# Patient Record
Sex: Female | Born: 1970 | Race: White | Hispanic: No | Marital: Married | Smoking: Current every day smoker
Health system: Southern US, Community
[De-identification: ages and names within clinical notes are randomized; demographics above are authoritative.]

## PROBLEM LIST (undated history)

## (undated) ENCOUNTER — Emergency Department (HOSPITAL_COMMUNITY): Admission: EM | Payer: Managed Care, Other (non HMO)

## (undated) DIAGNOSIS — Z87442 Personal history of urinary calculi: Secondary | ICD-10-CM

## (undated) DIAGNOSIS — F419 Anxiety disorder, unspecified: Secondary | ICD-10-CM

## (undated) DIAGNOSIS — I5022 Chronic systolic (congestive) heart failure: Secondary | ICD-10-CM

## (undated) DIAGNOSIS — Z720 Tobacco use: Secondary | ICD-10-CM

## (undated) DIAGNOSIS — F32A Depression, unspecified: Secondary | ICD-10-CM

## (undated) DIAGNOSIS — I10 Essential (primary) hypertension: Secondary | ICD-10-CM

## (undated) DIAGNOSIS — R011 Cardiac murmur, unspecified: Secondary | ICD-10-CM

## (undated) DIAGNOSIS — I5181 Takotsubo syndrome: Secondary | ICD-10-CM

## (undated) DIAGNOSIS — M069 Rheumatoid arthritis, unspecified: Secondary | ICD-10-CM

## (undated) DIAGNOSIS — M66239 Spontaneous rupture of extensor tendons, unspecified forearm: Secondary | ICD-10-CM

## (undated) DIAGNOSIS — I2510 Atherosclerotic heart disease of native coronary artery without angina pectoris: Secondary | ICD-10-CM

## (undated) DIAGNOSIS — I77810 Thoracic aortic ectasia: Secondary | ICD-10-CM

## (undated) DIAGNOSIS — M66249 Spontaneous rupture of extensor tendons, unspecified hand: Secondary | ICD-10-CM

## (undated) HISTORY — PX: OTHER SURGICAL HISTORY: SHX169

## (undated) HISTORY — PX: CARDIAC VALVE REPLACEMENT: SHX585

## (undated) HISTORY — DX: Thoracic aortic ectasia: I77.810

## (undated) HISTORY — DX: Atherosclerotic heart disease of native coronary artery without angina pectoris: I25.10

## (undated) HISTORY — DX: Takotsubo syndrome: I51.81

## (undated) HISTORY — DX: Anxiety disorder, unspecified: F41.9

## (undated) HISTORY — PX: CARDIAC CATHETERIZATION: SHX172

## (undated) HISTORY — DX: Chronic systolic (congestive) heart failure: I50.22

## (undated) HISTORY — DX: Cardiac murmur, unspecified: R01.1

## (undated) HISTORY — DX: Rheumatoid arthritis, unspecified: M06.9

## (undated) HISTORY — PX: TUBAL LIGATION: SHX77

---

## 2005-10-01 ENCOUNTER — Emergency Department: Payer: Self-pay | Admitting: Emergency Medicine

## 2005-10-01 ENCOUNTER — Other Ambulatory Visit: Payer: Self-pay

## 2009-04-21 HISTORY — PX: CYSTOSCOPY WITH URETEROSCOPY, STONE BASKETRY AND STENT PLACEMENT: SHX6378

## 2009-10-01 ENCOUNTER — Observation Stay: Payer: Self-pay | Admitting: Urology

## 2009-10-08 ENCOUNTER — Ambulatory Visit: Payer: Self-pay | Admitting: Urology

## 2009-10-11 ENCOUNTER — Ambulatory Visit: Payer: Self-pay | Admitting: Urology

## 2009-10-24 ENCOUNTER — Ambulatory Visit: Payer: Self-pay | Admitting: General Practice

## 2009-10-31 ENCOUNTER — Ambulatory Visit: Payer: Self-pay | Admitting: General Practice

## 2009-11-14 ENCOUNTER — Ambulatory Visit: Payer: Self-pay | Admitting: General Practice

## 2011-10-06 IMAGING — CT CT STONE STUDY
1 of 2 series · 15 of 32 positions shown, 19 images · non-contrast
Comparison: none

REASON FOR EXAM: Rt. flank pain severe with difficulty urinating
COMMENTS:

[Series 2: stone · axial · 0.59mm/px · z∈[-467,-107]mm · 15 of 135 slices shown, 19 images]
[im 10/135  soft-tissue]
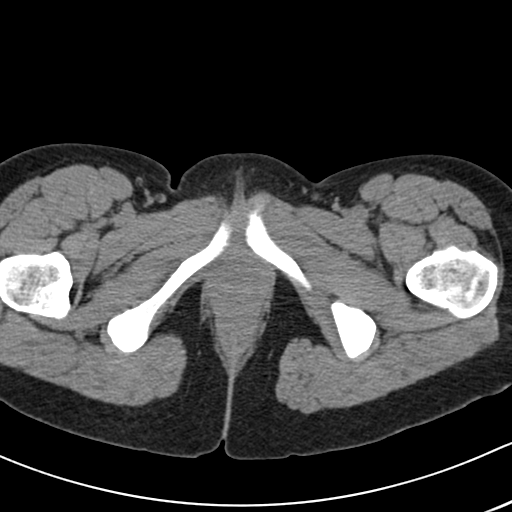
[im 10/135  bone]
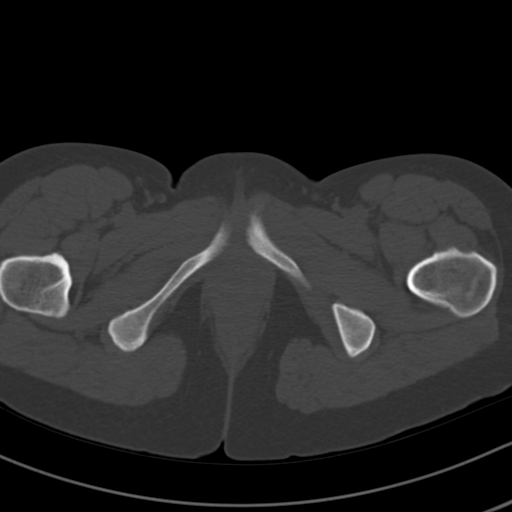
[im 20/135  soft-tissue]
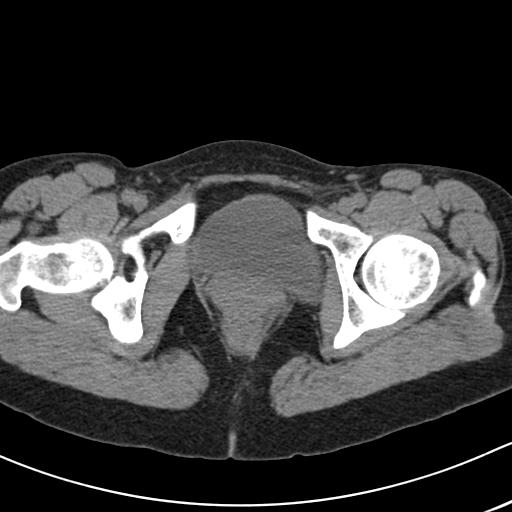
[im 29/135  soft-tissue]
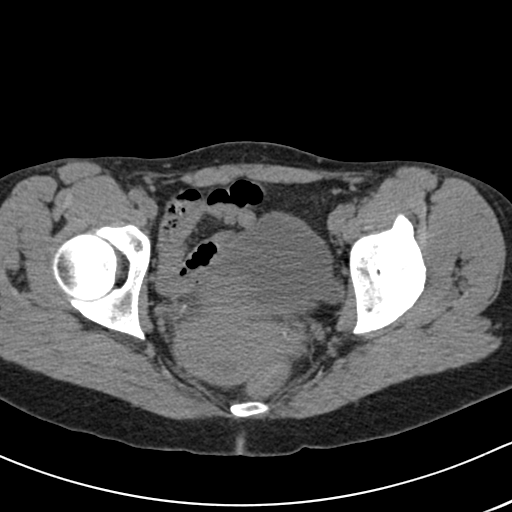
[im 39/135  soft-tissue]
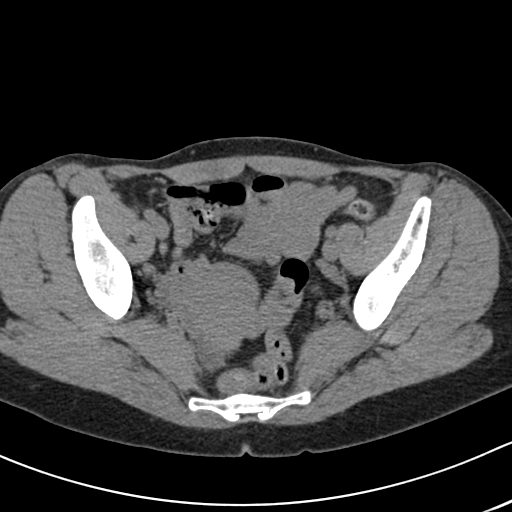
[im 48/135  soft-tissue]
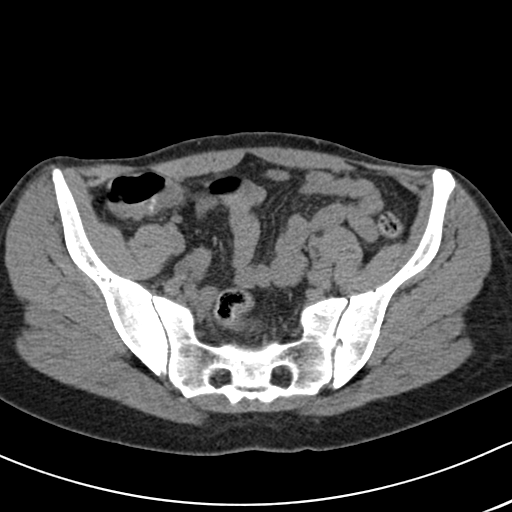
[im 58/135  soft-tissue]
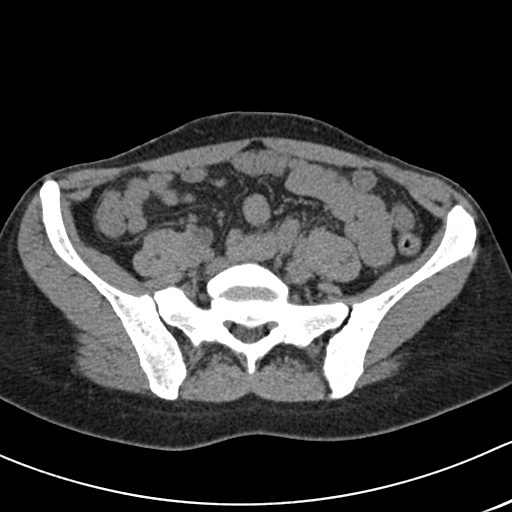
[im 68/135  soft-tissue]
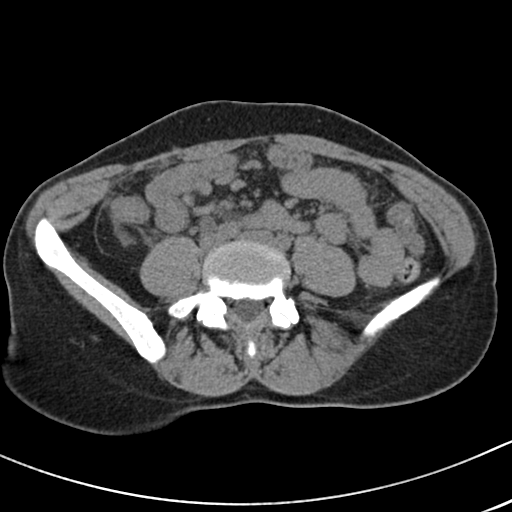
[im 77/135  soft-tissue]
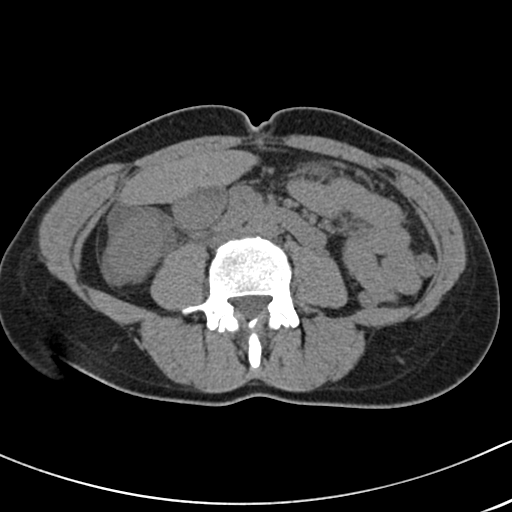
[im 87/135  soft-tissue]
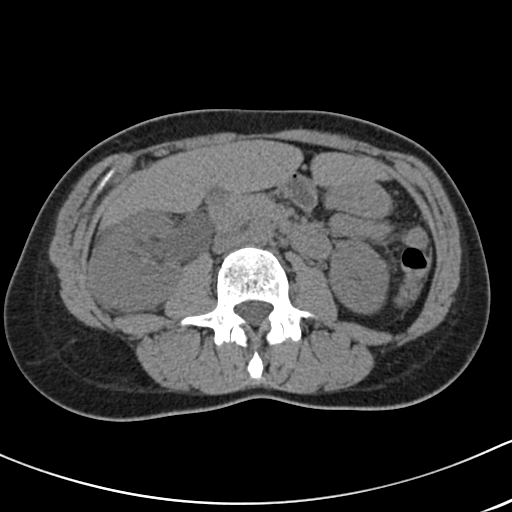
[im 87/135  bone]
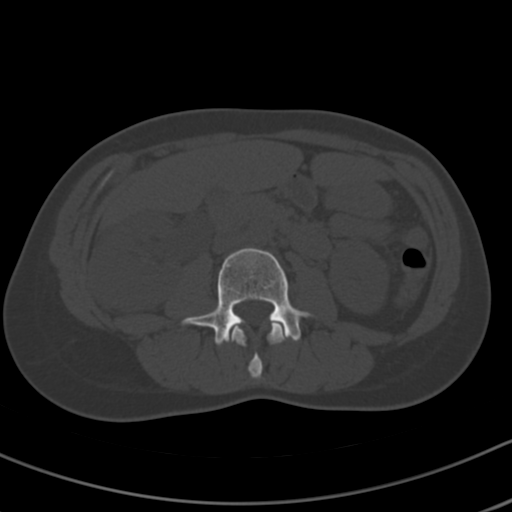
[im 96/135  soft-tissue]
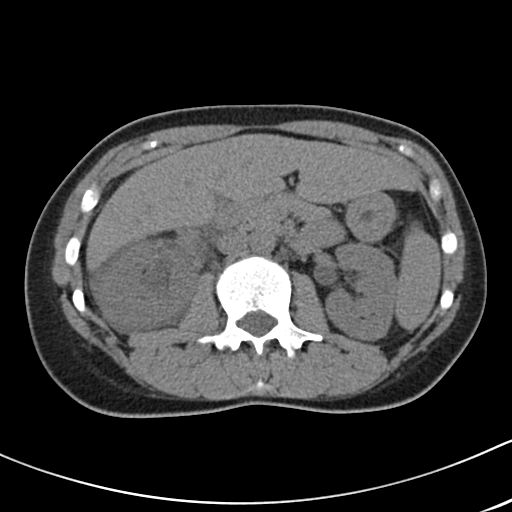
[im 106/135  soft-tissue]
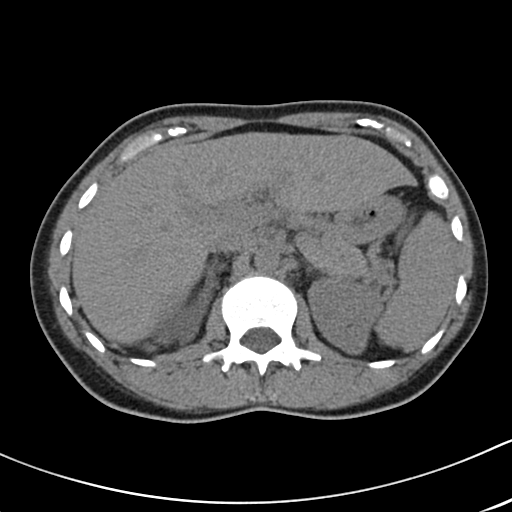
[im 115/135  soft-tissue]
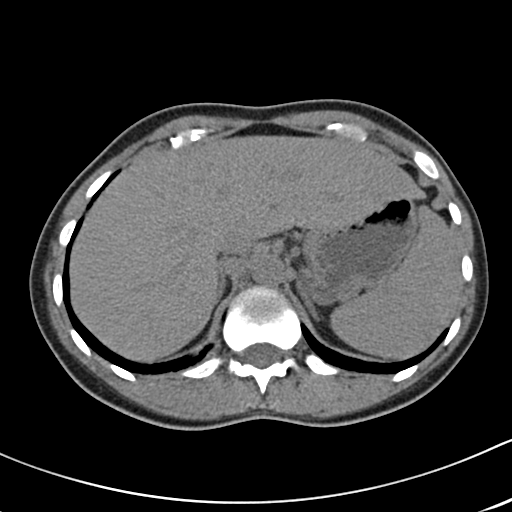
[im 115/135  lung]
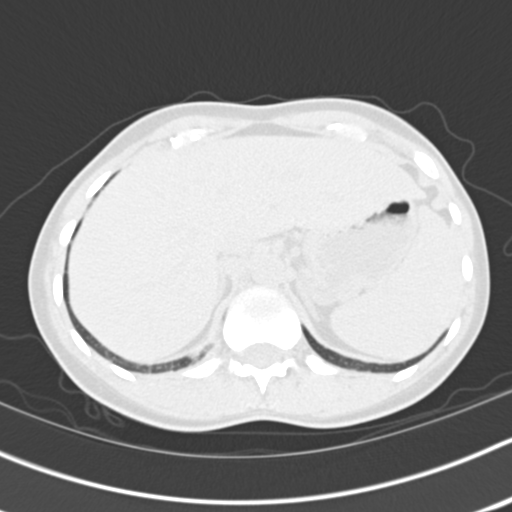
[im 120/135  lung]
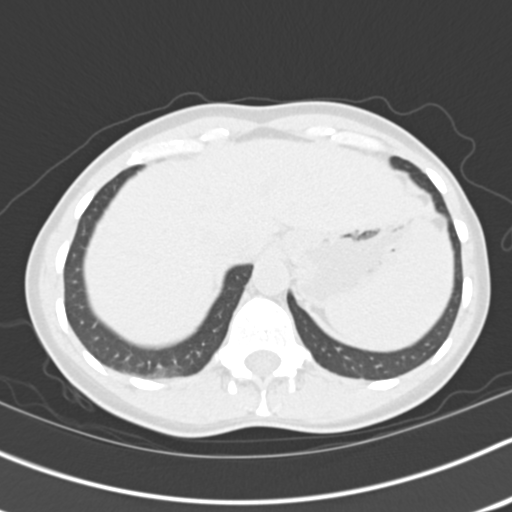
[im 125/135  soft-tissue]
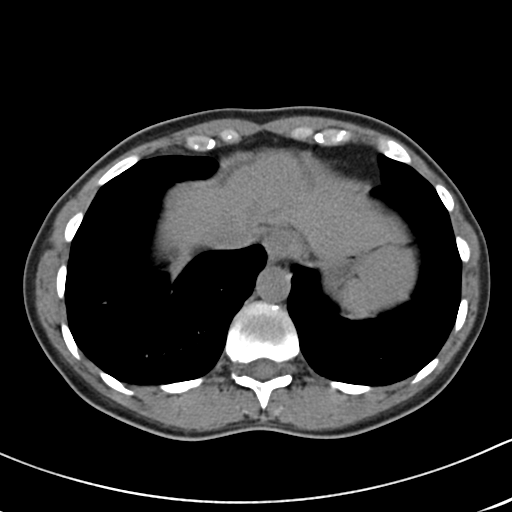
[im 125/135  lung]
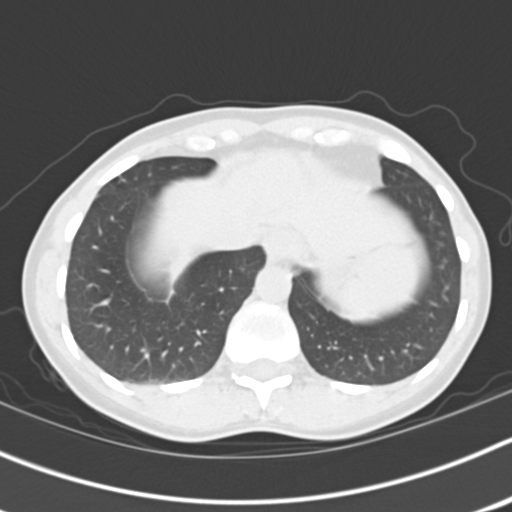
[im 130/135  lung]
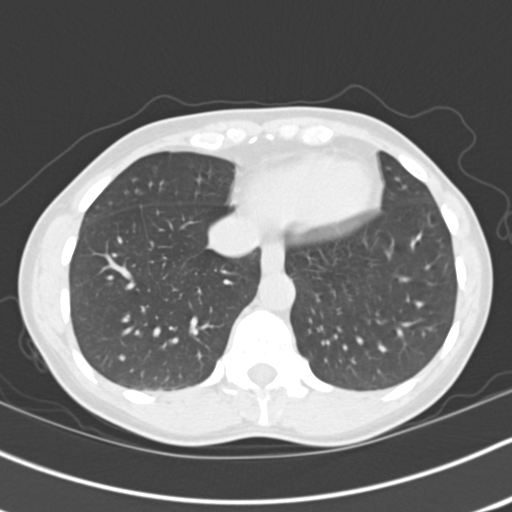

[15 of 32 positions shown; findings below may reference images not displayed]

PROCEDURE:     CT  - CT ABDOMEN /PELVIS WO (STONE)  - September 30, 2009 [DATE]

RESULT:     Axial noncontrast CT scanning was performed through the abdomen
and pelvis at 3 mm intervals and slice thicknesses.

On the right there is moderate to severe hydronephrosis. There is increased
density in the perinephric fat with evidence of fluid. The right ureter is
moderately dilated to the level of the ureterovesical junction. There is a
calcified stone just proximal to the ureterovesical junction demonstrated
best on image 108. This stone measures 3 x 6 mm. The urinary bladder is
partially distended and grossly normal.

The left kidney exhibits no evidence of obstruction or stones. The liver,
spleen, pancreas, partially distended stomach, and adrenal glands are normal
in appearance for the noncontrast study. The caliber of the abdominal aorta
is normal. The unopacified loops of small and large bowel exhibit no
evidence of obstruction. There is a structure demonstrated which may reflect
a normal appendix in the right aspect of the pelvis. The uterus and adnexal
structures appear within the limits of normal for age. I cannot exclude
cysts in the left ovary. The lung bases are clear. The lumbar vertebral
bodies are preserved in height.
IMPRESSION: 1. There is moderate to severe hydronephrosis and hydroureter on the right
secondary to a 6 mm diameter stone just proximal to the right ureterovesical
junction. There is perinephric fluid on the right. I see no stones or
obstruction on the left.
2. I see no acute hepatobiliary abnormality nor acute bowel abnormality.

A preliminary report was sent to the [HOSPITAL] the conclusion
of the study.

## 2011-10-17 IMAGING — CR DG ABDOMEN 1V
1 series · 2 of 2 positions shown · non-contrast
Comparison: none

REASON FOR EXAM: renal calculi-lithotripsy
COMMENTS:

[Series 1: view not recorded · 0.17mm/px · 2 of 2 slices shown]
[im 1/2]
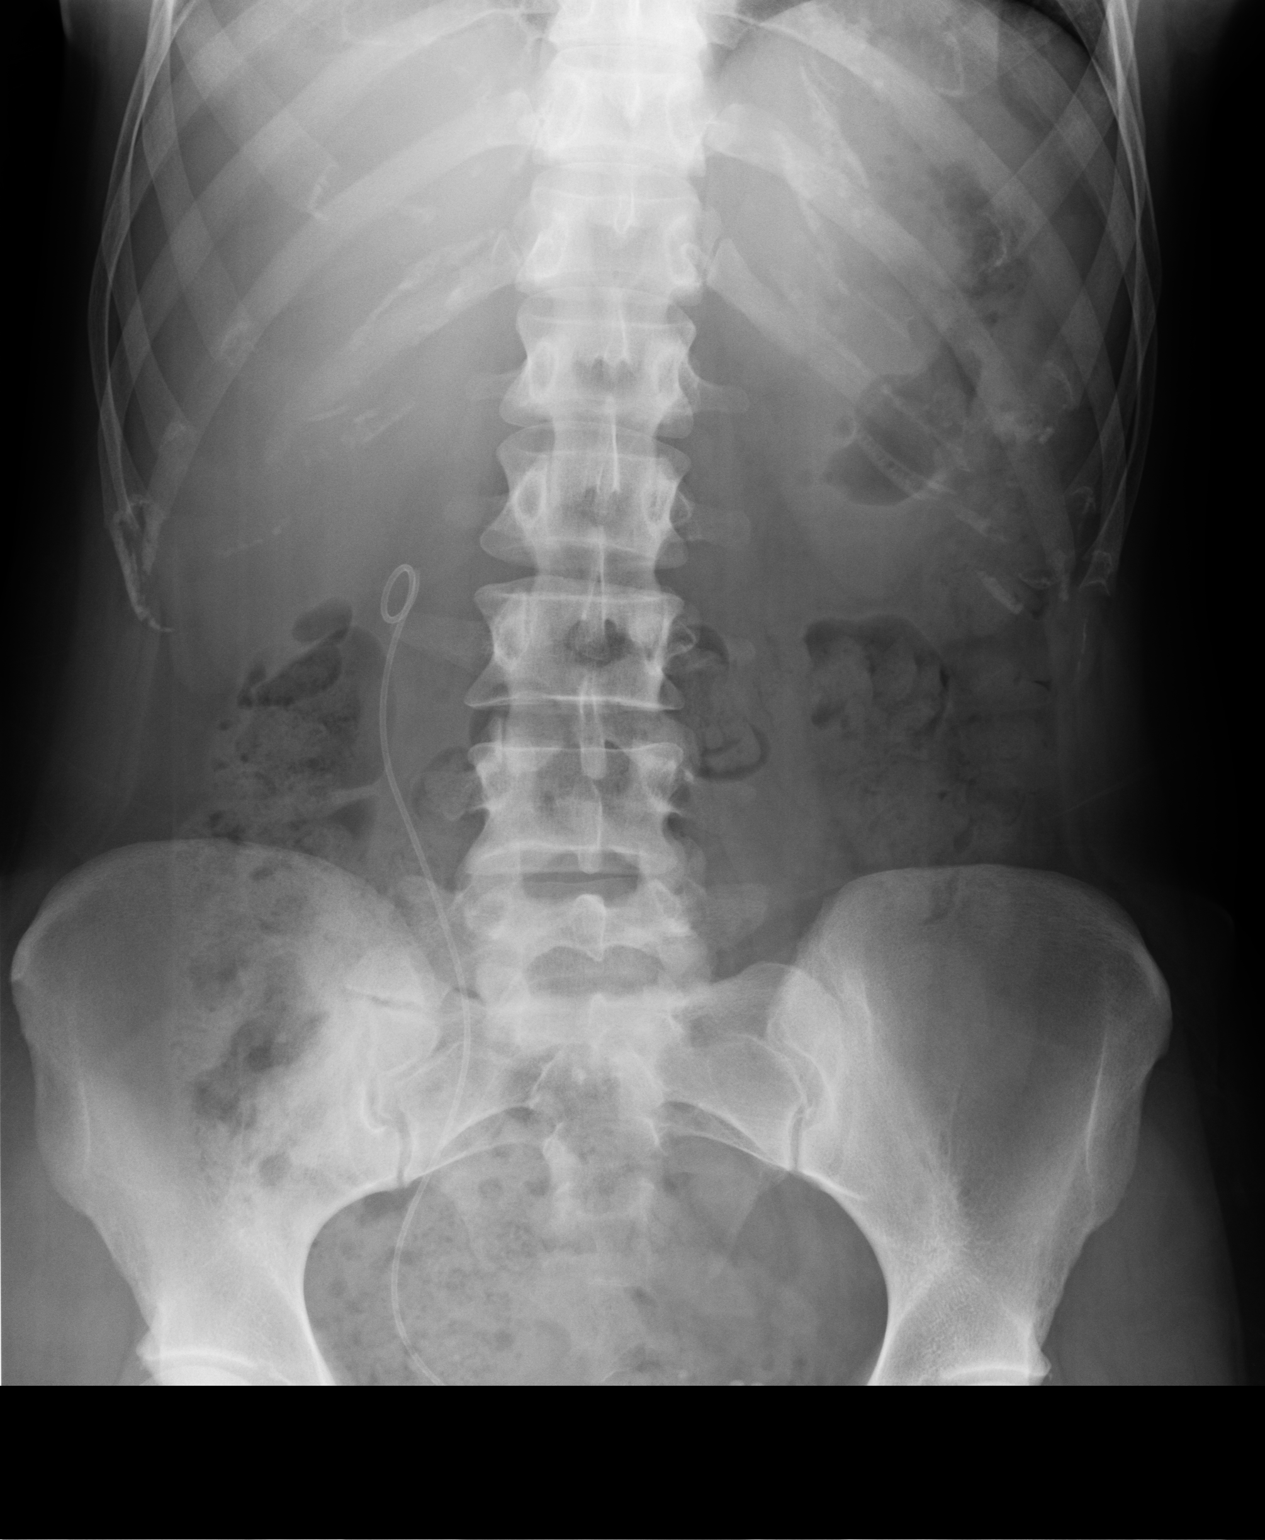
[im 2/2]
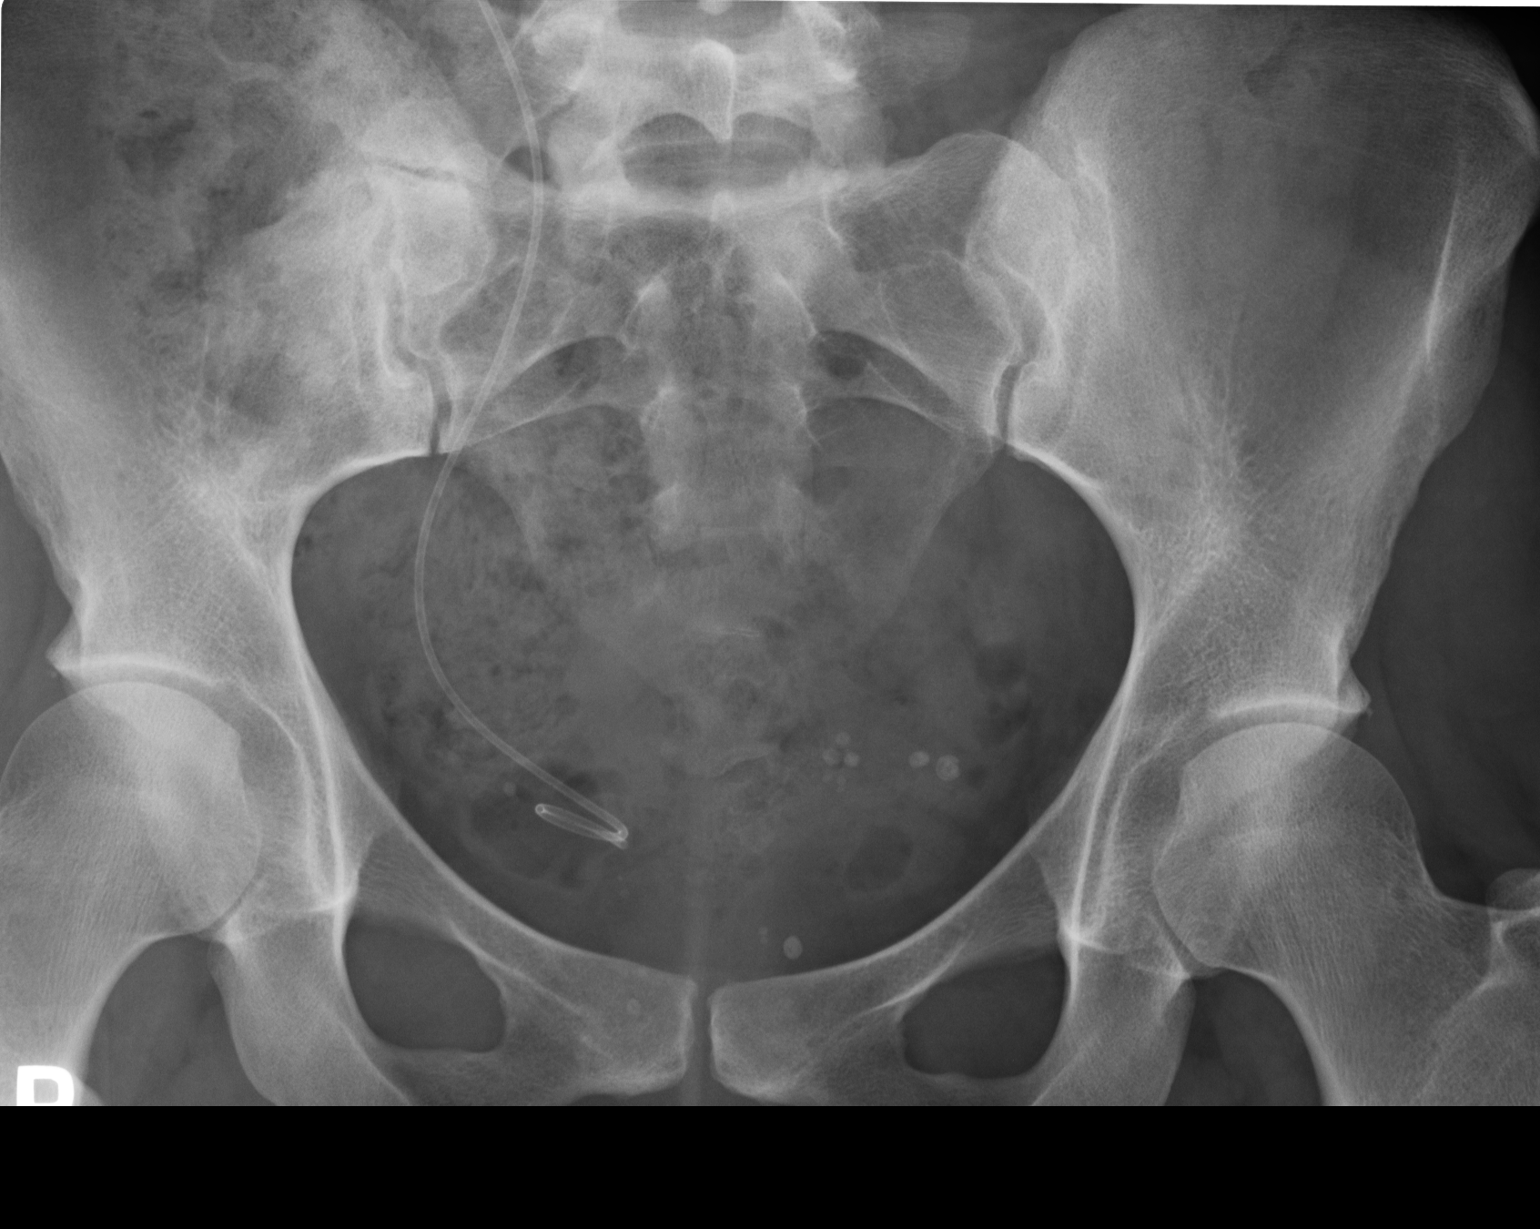

[2 of 2 positions shown; findings below may reference images not displayed]

PROCEDURE:     DXR - DXR KIDNEY URETER BLADDER  - October 11, 2009  [DATE]

RESULT:     Comparison is made to the prior exam of 10/08/2009. A double-J
right ureteral stent is again observed. There is a 5.2 mm density projected
along the inferior aspect of the stent and likely represents a distal right
ureteral stone that has moved inferiorly by less than a centimeter as
compared to the prior exam. No intrarenal calcifications are seen.
Phleboliths are noted in the left pelvis. No left ureteral stones are seen.
IMPRESSION: There is again noted a faint density adjacent to the distal
portion of the right ureteral stent and consistent with a distal right
ureteral stone.

## 2013-02-25 ENCOUNTER — Encounter: Payer: Self-pay | Admitting: Adult Health

## 2013-02-25 ENCOUNTER — Ambulatory Visit (INDEPENDENT_AMBULATORY_CARE_PROVIDER_SITE_OTHER): Payer: BC Managed Care – PPO | Admitting: Adult Health

## 2013-02-25 VITALS — BP 132/80 | HR 66 | Temp 98.1°F | Resp 12 | Ht 66.5 in | Wt 124.5 lb

## 2013-02-25 DIAGNOSIS — Z0000 Encounter for general adult medical examination without abnormal findings: Secondary | ICD-10-CM

## 2013-02-25 DIAGNOSIS — Z1239 Encounter for other screening for malignant neoplasm of breast: Secondary | ICD-10-CM

## 2013-02-25 DIAGNOSIS — Z1231 Encounter for screening mammogram for malignant neoplasm of breast: Secondary | ICD-10-CM | POA: Insufficient documentation

## 2013-02-25 NOTE — Progress Notes (Signed)
  Subjective:    Patient ID: Isabella Burch, female    DOB: 02-Jun-1970, 42 y.o.   MRN: [id number]  HPI  Pt is a pleasant 42 yo female, presents to clinic to establish care. Pt has not had a prior PCP. Pt sees Dr. Gavin Potters for rheumatoid arthritis who recommended she establish care with a PCP.  Pt reports swelling in bilateral hands from RA but denies pain. Pt denies any acute concerns. Pt has never had a mammogram, pt is unsure of when her last PAP smear was-thinks it was probably 18 years ago after her last child was born.  Review of Systems  Constitutional: Negative for fever, activity change, fatigue and unexpected weight change.  HENT: Negative.   Eyes: Negative.   Respiratory: Negative.  Negative for cough, shortness of breath and wheezing.   Cardiovascular: Negative.  Negative for chest pain.  Gastrointestinal: Negative.  Negative for nausea, vomiting, abdominal pain, diarrhea and constipation.  Endocrine: Negative.  Negative for cold intolerance, heat intolerance, polydipsia, polyphagia and polyuria.  Genitourinary: Negative.  Negative for difficulty urinating.  Musculoskeletal: Positive for joint swelling.       Swelling in bilateral hands 2/2 RA  Skin: Negative.   Allergic/Immunologic: Negative.   Neurological: Negative.  Negative for dizziness, light-headedness and headaches.  Hematological: Negative.   Psychiatric/Behavioral: Negative.        Objective:   Physical Exam  Constitutional: She is oriented to person, place, and time. She appears well-developed and well-nourished.  HENT:  Head: Normocephalic and atraumatic.  Right Ear: External ear normal.  Left Ear: External ear normal.  Nose: Nose normal.  Mouth/Throat: Oropharynx is clear and moist and mucous membranes are normal.  Eyes: Conjunctivae and EOM are normal. Pupils are equal, round, and reactive to light.  Neck: Normal range of motion. Neck supple.  Cardiovascular: Normal rate, regular rhythm, normal heart sounds,  intact distal pulses and normal pulses.   Pulmonary/Chest: Effort normal and breath sounds normal. She has no wheezes. She has no rales.  Abdominal: Soft. Normal appearance and bowel sounds are normal. There is no tenderness.  Musculoskeletal: Normal range of motion.       Right hand: She exhibits swelling.       Left hand: She exhibits swelling.  Neurological: She is alert and oriented to person, place, and time. She has normal reflexes. Coordination and gait normal.  Skin: Skin is warm and dry.  Psychiatric: She has a normal mood and affect. Her speech is normal and behavior is normal. Judgment and thought content normal.          Assessment & Plan:

## 2013-02-25 NOTE — Assessment & Plan Note (Signed)
Physical exam normal excluding breast and pelvic/pap. Patient will schedule GYN exam. Request labs and records from Dr. Gavin Potters. Ordered Mammogram.

## 2013-02-25 NOTE — Progress Notes (Signed)
Pre-visit discussion using our clinic review tool. No additional management support is needed unless otherwise documented below in the visit note.  

## 2013-02-25 NOTE — Patient Instructions (Signed)
   Thank you for choosing [facility] for your health care needs.  I will request your medical records from Dr. Gavin Potters.  Remember to activate your MyChart account.  I have ordered for you to have a mammogram at [facility]. Please schedule this appointment at your earliest convenience.

## 2013-02-25 NOTE — Progress Notes (Signed)
Subjective:    Patient ID: Isabella Burch, female    DOB: 04-28-1970, 42 y.o.   MRN: [id number]  HPI  Patient is a pleasant 42 year old female who presents to clinic to establish care. She has not had a PCP prior to this. She has been followed by Dr. Gavin Potters for rheumatoid arthritis which was diagnosed in 2006. Patient is doing well. She has no concerns this visit. She has not had a mammogram. Last Pap smear was approximately 18 years ago.     Past Medical History  Diagnosis Date  . Heart murmur   . UTI (lower urinary tract infection)   . Kidney stone   . Rheumatoid arthritis 2006     Past Surgical History  Procedure Laterality Date  . Hand surgery Left 2012     Family History  Problem Relation Age of Onset  . Hyperlipidemia Father   . Hypertension Father   . Diabetes Father   . Hypothyroidism Mother      History   Social History  . Marital Status: Married    Spouse Name: Hend Mccarrell    Number of Children: 2  . Years of Education: 12   Occupational History  . 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 [employer]     Part-time job   Social History Main Topics  . Smoking status: Current Every Day Smoker -- 0.25 packs/day for 25 years    Types: Cigarettes  . Smokeless tobacco: Never Used  . Alcohol Use: No  . Drug Use: No  . Sexual Activity: Not on file   Other Topics Concern  . Not on file   Social History Narrative   Promise grew up in [place]. She lives at home with her husband, two children and a granddaughter. They have 2 dogs (great pyreneees and dachbull).  She works for the [employer] system in [employer] and also works part time at the [employer] in [employer]. She enjoys working and being around children.        Review of Systems  Constitutional: Negative.   HENT: Negative.   Eyes: Negative.   Respiratory: Negative.   Cardiovascular: Negative.   Gastrointestinal: Negative.   Endocrine:  Negative.   Genitourinary: Negative.   Musculoskeletal: Positive for joint swelling.       Hx of RA on DMARDs  Skin: Negative.   Allergic/Immunologic: Negative.   Neurological: Negative.   Hematological: Negative.   Psychiatric/Behavioral: Negative.        Objective:   Physical Exam  Constitutional: She is oriented to person, place, and time. She appears well-developed and well-nourished. No distress.  HENT:  Head: Normocephalic and atraumatic.  Eyes: Conjunctivae and EOM are normal. Pupils are equal, round, and reactive to light.  Neck: Normal range of motion. Neck supple. No tracheal deviation present.  Cardiovascular: Normal rate, regular rhythm, normal heart sounds and intact distal pulses.  Exam reveals no gallop and no friction rub.   No murmur heard. Pulmonary/Chest: Effort normal and breath sounds normal. No respiratory distress. She has no wheezes. She has no rales.  Abdominal: Soft. Bowel sounds are normal. She exhibits no distension and no mass. There is no tenderness. There is no rebound and no guarding.  Musculoskeletal: Normal range of motion.  Joint swelling bilateral wrist with effusions. Followed by Dr. Gavin Potters  Neurological: She is alert and oriented to person, place, and time. She has normal reflexes. Coordination normal.  Skin: Skin is warm and  dry.  Psychiatric: She has a normal mood and affect. Her behavior is normal. Judgment and thought content normal.          Assessment & Plan:

## 2013-03-08 ENCOUNTER — Encounter: Payer: Self-pay | Admitting: Emergency Medicine

## 2014-09-26 ENCOUNTER — Emergency Department
Admission: EM | Admit: 2014-09-26 | Discharge: 2014-09-26 | Disposition: A | Discharge status: Home / Self Care | Payer: BC Managed Care – PPO | Attending: Emergency Medicine | Admitting: Emergency Medicine

## 2014-09-26 ENCOUNTER — Encounter: Payer: Self-pay | Admitting: Emergency Medicine

## 2014-09-26 ENCOUNTER — Telehealth: Payer: Self-pay | Admitting: Adult Health

## 2014-09-26 ENCOUNTER — Other Ambulatory Visit: Payer: Self-pay

## 2014-09-26 DIAGNOSIS — I158 Other secondary hypertension: Secondary | ICD-10-CM

## 2014-09-26 DIAGNOSIS — Z720 Tobacco use: Secondary | ICD-10-CM | POA: Insufficient documentation

## 2014-09-26 DIAGNOSIS — I10 Essential (primary) hypertension: Secondary | ICD-10-CM | POA: Diagnosis present

## 2014-09-26 HISTORY — DX: Essential (primary) hypertension: I10

## 2014-09-26 LAB — BASIC METABOLIC PANEL
ANION GAP: 6 (ref 5–15)
BUN: 10 mg/dL (ref 6–20)
CO2: 24 mmol/L (ref 22–32)
CREATININE: 0.73 mg/dL (ref 0.44–1.00)
Calcium: 8.3 mg/dL — ABNORMAL LOW (ref 8.9–10.3)
Chloride: 109 mmol/L (ref 101–111)
GFR calc non Af Amer: >60 mL/min (ref >60)
Glucose, Bld: 111 mg/dL — ABNORMAL HIGH (ref 65–99)
Potassium: 3.8 mmol/L (ref 3.5–5.1)
Sodium: 139 mmol/L (ref 135–145)

## 2014-09-26 LAB — CBC
HCT: 39.3 % (ref 35.0–47.0)
HEMOGLOBIN: 13.5 g/dL (ref 12.0–16.0)
MCH: 32.6 pg (ref 26.0–34.0)
MCHC: 34.3 g/dL (ref 32.0–36.0)
MCV: 95.2 fL (ref 80.0–100.0)
Platelets: 243 10*3/uL (ref 150–440)
RBC: 4.13 MIL/uL (ref 3.80–5.20)
RDW: 13.8 % (ref 11.5–14.5)
WBC: 4.9 10*3/uL (ref 3.6–11.0)

## 2014-09-26 LAB — TROPONIN I

## 2014-09-26 MED ORDER — DIAZEPAM 5 MG PO TABS: 5.0000 mg | ORAL_TABLET | Freq: Three times a day (TID) | ORAL | Status: DC | PRN

## 2014-09-26 NOTE — ED Notes (Signed)
Pt to ed with c/o elevated blood pressure for several days.  Pt states has never been on BP meds before but went to see PMD and was told it was elevated.  Pt denies chest pain, denies sob, does state headache on Sunday.  Pt appears in nad at this time.

## 2014-09-26 NOTE — Telephone Encounter (Signed)
FYI Spoke with pts daughter.  She state pt is on her way to [facility] ER.  Pt has not been seen since Raquel left.

## 2014-09-26 NOTE — Telephone Encounter (Signed)
Patient Name: Isabella Burch  DOB: 1971/02/22    Initial Comment Caller states her BP is 170/100   Nurse Assessment  Nurse: Gasper Sells, RN, Marylu Lund Date/Time Lamount Cohen Time): 09/26/2014 4:11:52 PM  Confirm and document reason for call. If symptomatic, describe symptoms. ---Caller states her BP is 170/100. R thumb tendon ruptured and having surgery. Yesterday 215/120 at the Dr. She has RA. Pain comes and goes.  Has the patient traveled out of the country within the last 30 days? ---Not Applicable  Does the patient require triage? ---Yes  Related visit to physician within the last 2 weeks? ---Yes  Does the PT have any chronic conditions? (i.e. diabetes, asthma, etc.) ---Yes  List chronic conditions. ---RA  Did the patient indicate they were pregnant? ---No     Guidelines    Guideline Title Affirmed Question Affirmed Notes  High Blood Pressure [1] BP ? 160 / 100 AND [2] cardiac or neurologic symptoms (e.g., chest pain, difficulty breathing, unsteady gait, blurred vision)    Final Disposition User   Go to ED Now Gasper Sells, Charity fundraiser, Marylu Lund

## 2014-09-26 NOTE — ED Notes (Signed)
Pt presents to ED with c/o elevated BP. Pt reports starting Abatacept recently for RA and is concerned that it may be responsible for her elevated BP.

## 2014-09-26 NOTE — Discharge Instructions (Signed)

## 2014-09-26 NOTE — ED Provider Notes (Signed)
[facility] Emergency Department Provider Note     Time seen: ----------------------------------------- 7:41 PM on 09/26/2014 -----------------------------------------    I have reviewed the triage vital signs and the nursing notes.   HISTORY  Chief Complaint Hypertension    HPI Isabella Burch is a 44 y.o. female who presents ER for elevated blood pressure. Patient reports recently starting medication for [place] arthritis and is concerned it may be response for elevated blood pressure. She's had some recent headache, but otherwise denies any other complaints. Has not been ill recently, is under a lot of stress she states. His never been on blood pressure medicine before, blood see her primary care doctor was told elevated. She denies chest pain or shortness of breath.   Past Medical History  Diagnosis Date  . Heart murmur   . UTI (lower urinary tract infection)   . Kidney stone   . Rheumatoid arthritis 2006  . Hypertension     Patient Active Problem List   Diagnosis Date Noted  . Routine general medical examination at a health care facility 02/25/2013    Past Surgical History  Procedure Laterality Date  . Hand surgery Left 2012    Allergies Enbrel and Humira  Social History History  Substance Use Topics  . Smoking status: Current Every Day Smoker -- 0.25 packs/day for 25 years    Types: Cigarettes  . Smokeless tobacco: Never Used  . Alcohol Use: No    Review of Systems Constitutional: Negative for fever. Eyes: Negative for visual changes. ENT: Negative for sore throat. Cardiovascular: Negative for chest pain. Respiratory: Negative for shortness of breath. Gastrointestinal: Negative for abdominal pain, vomiting and diarrhea. Genitourinary: Negative for dysuria. Musculoskeletal: Negative for back pain. Skin: Negative for rash. Neurological: Negative for headaches, focal weakness or numbness.  10-point ROS otherwise  negative.  ____________________________________________   PHYSICAL EXAM:  VITAL SIGNS: ED Triage Vitals  Enc Vitals Group     BP 09/26/14 1732 197/96 mmHg     Pulse Rate 09/26/14 1732 65     Resp 09/26/14 1732 18     Temp 09/26/14 1732 98.6 F (37 C)     Temp Source 09/26/14 1732 Oral     SpO2 09/26/14 1732 100 %     Weight 09/26/14 1732 130 lb (58.968 kg)     Height 09/26/14 1732 5\' 6"  (1.676 m)     Head Cir --      Peak Flow --      Pain Score 09/26/14 1734 3     Pain Loc --      Pain Edu? --      Excl. in GC? --     Constitutional: Alert and oriented. Well appearing and in no distress. Eyes: Conjunctivae are normal. PERRL. Normal extraocular movements. ENT   Head: Normocephalic and atraumatic.   Nose: No congestion/rhinnorhea.   Mouth/Throat: Mucous membranes are moist.   Neck: No stridor. Hematological/Lymphatic/Immunilogical: No cervical lymphadenopathy. Cardiovascular: Normal rate, regular rhythm. Normal and symmetric distal pulses are present in all extremities. No murmurs, rubs, or gallops. Respiratory: Normal respiratory effort without tachypnea nor retractions. Breath sounds are clear and equal bilaterally. No wheezes/rales/rhonchi. Gastrointestinal: Soft and nontender. No distention. No abdominal bruits. There is no CVA tenderness. Musculoskeletal: Nontender with normal range of motion in all extremities. No joint effusions.  No lower extremity tenderness nor edema. Neurologic:  Normal speech and language. No gross focal neurologic deficits are appreciated. Speech is normal. No gait instability. Skin:  Skin is  warm, dry and intact. No rash noted. Psychiatric: Mood and affect are normal. Speech and behavior are normal. Patient exhibits appropriate insight and judgment. ____________________________________________  EKG: Interpreted by me. Normal sinus rhythm, rate is 61, unusual P axis, otherwise normal intervals no evidence of hypertrophy or acute  infarction.  ____________________________________________  ED COURSE:  Pertinent labs & imaging results that were available during my care of the patient were reviewed by me and considered in my medical decision making (see chart for details).  ____________________________________________    LABS (pertinent positives/negatives)  Labs Reviewed  BASIC METABOLIC PANEL - Abnormal; Notable for the following:    Glucose, Bld 111 (*)    Calcium 8.3 (*)    All other components within normal limits  CBC  TROPONIN I    RADIOLOGY  None  ____________________________________________  FINAL ASSESSMENT AND PLAN  Hypertension  Plan: Blood pressure could certainly be elevated from the rheumatoid arthritis medication. Up to 10% of patients on this medication have high blood pressure. We'll prescribe Valium for anxiety in case this is a component, advised follow-up with her rheumatologist for reevaluation.   Emily Filbert, MD   Emily Filbert, MD 09/26/14 [phone]

## 2014-09-29 ENCOUNTER — Telehealth: Payer: Self-pay | Admitting: Nurse Practitioner

## 2014-09-29 NOTE — Telephone Encounter (Signed)
FYI

## 2014-09-29 NOTE — Telephone Encounter (Signed)
Patient Name: ZORIANNA TALIAFERRO DOB: [birth year] Initial Comment Caller states her BP 215/120 on Monday. She went to the ER. Not sure what is running it up that high. She doesn't know what it is. Some tightness in her chest, headaches. Nurse Assessment Nurse: Yetta Barre, RN, Miranda Date/Time (Eastern Time): 09/29/2014 12:36:25 PM Confirm and document reason for call. If symptomatic, describe symptoms. ---Caller states she was seen in the ED on Monday because of high blood pressure and told they felt it was related to her arthritis medication. She received a call back from her rheumatologist today who said they did not feel it was related to her meds. She does not have a way to check her BP right now. Right now she has a slight headache and has occasional feelings of "anxiety/ tightness in her chest". Has the patient traveled out of the country within the last 30 days? ---Not Applicable Does the patient require triage? ---Yes Related visit to physician within the last 2 weeks? ---Yes Does the PT have any chronic conditions? (i.e. diabetes, asthma, etc.) ---Yes List chronic conditions. ---RA Did the patient indicate they were pregnant? ---No Guidelines Guideline Title Affirmed Question Affirmed Notes High Blood Pressure [1] BP # 140/90 AND [2] not taking BP medications Final Disposition User See PCP within 2 Olen Pel, RN, Miranda Comments Appt schedule with Naomie Dean, NP, on Monday 6/13, 1130.

## 2014-10-02 ENCOUNTER — Ambulatory Visit: Payer: Self-pay | Admitting: Nurse Practitioner

## 2014-10-03 ENCOUNTER — Encounter: Payer: Self-pay | Admitting: Nurse Practitioner

## 2014-10-03 ENCOUNTER — Ambulatory Visit (INDEPENDENT_AMBULATORY_CARE_PROVIDER_SITE_OTHER): Payer: BC Managed Care – PPO | Admitting: Nurse Practitioner

## 2014-10-03 VITALS — BP 154/94 | HR 71 | Temp 98.2°F | Resp 12 | Ht 66.5 in | Wt 129.4 lb

## 2014-10-03 DIAGNOSIS — I10 Essential (primary) hypertension: Secondary | ICD-10-CM

## 2014-10-03 MED ORDER — HYDROCHLOROTHIAZIDE 12.5 MG PO TABS: 12.5000 mg | ORAL_TABLET | Freq: Every day | ORAL | Status: DC

## 2014-10-03 NOTE — Progress Notes (Signed)
Pre visit review using our clinic review tool, if applicable. No additional management support is needed unless otherwise documented below in the visit note. 

## 2014-10-03 NOTE — Progress Notes (Signed)
   Subjective:    Patient ID: Isabella Burch, female    DOB: 1970/07/23, 44 y.o.   MRN: [id number]  HPI  Isabella Burch is a 44 yo female with a CC of HTN.   1) [facility] ED on 6/7, BP elevated 154/94 today   EKG- NSR, no signs of hypertrophy or infarction  BP 215/120 at surgeon's office, she reported to [facility]; anxiety and   chest tightness   Dr. Gavin Potters- RA 2006 dx Abatacept 125 inj- stopped 2 weeks prior to surgery  Leflunomide 10 mg  Naproxen 500 mg daily Not taking valium   Stopped the Naproxen 6/10 approx.   Last Potassium 3.8   Review of Systems  Constitutional: Negative for fever, chills, diaphoresis and fatigue.  Respiratory: Negative for chest tightness, shortness of breath and wheezing.   Cardiovascular: Negative for chest pain, palpitations and leg swelling.  Gastrointestinal: Negative for nausea, vomiting and diarrhea.  Skin: Negative for rash.  Neurological: Negative for dizziness, weakness, numbness and headaches.  Psychiatric/Behavioral: The patient is nervous/anxious.       Objective:   Physical Exam  Constitutional: She is oriented to person, place, and time. She appears well-developed and well-nourished. No distress.  BP 154/94 mmHg  Pulse 71  Temp(Src) 98.2 F (36.8 C) (Oral)  Resp 12  Ht 5' 6.5" (1.689 m)  Wt 129 lb 6.4 oz (58.695 kg)  BMI 20.58 kg/m2  SpO2 98%  LMP 09/19/2014 (Approximate)   HENT:  Head: Normocephalic and atraumatic.  Right Ear: External ear normal.  Left Ear: External ear normal.  Cardiovascular: Normal rate, regular rhythm, normal heart sounds and intact distal pulses.  Exam reveals no gallop and no friction rub.   No murmur heard. Pulmonary/Chest: Effort normal and breath sounds normal. No respiratory distress. She has no wheezes. She has no rales. She exhibits no tenderness.  Neurological: She is alert and oriented to person, place, and time. No cranial nerve deficit. She exhibits normal muscle tone. Coordination normal.  Skin: Skin is  warm and dry. No rash noted. She is not diaphoretic.  Psychiatric: She has a normal mood and affect. Her behavior is normal. Judgment and thought content normal.      Assessment & Plan:

## 2014-10-03 NOTE — Patient Instructions (Addendum)
Take in the morning- may make you urinate more at first.   Please make a nurse visit (no copay) on Thursday to have your BP checked.   Please call your surgeon office and tell them you have a new medication on board and that you will have a Blood Pressure check on Thursday.   Follow up in 2 months otherwise for labs and checking on BP.

## 2014-10-05 ENCOUNTER — Ambulatory Visit (INDEPENDENT_AMBULATORY_CARE_PROVIDER_SITE_OTHER): Payer: BC Managed Care – PPO

## 2014-10-05 VITALS — BP 118/78 | HR 72

## 2014-10-05 DIAGNOSIS — I10 Essential (primary) hypertension: Secondary | ICD-10-CM

## 2014-10-05 NOTE — Progress Notes (Signed)
Patient came in for Bp check.  Patients BP is 118/78 in right arm.  Patient wanted to make sure that she can plan to have her hand surgery next week.  Please advise.

## 2014-10-08 DIAGNOSIS — I10 Essential (primary) hypertension: Secondary | ICD-10-CM | POA: Insufficient documentation

## 2014-10-08 NOTE — Assessment & Plan Note (Signed)
Pt needs to have her BP regulated before getting her surgery. Never taken medication before. Discussed options and decided to go with HCTZ 12.5 mg daily. Nurse visit for BP check 1 week after starting.

## 2014-10-18 NOTE — Progress Notes (Signed)
Patient ID: TISH BEGIN, female   DOB: 1970-09-16, 44 y.o.   MRN: [id number]  I have reviewed the nurse visit by Murlean Hark, RN and agree with her findings. Plan- okay to proceed for hand surgery, pt notified.   Naomie Dean, NP-C 10/18/14 [phone]

## 2014-11-07 ENCOUNTER — Encounter (HOSPITAL_BASED_OUTPATIENT_CLINIC_OR_DEPARTMENT_OTHER): Payer: Self-pay | Admitting: *Deleted

## 2014-11-09 ENCOUNTER — Encounter (HOSPITAL_BASED_OUTPATIENT_CLINIC_OR_DEPARTMENT_OTHER): Payer: Self-pay

## 2014-11-09 ENCOUNTER — Ambulatory Visit (HOSPITAL_BASED_OUTPATIENT_CLINIC_OR_DEPARTMENT_OTHER): Payer: BC Managed Care – PPO | Admitting: Anesthesiology

## 2014-11-09 ENCOUNTER — Encounter (HOSPITAL_BASED_OUTPATIENT_CLINIC_OR_DEPARTMENT_OTHER)
Admission: RE | Disposition: A | Discharge status: Home / Self Care | Payer: Self-pay | Source: Ambulatory Visit | Attending: Orthopedic Surgery

## 2014-11-09 ENCOUNTER — Ambulatory Visit (HOSPITAL_BASED_OUTPATIENT_CLINIC_OR_DEPARTMENT_OTHER)
Admission: RE | Admit: 2014-11-09 | Discharge: 2014-11-09 | Disposition: A | Discharge status: Home / Self Care | Payer: BC Managed Care – PPO | Source: Ambulatory Visit | Attending: Orthopedic Surgery | Admitting: Orthopedic Surgery

## 2014-11-09 DIAGNOSIS — S66211A Strain of extensor muscle, fascia and tendon of right thumb at wrist and hand level, initial encounter: Secondary | ICD-10-CM | POA: Diagnosis present

## 2014-11-09 DIAGNOSIS — S66911A Strain of unspecified muscle, fascia and tendon at wrist and hand level, right hand, initial encounter: Secondary | ICD-10-CM

## 2014-11-09 DIAGNOSIS — Z888 Allergy status to other drugs, medicaments and biological substances status: Secondary | ICD-10-CM | POA: Diagnosis not present

## 2014-11-09 DIAGNOSIS — I10 Essential (primary) hypertension: Secondary | ICD-10-CM | POA: Diagnosis not present

## 2014-11-09 DIAGNOSIS — Y929 Unspecified place or not applicable: Secondary | ICD-10-CM | POA: Insufficient documentation

## 2014-11-09 DIAGNOSIS — X58XXXA Exposure to other specified factors, initial encounter: Secondary | ICD-10-CM | POA: Insufficient documentation

## 2014-11-09 DIAGNOSIS — M069 Rheumatoid arthritis, unspecified: Secondary | ICD-10-CM | POA: Diagnosis not present

## 2014-11-09 DIAGNOSIS — F17210 Nicotine dependence, cigarettes, uncomplicated: Secondary | ICD-10-CM | POA: Insufficient documentation

## 2014-11-09 DIAGNOSIS — Z87442 Personal history of urinary calculi: Secondary | ICD-10-CM | POA: Diagnosis not present

## 2014-11-09 HISTORY — DX: Spontaneous rupture of extensor tendons, unspecified forearm: M66.239

## 2014-11-09 HISTORY — DX: Personal history of urinary calculi: Z87.442

## 2014-11-09 HISTORY — DX: Spontaneous rupture of extensor tendons, unspecified forearm: M66.249

## 2014-11-09 HISTORY — PX: TENDON TRANSFER: SHX6109

## 2014-11-09 LAB — POCT I-STAT 4, (NA,K, GLUC, HGB,HCT)
GLUCOSE: 84 mg/dL (ref 65–99)
HEMATOCRIT: 40 % (ref 36.0–46.0)
HEMOGLOBIN: 13.6 g/dL (ref 12.0–15.0)
POTASSIUM: 3.5 mmol/L (ref 3.5–5.1)
Sodium: 140 mmol/L (ref 135–145)

## 2014-11-09 SURGERY — TRANSFER, TENDON
Anesthesia: General | Site: Hand | Laterality: Right

## 2014-11-09 MED ORDER — DOCUSATE SODIUM 100 MG PO CAPS: 100.0000 mg | ORAL_CAPSULE | Freq: Two times a day (BID) | ORAL | Status: DC

## 2014-11-09 MED ORDER — MIDAZOLAM HCL 5 MG/5ML IJ SOLN
INTRAMUSCULAR | Status: DC | PRN
  Administered 2014-11-09: 2 mg via INTRAVENOUS

## 2014-11-09 MED ORDER — LACTATED RINGERS IV SOLN
INTRAVENOUS | Status: DC
  Filled 2014-11-09: qty 1000

## 2014-11-09 MED ORDER — CHLORHEXIDINE GLUCONATE 4 % EX LIQD
60.0000 mL | Freq: Once | CUTANEOUS | Status: DC
  Filled 2014-11-09: qty 60

## 2014-11-09 MED ORDER — OXYCODONE-ACETAMINOPHEN 5-325 MG PO TABS
1.0000 | ORAL_TABLET | Freq: Once | ORAL | Status: AC
  Administered 2014-11-09: 1 via ORAL
  Filled 2014-11-09: qty 1

## 2014-11-09 MED ORDER — PROMETHAZINE HCL 25 MG/ML IJ SOLN
6.2500 mg | INTRAMUSCULAR | Status: DC | PRN
  Filled 2014-11-09: qty 1

## 2014-11-09 MED ORDER — FENTANYL CITRATE (PF) 100 MCG/2ML IJ SOLN
25.0000 ug | INTRAMUSCULAR | Status: DC | PRN
  Filled 2014-11-09: qty 1

## 2014-11-09 MED ORDER — FENTANYL CITRATE (PF) 100 MCG/2ML IJ SOLN
INTRAMUSCULAR | Status: DC | PRN
  Administered 2014-11-09: 50 ug via INTRAVENOUS
  Administered 2014-11-09: 25 ug via INTRAVENOUS

## 2014-11-09 MED ORDER — DEXAMETHASONE SODIUM PHOSPHATE 4 MG/ML IJ SOLN
INTRAMUSCULAR | Status: DC | PRN
  Administered 2014-11-09: 10 mg via INTRAVENOUS

## 2014-11-09 MED ORDER — FENTANYL CITRATE (PF) 100 MCG/2ML IJ SOLN
INTRAMUSCULAR | Status: AC
  Filled 2014-11-09: qty 4

## 2014-11-09 MED ORDER — OXYCODONE-ACETAMINOPHEN 5-325 MG PO TABS
ORAL_TABLET | ORAL | Status: AC
  Filled 2014-11-09: qty 1

## 2014-11-09 MED ORDER — BUPIVACAINE HCL 0.25 % IJ SOLN
INTRAMUSCULAR | Status: DC | PRN
  Administered 2014-11-09: 8 mL

## 2014-11-09 MED ORDER — LACTATED RINGERS IV SOLN
INTRAVENOUS | Status: DC
  Administered 2014-11-09: 14:00:00 via INTRAVENOUS
  Filled 2014-11-09: qty 1000

## 2014-11-09 MED ORDER — LIDOCAINE HCL (CARDIAC) 20 MG/ML IV SOLN
INTRAVENOUS | Status: DC | PRN
  Administered 2014-11-09 (×2): 50 mg via INTRAVENOUS

## 2014-11-09 MED ORDER — OXYCODONE-ACETAMINOPHEN 5-325 MG PO TABS: 1.0000 | ORAL_TABLET | ORAL | Status: DC | PRN

## 2014-11-09 MED ORDER — MIDAZOLAM HCL 2 MG/2ML IJ SOLN
INTRAMUSCULAR | Status: AC
  Filled 2014-11-09: qty 2

## 2014-11-09 MED ORDER — 0.9 % SODIUM CHLORIDE (POUR BTL) OPTIME
TOPICAL | Status: DC | PRN
  Administered 2014-11-09: 50 mL

## 2014-11-09 MED ORDER — ONDANSETRON HCL 4 MG/2ML IJ SOLN
INTRAMUSCULAR | Status: DC | PRN
  Administered 2014-11-09: 4 mg via INTRAVENOUS

## 2014-11-09 MED ORDER — CEFAZOLIN SODIUM-DEXTROSE 2-3 GM-% IV SOLR
2.0000 g | INTRAVENOUS | Status: AC
  Administered 2014-11-09: 2 g via INTRAVENOUS
  Filled 2014-11-09: qty 50

## 2014-11-09 MED ORDER — MEPERIDINE HCL 25 MG/ML IJ SOLN
6.2500 mg | INTRAMUSCULAR | Status: DC | PRN
  Filled 2014-11-09: qty 1

## 2014-11-09 MED ORDER — PROPOFOL 10 MG/ML IV BOLUS
INTRAVENOUS | Status: DC | PRN
Start: 2014-11-09 — End: 2014-11-09
  Administered 2014-11-09: 150 mg via INTRAVENOUS
  Administered 2014-11-09: 50 mg via INTRAVENOUS

## 2014-11-09 SURGICAL SUPPLY — 56 items
BANDAGE ELASTIC 3 VELCRO ST LF (GAUZE/BANDAGES/DRESSINGS) ×2 IMPLANT
BANDAGE ELASTIC 4 VELCRO ST LF (GAUZE/BANDAGES/DRESSINGS) IMPLANT
BLADE SURG 15 STRL LF DISP TIS (BLADE) ×2 IMPLANT
BLADE SURG 15 STRL SS (BLADE) ×2
BNDG CONFORM 2 STRL LF (GAUZE/BANDAGES/DRESSINGS) ×2 IMPLANT
BNDG ELASTIC 2 VLCR STRL LF (GAUZE/BANDAGES/DRESSINGS) ×2 IMPLANT
BNDG ESMARK 4X9 LF (GAUZE/BANDAGES/DRESSINGS) ×2 IMPLANT
BNDG GAUZE ELAST 4 BULKY (GAUZE/BANDAGES/DRESSINGS) IMPLANT
CORDS BIPOLAR (ELECTRODE) ×2 IMPLANT
COVER BACK TABLE 60X90IN (DRAPES) ×2 IMPLANT
CUFF TOURNIQUET SINGLE 18IN (TOURNIQUET CUFF) ×2 IMPLANT
DRAPE EXTREMITY TIBURON (DRAPES) ×2 IMPLANT
DRAPE SURG 17X23 STRL (DRAPES) ×2 IMPLANT
DRSG ADAPTIC 3X8 NADH LF (GAUZE/BANDAGES/DRESSINGS) ×2 IMPLANT
GAUZE SPONGE 4X4 12PLY STRL (GAUZE/BANDAGES/DRESSINGS) ×2 IMPLANT
GLOVE BIOGEL PI IND STRL 8.5 (GLOVE) ×1 IMPLANT
GLOVE BIOGEL PI INDICATOR 8.5 (GLOVE) ×1
GLOVE INDICATOR 7.0 STRL GRN (GLOVE) ×2 IMPLANT
GLOVE INDICATOR 7.5 STRL GRN (GLOVE) ×2 IMPLANT
GLOVE SURG ORTHO 8.0 STRL STRW (GLOVE) ×2 IMPLANT
GLOVE SURG SS PI 7.5 STRL IVOR (GLOVE) ×2 IMPLANT
GOWN STRL REUS W/ TWL LRG LVL3 (GOWN DISPOSABLE) ×2 IMPLANT
GOWN STRL REUS W/TWL LRG LVL3 (GOWN DISPOSABLE) ×2
GOWN STRL REUS W/TWL XL LVL3 (GOWN DISPOSABLE) ×2 IMPLANT
LOOP VESSEL MAXI BLUE (MISCELLANEOUS) IMPLANT
LOOP VESSEL MINI RED (MISCELLANEOUS) IMPLANT
MANIFOLD NEPTUNE II (INSTRUMENTS) IMPLANT
NEEDLE HYPO 25X1 1.5 SAFETY (NEEDLE) IMPLANT
NS IRRIG 1000ML POUR BTL (IV SOLUTION) ×2 IMPLANT
PACK BASIN DAY SURGERY FS (CUSTOM PROCEDURE TRAY) ×2 IMPLANT
PAD CAST 4YDX4 CTTN HI CHSV (CAST SUPPLIES) IMPLANT
PADDING CAST ABS 3INX4YD NS (CAST SUPPLIES) ×1
PADDING CAST ABS 4INX4YD NS (CAST SUPPLIES)
PADDING CAST ABS COTTON 3X4 (CAST SUPPLIES) ×1 IMPLANT
PADDING CAST ABS COTTON 4X4 ST (CAST SUPPLIES) IMPLANT
PADDING CAST COTTON 4X4 STRL (CAST SUPPLIES)
SPLINT FIBERGLASS 3X35 (CAST SUPPLIES) ×2 IMPLANT
SPLINT FIBERGLASS 4X30 (CAST SUPPLIES) IMPLANT
SPLINT PLASTER CAST XFAST 3X15 (CAST SUPPLIES) IMPLANT
SPLINT PLASTER XTRA FASTSET 3X (CAST SUPPLIES)
STOCKINETTE 4X48 STRL (DRAPES) ×2 IMPLANT
SUCTION FRAZIER TIP 10 FR DISP (SUCTIONS) ×2 IMPLANT
SUT ETHIBOND 3-0 V-5 (SUTURE) IMPLANT
SUT ETHILON 4 0 PS 2 18 (SUTURE) IMPLANT
SUT FIBERWIRE 4-0 18 TAPR NDL (SUTURE) ×6
SUT MERSILENE 4 0 P 3 (SUTURE) IMPLANT
SUT PROLENE 4 0 PS 2 18 (SUTURE) ×2 IMPLANT
SUT VIC AB 2-0 SH 27 (SUTURE)
SUT VIC AB 2-0 SH 27XBRD (SUTURE) IMPLANT
SUT VICRYL 4-0 PS2 18IN ABS (SUTURE) IMPLANT
SUTURE FIBERWR 4-0 18 TAPR NDL (SUTURE) ×3 IMPLANT
SYR BULB 3OZ (MISCELLANEOUS) ×2 IMPLANT
SYR CONTROL 10ML LL (SYRINGE) ×2 IMPLANT
TRAY DSU PREP LF (CUSTOM PROCEDURE TRAY) ×2 IMPLANT
TUBE CONNECTING 12X1/4 (SUCTIONS) IMPLANT
UNDERPAD 30X30 INCONTINENT (UNDERPADS AND DIAPERS) ×2 IMPLANT

## 2014-11-09 NOTE — Anesthesia Procedure Notes (Signed)
Procedure Name: LMA Insertion Date/Time: 11/09/2014 2:54 PM Performed by: Tyrone Nine Pre-anesthesia Checklist: Patient identified, Timeout performed, Emergency Drugs available, Suction available and Patient being monitored Patient Re-evaluated:Patient Re-evaluated prior to inductionOxygen Delivery Method: Circle system utilized Preoxygenation: Pre-oxygenation with 100% oxygen Intubation Type: IV induction Ventilation: Mask ventilation without difficulty LMA: LMA inserted LMA Size: 4.0 Number of attempts: 1 Airway Equipment and Method: Bite block Placement Confirmation: breath sounds checked- equal and bilateral and positive ETCO2 Tube secured with: Tape Dental Injury: Teeth and Oropharynx as per pre-operative assessment

## 2014-11-09 NOTE — Brief Op Note (Signed)
11/09/2014  1:16 PM  PATIENT:  Isabella Burch  44 y.o. female  PRE-OPERATIVE DIAGNOSIS:  right thumb extensor tendon rupture  POST-OPERATIVE DIAGNOSIS:  * No post-op diagnosis entered *  PROCEDURE:  Procedure(s): RIGHT THUMB EIP TO EPL TENDON TRANSFER (Right)  SURGEON:  Surgeon(s) and Role:    * Bradly Bienenstock, MD - Primary  PHYSICIAN ASSISTANT:   ASSISTANTS: none   ANESTHESIA:   general  EBL:     BLOOD ADMINISTERED:none  DRAINS: none   LOCAL MEDICATIONS USED:  MARCAINE     SPECIMEN:  No Specimen  DISPOSITION OF SPECIMEN:  N/A  COUNTS:  YES  TOURNIQUET:    DICTATION: .Other Dictation: Dictation Number [id number]  PLAN OF CARE: Discharge to home after PACU  PATIENT DISPOSITION:  PACU - hemodynamically stable.   Delay start of Pharmacological VTE agent (>24hrs) due to surgical blood loss or risk of bleeding: not applicable

## 2014-11-09 NOTE — Discharge Instructions (Signed)
KEEP BANDAGE CLEAN AND DRY CALL OFFICE FOR F/U APPT [phone] IN 12 DAYS ALSO CALL FOR THERAPY APPT IN 12 DAYS [phone] EXT [address] [place] [phone] KEEP HAND ELEVATED ABOVE HEART OK TO APPLY ICE TO OPERATIVE AREA CONTACT OFFICE IF ANY WORSENING PAIN OR CONCERNS.        HAND SURGERY    HOME CARE INSTRUCTIONS    The following instructions have been prepared to help you care for yourself upon your return home today.  Wound Care:  Keep your hand elevated above the level of your heart. Do not allow it to dangle by your side. Keep the dressing dry and do not remove it unless your doctor advises you to do so. He will usually change it at the time of you post-op visit. Moving your fingers is advised to stimulate circulation but will depend on the site of your surgery. Of course, if you have a splint applied your doctor will advise you about movement.  Activity:  Do not drive or operate machinery today. Rest today and then you may return to your normal activity and work as indicated by your physician.  Diet: Drink liquids today or eat a light diet. You may resume a regular diet tomorrow.  General expectations: Pain for two or three days. Fingers may become slightly swollen.   Unexpected Observations- Call your doctor if any of these occur: Severe pain not relieved by pain medication. Elevated temperature. Dressing soaked with blood. Inability to move fingers. White or bluish color to fingers.      Post Anesthesia Home Care Instructions  Activity: Get plenty of rest for the remainder of the day. A responsible adult should stay with you for 24 hours following the procedure.  For the next 24 hours, DO NOT: -Drive a car -Advertising copywriter -Drink alcoholic beverages -Take any medication unless instructed by your physician -Make any legal decisions or sign important papers.  Meals: Start with liquid foods such as gelatin or soup. Progress to regular foods as tolerated.  Avoid greasy, spicy, heavy foods. If nausea and/or vomiting occur, drink only clear liquids until the nausea and/or vomiting subsides. Call your physician if vomiting continues.  Special Instructions/Symptoms: Your throat may feel dry or sore from the anesthesia or the breathing tube placed in your throat during surgery. If this causes discomfort, gargle with warm salt water. The discomfort should disappear within 24 hours.  If you had a scopolamine patch placed behind your ear for the management of post- operative nausea and/or vomiting:  1. The medication in the patch is effective for 72 hours, after which it should be removed.  Wrap patch in a tissue and discard in the trash. Wash hands thoroughly with soap and water. 2. You may remove the patch earlier than 72 hours if you experience unpleasant side effects which may include dry mouth, dizziness or visual disturbances. 3. Avoid touching the patch. Wash your hands with soap and water after contact with the patch.

## 2014-11-09 NOTE — H&P (Signed)
Isabella Burch is an 44 y.o. female.   Chief Complaint: Inabillity to use right thumb HPI: PT FOLLOWED IN OFFICE PT WITH RA PT UNABLE TO EXTEND THUMB PT HERE FOR SURGERY PRIOR SURGERY TO LEFT THUMB, DID VERY WELL  Past Medical History  Diagnosis Date  . Heart murmur   . Hypertension   . History of kidney stones   . Extensor tendon rupture, non-traumatic, hand and wrist     right  . Rheumatoid arthritis dx 2006    BILATERAL HANDS AND RIGHT ANKLE    Past Surgical History  Procedure Laterality Date  . Left wrist extensor tendon repair    . Tubal ligation    . Cystoscopy with ureteroscopy, stone basketry and stent placement  2011    Family History  Problem Relation Age of Onset  . Hyperlipidemia Father   . Hypertension Father   . Diabetes Father   . Hypothyroidism Mother    Social History:  reports that she has been smoking Cigarettes.  She has a 6.25 pack-year smoking history. She has never used smokeless tobacco. She reports that she does not drink alcohol or use illicit drugs.  Allergies:  Allergies  Allergen Reactions  . Enbrel [Etanercept] Swelling  . Humira [Adalimumab] Other (See Comments)    kidney dysfunction    Medications Prior to Admission  Medication Sig Dispense Refill  . hydrochlorothiazide (HYDRODIURIL) 12.5 MG tablet Take 1 tablet (12.5 mg total) by mouth daily. 90 tablet 0  . diazepam (VALIUM) 5 MG tablet Take 1 tablet (5 mg total) by mouth every 8 (eight) hours as needed for muscle spasms. 20 tablet 0  . leflunomide (ARAVA) 10 MG tablet TAKE 1 TABLET BY MOUTH ONCE A DAY    . naproxen (NAPROXEN DR) 500 MG EC tablet TAKE 1 TABLET (500 MG TOTAL) BY MOUTH 2 (TWO) TIMES DAILY WITH MEALS.    Marland Kitchen ORENCIA 125 MG/ML SOSY       No results found for this or any previous visit (from the past 48 hour(s)). No results found.  ROSNO RECENT ILLNESSES OR HOSPITALIZATIONS  Blood pressure 150/92, pulse 79, temperature 98.1 F (36.7 C), temperature source Oral, resp.  rate 20, weight 56.246 kg (124 lb), last menstrual period 11/09/2014, SpO2 99 %. Physical Exam  General Appearance:  Alert, cooperative, no distress, appears stated age  Head:  Normocephalic, without obvious abnormality, atraumatic  Eyes:  Pupils equal, conjunctiva/corneas clear,         Throat: Lips, mucosa, and tongue normal; teeth and gums normal  Neck: No visible masses     Lungs:   respirations unlabored  Chest Wall:  No tenderness or deformity  Heart:  Regular rate and rhythm,  Abdomen:   Soft, non-tender,         Extremities: RIGHT HAND: UNABLE TO EXTEND THUMB FROM PALM FLAT POSITION FINGERS WARM INDEPENDENT EIP FUNCTION GOOD WRIST AND DIGITAL MOBILITY  Pulses: 2+ and symmetric  Skin: Skin color, texture, turgor normal, no rashes or lesions     Neurologic: Normal    Assessment/Plan RHEUMATOID ARTHRITIS  RIGHT THUMB TENDON RUPTURE, EPL CHRONIC  RIGHT HAND EIP TO EPL TENDON TRANSFER  R/B/A DISCUSSED WITH PT IN OFFICE.  PT VOICED UNDERSTANDING OF PLAN CONSENT SIGNED DAY OF SURGERY PT SEEN AND EXAMINED PRIOR TO OPERATIVE PROCEDURE/DAY OF SURGERY SITE MARKED. QUESTIONS ANSWERED WILL GO HOME FOLLOWING SURGERY  WE ARE PLANNING SURGERY FOR YOUR UPPER EXTREMITY. THE RISKS AND BENEFITS OF SURGERY INCLUDE BUT NOT LIMITED TO BLEEDING INFECTION,  DAMAGE TO NEARBY NERVES ARTERIES TENDONS, FAILURE OF SURGERY TO ACCOMPLISH ITS INTENDED GOALS, PERSISTENT SYMPTOMS AND NEED FOR FURTHER SURGICAL INTERVENTION. WITH THIS IN MIND WE WILL PROCEED. I HAVE DISCUSSED WITH THE PATIENT THE PRE AND POSTOPERATIVE REGIMEN AND THE DOS AND DON'TS. PT VOICED UNDERSTANDING AND INFORMED CONSENT SIGNED.  Sharma Covert 11/09/2014, 1:16 PM

## 2014-11-09 NOTE — Transfer of Care (Signed)
Immediate Anesthesia Transfer of Care Note  Patient: Isabella Burch  Procedure(s) Performed: Procedure(s): RIGHT THUMB EIP TO EPL TENDON TRANSFER (Right)  Patient Location: PACU  Anesthesia Type:General  Level of Consciousness: awake, alert , oriented and patient cooperative  Airway & Oxygen Therapy: Patient Spontanous Breathing and Patient connected to nasal cannula oxygen  Post-op Assessment: Report given to RN and Post -op Vital signs reviewed and stable  Post vital signs: Reviewed and stable  Last Vitals:  Filed Vitals:   11/09/14 1306  BP: 150/92  Pulse: 79  Temp: 36.7 C  Resp: 20    Complications: No apparent anesthesia complications

## 2014-11-09 NOTE — Anesthesia Postprocedure Evaluation (Signed)
  Anesthesia Post-op Note  Patient: Isabella Burch  Procedure(s) Performed: Procedure(s): RIGHT THUMB EIP TO EPL TENDON TRANSFER (Right)  Patient Location: PACU  Anesthesia Type:General  Level of Consciousness: awake  Airway and Oxygen Therapy: Patient Spontanous Breathing  Post-op Pain: mild  Post-op Assessment: Post-op Vital signs reviewed, Patient's Cardiovascular Status Stable, Respiratory Function Stable, Patent Airway, No signs of Nausea or vomiting and Pain level controlled              Post-op Vital Signs: Reviewed and stable  Last Vitals:  Filed Vitals:   11/09/14 1723  BP: 143/95  Pulse: 63  Temp: 36.7 C  Resp: 16    Complications: No apparent anesthesia complications

## 2014-11-09 NOTE — Anesthesia Preprocedure Evaluation (Signed)
Anesthesia Evaluation  Patient identified by MRN, date of birth, ID band Patient awake    Reviewed: Allergy & Precautions, NPO status , Patient's Chart, lab work & pertinent test results  Airway Mallampati: II  TM Distance: >3 FB Neck ROM: Full    Dental no notable dental hx. (+) Missing   Pulmonary Current Smoker,  breath sounds clear to auscultation  Pulmonary exam normal       Cardiovascular hypertension, Pt. on medications Normal cardiovascular examRhythm:Regular Rate:Normal     Neuro/Psych negative neurological ROS  negative psych ROS   GI/Hepatic negative GI ROS, Neg liver ROS,   Endo/Other  negative endocrine ROS  Renal/GU negative Renal ROS  negative genitourinary   Musculoskeletal  (+) Arthritis -, Rheumatoid disorders,    Abdominal   Peds negative pediatric ROS (+)  Hematology negative hematology ROS (+)   Anesthesia Other Findings   Reproductive/Obstetrics negative OB ROS                             Anesthesia Physical Anesthesia Plan  ASA: II  Anesthesia Plan: General   Post-op Pain Management:    Induction: Intravenous  Airway Management Planned: LMA  Additional Equipment:   Intra-op Plan:   Post-operative Plan: Extubation in OR  Informed Consent: I have reviewed the patients History and Physical, chart, labs and discussed the procedure including the risks, benefits and alternatives for the proposed anesthesia with the patient or authorized representative who has indicated his/her understanding and acceptance.   Dental advisory given  Plan Discussed with: CRNA  Anesthesia Plan Comments:         Anesthesia Quick Evaluation

## 2014-11-10 ENCOUNTER — Encounter (HOSPITAL_BASED_OUTPATIENT_CLINIC_OR_DEPARTMENT_OTHER): Payer: Self-pay | Admitting: Orthopedic Surgery

## 2014-11-10 NOTE — Op Note (Signed)
NAMEPEOLA, JOYNT NO.:  [id number]  MEDICAL RECORD NO.:  [id number]  LOCATION:                               FACILITY:  [facility]  PHYSICIAN:  Sharma Covert IV, M.D.DATE OF BIRTH:  Aug 24, 1970  DATE OF PROCEDURE:  11/09/2014 DATE OF DISCHARGE:  11/09/2014                              OPERATIVE REPORT   PREOPERATIVE DIAGNOSES: 1. Right thumb extensor pollicis longus tendon rupture. 2. Rheumatoid arthritis.  POSTOPERATIVE DIAGNOSES: 1. Right thumb extensor pollicis longus tendon rupture. 2. Rheumatoid arthritis.  ATTENDING PHYSICIAN:  Madelynn Done, MD, who scrubbed and present for the entire procedure.  ASSISTANT SURGEON:  None.  ANESTHESIA:  General via LMA.  SURGICAL PROCEDURE:  Right thumb extensor indices proprius transfer to extensor pollicis longus in dorsum of the hand, tendon transfer to carpal and metacarpal joint region of the hand.  SURGICAL INDICATIONS:  Ms. Radu is a right-hand dominant female who sustained an atraumatic rupture of the thumb EPL.  She was unable to extend the thumb.  The patient had previously undergone this on the left side and after talking with her in detail, she elected to undergo on the right side, the left side was 4 years ago.  Risks, benefits, and alternatives were discussed in detail with the patient and signed informed consent was obtained.  Risks include, but not limited to bleeding, infection, damage to nearby nerves, arteries, or tendons, loss of motion of the wrist and digits, incomplete relief of symptoms, and need for further surgical intervention.  DESCRIPTION OF PROCEDURE:  The patient was properly identified in the preop holding area, and mark with a permanent marker made on the right thumb to indicate the correct operative site.  The patient was then brought back to the operating room, placed supine on the anesthesia room table where general anesthesia was administered.  The patient  tolerated this well.  A well-padded tourniquet placed on the right brachium and sealed with 1000 drape.  Right upper extremity was then prepped and draped in normal sterile fashion.  Time-out was called.  Correct site was identified, and procedure then begun.  Attention then turned to the right hand.  A transverse incision was made directly over the index finger MP region.  PIP was then identified ulnarly.  This was then transected and the distal stump was then sewn back to the EDC to the index finger.  This incision was then closed with 4-0 Prolene suture.  A counter incision was made directly over the fourth dorsal compartment. The tendon was then identified and retrieved through the retinaculum of the fourth dorsal compartment.  A small pulley was then created over the retinaculum bringing the tendon all the way to the dorsal aspect of the thumb.  An incision was made directly over the thumb metacarpal MP joint.  The EPL was then carefully identified. The tendon then delivered subcutaneously to the thumb.  It was then brought down to the thumb EPL. The thumb was kept in extension and slight abduction and sewn nicely. The tenodesis effect was then carried out with sewing of the __________ with the FiberWire suture.  Then, following this, three Pulvertaft weaves  were then made through the EPL tendon reinforcing with the FiberWire suture.  The wound was then thoroughly irrigated.  Good tenodesis effect was then carried out after final construct.  Following this, the skin was then closed using horizontal mattress Prolene sutures.  An 8 mL of 0.25% Marcaine infiltrated between the incisions. Adaptic dressing, sterile compressive bandage then applied.  The patient was then placed in a well-padded thumb spica splint, extubated and taken to recovery room in good condition.  POSTOPERATIVE PLAN:  The patient discharged home, seen back in the office in approximately 2 weeks for wound check,  suture removal, and begin a postoperative EIP to EPL tendon transfer with her therapist.     Madelynn Done, M.D.     FWO/MEDQ  D:  11/09/2014  T:  11/09/2014  Job:  [id number]

## 2014-12-04 ENCOUNTER — Encounter: Payer: Self-pay | Admitting: Nurse Practitioner

## 2014-12-04 ENCOUNTER — Ambulatory Visit (INDEPENDENT_AMBULATORY_CARE_PROVIDER_SITE_OTHER): Payer: BC Managed Care – PPO | Admitting: Nurse Practitioner

## 2014-12-04 VITALS — BP 120/92 | HR 76 | Temp 98.1°F | Resp 14 | Ht 67.0 in | Wt 125.4 lb

## 2014-12-04 DIAGNOSIS — I10 Essential (primary) hypertension: Secondary | ICD-10-CM

## 2014-12-04 DIAGNOSIS — F410 Panic disorder [episodic paroxysmal anxiety] without agoraphobia: Secondary | ICD-10-CM | POA: Insufficient documentation

## 2014-12-04 NOTE — Progress Notes (Signed)
Patient ID: Isabella Burch, female    DOB: 1971-03-20  Age: 44 y.o. MRN: [id number]  CC: Follow-up   HPI Isabella Burch presents for HTN follow up and CC of anxiety.   1) Has not checked since surgery  Taking daily, no concerns   2) Panic attack-  Heart racing, felt scared, felt doom feeling- lasted 10 min. Felt tired afterwards. Lots of stress from family. Trying to work through it. This has been ongoing for the last year. Feels her load keeps getting heavier she reports. Money stressors also a factor. Her sister is a Engineer, civil (consulting) and is giving pt a lot of advice, but pt does not feel it is helpful and more stress.   BP Readings from Last 3 Encounters:  12/04/14 120/92  11/09/14 143/95  10/05/14 118/78    History Isabella Burch has a past medical history of Heart murmur; Hypertension; History of kidney stones; Extensor tendon rupture, non-traumatic, hand and wrist; and Rheumatoid arthritis (dx 2006).   She has past surgical history that includes LEFT WRIST EXTENSOR TENDON REPAIR; Tubal ligation; Cystoscopy with ureteroscopy, stone basketry and stent placement (2011); and Tendon transfer (Right, 11/09/2014).   Her family history includes Diabetes in her father; Hyperlipidemia in her father; Hypertension in her father; Hypothyroidism in her mother.She reports that she has been smoking Cigarettes.  She has a 6.25 pack-year smoking history. She has never used smokeless tobacco. She reports that she does not drink alcohol or use illicit drugs.  Outpatient Prescriptions Prior to Visit  Medication Sig Dispense Refill  . hydrochlorothiazide (HYDRODIURIL) 12.5 MG tablet Take 1 tablet (12.5 mg total) by mouth daily. 90 tablet 0  . leflunomide (ARAVA) 10 MG tablet TAKE 1 TABLET BY MOUTH ONCE A DAY    . ORENCIA 125 MG/ML SOSY     . oxyCODONE-acetaminophen (ROXICET) 5-325 MG per tablet Take 1 tablet by mouth every 4 (four) hours as needed for severe pain. 45 tablet 0  . naproxen (NAPROXEN DR) 500 MG EC tablet TAKE 1  TABLET (500 MG TOTAL) BY MOUTH 2 (TWO) TIMES DAILY WITH MEALS.    . diazepam (VALIUM) 5 MG tablet Take 1 tablet (5 mg total) by mouth every 8 (eight) hours as needed for muscle spasms. (Patient not taking: Reported on 12/04/2014) 20 tablet 0  . docusate sodium (COLACE) 100 MG capsule Take 1 capsule (100 mg total) by mouth 2 (two) times daily. (Patient not taking: Reported on 12/04/2014) 30 capsule 0   No facility-administered medications prior to visit.    ROS Review of Systems  Constitutional: Negative for fever, chills, diaphoresis and fatigue.  Eyes: Negative for visual disturbance.  Respiratory: Negative for cough, chest tightness, shortness of breath and wheezing.   Cardiovascular: Negative for chest pain, palpitations and leg swelling.  Gastrointestinal: Negative for nausea, vomiting, abdominal pain, diarrhea, constipation and blood in stool.  Genitourinary: Negative for dysuria, hematuria, vaginal discharge and vaginal pain.  Musculoskeletal: Positive for myalgias and arthralgias. Negative for back pain and gait problem.       Right thumb surgery recent, mild discomfort during day -moderate at night  Skin: Negative for rash.  Neurological: Positive for dizziness. Negative for weakness, numbness and headaches.       1 dizzy episode recently, lasted a few hours  Hematological: Does not bruise/bleed easily.  Psychiatric/Behavioral: Negative for suicidal ideas and sleep disturbance. The patient is nervous/anxious.     Objective:  BP 120/92 mmHg  Pulse 76  Temp(Src) 98.1 F (36.7 C)  Resp 14  Ht 5\' 7"  (1.702 m)  Wt 125 lb 6.4 oz (56.881 kg)  BMI 19.64 kg/m2  SpO2 97%  LMP 11/09/2014  Physical Exam  Constitutional: She is oriented to person, place, and time. She appears well-developed and well-nourished. No distress.  HENT:  Head: Normocephalic and atraumatic.  Right Ear: External ear normal.  Left Ear: External ear normal.  Cardiovascular: Normal rate, regular rhythm and  normal heart sounds.  Exam reveals no gallop and no friction rub.   No murmur heard. Pulmonary/Chest: Effort normal and breath sounds normal. No respiratory distress. She has no wheezes. She has no rales. She exhibits no tenderness.  Neurological: She is alert and oriented to person, place, and time. No cranial nerve deficit. She exhibits normal muscle tone. Coordination normal.  Skin: Skin is warm and dry. No rash noted. She is not diaphoretic.  Psychiatric: She has a normal mood and affect. Her behavior is normal. Judgment and thought content normal.  Tearful today   Assessment & Plan:   Isabella Burch was seen today for follow-up.  Diagnoses and all orders for this visit:  Panic attacks  Benign essential HTN   I have discontinued [name]'s diazepam and docusate sodium. I am also having her maintain her ORENCIA, leflunomide, naproxen, hydrochlorothiazide, and oxyCODONE-acetaminophen.  No orders of the defined types were placed in this encounter.    Follow-up: Return in about 2 weeks (around 12/18/2014) for Anxiety.

## 2014-12-04 NOTE — Assessment & Plan Note (Signed)
Pt has had high stress load recently and new onset of panic attacks. Discussed different methods for treatment such as spiritual, counseling, meditation/yoga, and medications. Pt preferred meditation/yoga method and a handout on stress management was given. We will follow up in 2 weeks. Pt denied suicidal/homicidal ideations.

## 2014-12-04 NOTE — Patient Instructions (Signed)
Dramamine- Over the counter for vertigo  Stress and Stress Management Stress is a normal reaction to life events. It is what you feel when life demands more than you are used to or more than you can handle. Some stress can be useful. For example, the stress reaction can help you catch the last bus of the day, study for a test, or meet a deadline at work. But stress that occurs too often or for too long can cause problems. It can affect your emotional health and interfere with relationships and normal daily activities. Too much stress can weaken your immune system and increase your risk for physical illness. If you already have a medical problem, stress can make it worse. CAUSES  All sorts of life events may cause stress. An event that causes stress for one person may not be stressful for another person. Major life events commonly cause stress. These may be positive or negative. Examples include losing your job, moving into a new home, getting married, having a baby, or losing a loved one. Less obvious life events may also cause stress, especially if they occur day after day or in combination. Examples include working long hours, driving in traffic, caring for children, being in debt, or being in a difficult relationship. SIGNS AND SYMPTOMS Stress may cause emotional symptoms including, the following:  Anxiety. This is feeling worried, afraid, on edge, overwhelmed, or out of control.  Anger. This is feeling irritated or impatient.  Depression. This is feeling sad, down, helpless, or guilty.  Difficulty focusing, remembering, or making decisions. Stress may cause physical symptoms, including the following:   Aches and pains. These may affect your head, neck, back, stomach, or other areas of your body.  Tight muscles or clenched jaw.  Low energy or trouble sleeping. Stress may cause unhealthy behaviors, including the following:   Eating to feel better (overeating) or skipping  meals.  Sleeping too little, too much, or both.  Working too much or putting off tasks (procrastination).  Smoking, drinking alcohol, or using drugs to feel better. DIAGNOSIS  Stress is diagnosed through an assessment by your health care provider. Your health care provider will ask questions about your symptoms and any stressful life events.Your health care provider will also ask about your medical history and may order blood tests or other tests. Certain medical conditions and medicine can cause physical symptoms similar to stress. Mental illness can cause emotional symptoms and unhealthy behaviors similar to stress. Your health care provider may refer you to a mental health professional for further evaluation.  TREATMENT  Stress management is the recommended treatment for stress.The goals of stress management are reducing stressful life events and coping with stress in healthy ways.  Techniques for reducing stressful life events include the following:  Stress identification. Self-monitor for stress and identify what causes stress for you. These skills may help you to avoid some stressful events.  Time management. Set your priorities, keep a calendar of events, and learn to say "no." These tools can help you avoid making too many commitments. Techniques for coping with stress include the following:  Rethinking the problem. Try to think realistically about stressful events rather than ignoring them or overreacting. Try to find the positives in a stressful situation rather than focusing on the negatives.  Exercise. Physical exercise can release both physical and emotional tension. The key is to find a form of exercise you enjoy and do it regularly.  Relaxation techniques. These relax the body and mind. Examples  include yoga, meditation, tai chi, biofeedback, deep breathing, progressive muscle relaxation, listening to music, being out in nature, journaling, and other hobbies. Again, the key is  to find one or more that you enjoy and can do regularly.  Healthy lifestyle. Eat a balanced diet, get plenty of sleep, and do not smoke. Avoid using alcohol or drugs to relax.  Strong support network. Spend time with family, friends, or other people you enjoy being around.Express your feelings and talk things over with someone you trust. Counseling or talktherapy with a mental health professional may be helpful if you are having difficulty managing stress on your own. Medicine is typically not recommended for the treatment of stress.Talk to your health care provider if you think you need medicine for symptoms of stress. HOME CARE INSTRUCTIONS  Keep all follow-up visits as directed by your health care provider.  Take all medicines as directed by your health care provider. SEEK MEDICAL CARE IF:  Your symptoms get worse or you start having new symptoms.  You feel overwhelmed by your problems and can no longer manage them on your own. SEEK IMMEDIATE MEDICAL CARE IF:  You feel like hurting yourself or someone else. Document Released: 10/01/2000 Document Revised: 08/22/2013 Document Reviewed: 11/30/2012 [facility] Patient Information 2015 [place], [place]. This information is not intended to replace advice given to you by your health care provider. Make sure you discuss any questions you have with your health care provider.

## 2014-12-04 NOTE — Progress Notes (Signed)
Pre visit review using our clinic review tool, if applicable. No additional management support is needed unless otherwise documented below in the visit note. 

## 2014-12-04 NOTE — Assessment & Plan Note (Signed)
BP Readings from Last 3 Encounters:  12/04/14 120/92  11/09/14 143/95  10/05/14 118/78   Improved, but diastolic still not at goal. Did not repeat due to tearfulness and pt was discussing stressors. FU in 2 weeks

## 2014-12-18 ENCOUNTER — Ambulatory Visit: Payer: BC Managed Care – PPO | Admitting: Nurse Practitioner

## 2014-12-18 DIAGNOSIS — Z0289 Encounter for other administrative examinations: Secondary | ICD-10-CM

## 2014-12-28 ENCOUNTER — Other Ambulatory Visit: Payer: Self-pay | Admitting: Nurse Practitioner

## 2015-02-21 ENCOUNTER — Ambulatory Visit: Payer: BC Managed Care – PPO | Admitting: Nurse Practitioner

## 2015-02-21 DIAGNOSIS — Z0289 Encounter for other administrative examinations: Secondary | ICD-10-CM

## 2015-03-28 ENCOUNTER — Other Ambulatory Visit: Payer: Self-pay | Admitting: Nurse Practitioner

## 2015-05-17 ENCOUNTER — Emergency Department: Payer: BC Managed Care – PPO

## 2015-05-17 ENCOUNTER — Encounter: Payer: Self-pay | Admitting: Emergency Medicine

## 2015-05-17 ENCOUNTER — Emergency Department
Admission: EM | Admit: 2015-05-17 | Discharge: 2015-05-17 | Disposition: A | Discharge status: Home / Self Care | Payer: BC Managed Care – PPO | Attending: Emergency Medicine | Admitting: Emergency Medicine

## 2015-05-17 DIAGNOSIS — Z79899 Other long term (current) drug therapy: Secondary | ICD-10-CM | POA: Diagnosis not present

## 2015-05-17 DIAGNOSIS — Y93F2 Activity, caregiving, lifting: Secondary | ICD-10-CM | POA: Diagnosis not present

## 2015-05-17 DIAGNOSIS — Y990 Civilian activity done for income or pay: Secondary | ICD-10-CM | POA: Insufficient documentation

## 2015-05-17 DIAGNOSIS — I10 Essential (primary) hypertension: Secondary | ICD-10-CM | POA: Diagnosis not present

## 2015-05-17 DIAGNOSIS — Z791 Long term (current) use of non-steroidal anti-inflammatories (NSAID): Secondary | ICD-10-CM | POA: Diagnosis not present

## 2015-05-17 DIAGNOSIS — F17210 Nicotine dependence, cigarettes, uncomplicated: Secondary | ICD-10-CM | POA: Insufficient documentation

## 2015-05-17 DIAGNOSIS — X501XXA Overexertion from prolonged static or awkward postures, initial encounter: Secondary | ICD-10-CM | POA: Diagnosis not present

## 2015-05-17 DIAGNOSIS — Y92219 Unspecified school as the place of occurrence of the external cause: Secondary | ICD-10-CM | POA: Diagnosis not present

## 2015-05-17 DIAGNOSIS — Z3202 Encounter for pregnancy test, result negative: Secondary | ICD-10-CM | POA: Insufficient documentation

## 2015-05-17 DIAGNOSIS — S299XXA Unspecified injury of thorax, initial encounter: Secondary | ICD-10-CM | POA: Diagnosis not present

## 2015-05-17 DIAGNOSIS — R079 Chest pain, unspecified: Secondary | ICD-10-CM

## 2015-05-17 LAB — URINALYSIS COMPLETE WITH MICROSCOPIC (ARMC ONLY)
BILIRUBIN URINE: NEGATIVE
Bacteria, UA: NONE SEEN
Glucose, UA: NEGATIVE mg/dL
Hgb urine dipstick: NEGATIVE
KETONES UR: NEGATIVE mg/dL
Leukocytes, UA: NEGATIVE
Nitrite: NEGATIVE
PH: 7 (ref 5.0–8.0)
PROTEIN: NEGATIVE mg/dL
RBC / HPF: NONE SEEN RBC/hpf (ref 0–5)
Specific Gravity, Urine: 1.008 (ref 1.005–1.030)

## 2015-05-17 LAB — TROPONIN I

## 2015-05-17 LAB — BASIC METABOLIC PANEL
Anion gap: 5 (ref 5–15)
BUN: 14 mg/dL (ref 6–20)
CALCIUM: 8.5 mg/dL — AB (ref 8.9–10.3)
CHLORIDE: 102 mmol/L (ref 101–111)
CO2: 26 mmol/L (ref 22–32)
CREATININE: 0.79 mg/dL (ref 0.44–1.00)
GFR calc non Af Amer: >60 mL/min (ref >60)
GLUCOSE: 107 mg/dL — AB (ref 65–99)
Potassium: 4 mmol/L (ref 3.5–5.1)
Sodium: 133 mmol/L — ABNORMAL LOW (ref 135–145)

## 2015-05-17 LAB — CBC
HCT: 38.9 % (ref 35.0–47.0)
Hemoglobin: 13.3 g/dL (ref 12.0–16.0)
MCH: 32.8 pg (ref 26.0–34.0)
MCHC: 34.2 g/dL (ref 32.0–36.0)
MCV: 96 fL (ref 80.0–100.0)
PLATELETS: 266 10*3/uL (ref 150–440)
RBC: 4.06 MIL/uL (ref 3.80–5.20)
RDW: 12.7 % (ref 11.5–14.5)
WBC: 4.7 10*3/uL (ref 3.6–11.0)

## 2015-05-17 LAB — POCT PREGNANCY, URINE: Preg Test, Ur: NEGATIVE

## 2015-05-17 MED ORDER — CARISOPRODOL 350 MG PO TABS: 350.0000 mg | ORAL_TABLET | Freq: Three times a day (TID) | ORAL | Status: DC | PRN

## 2015-05-17 MED ORDER — KETOROLAC TROMETHAMINE 60 MG/2ML IM SOLN
30.0000 mg | Freq: Once | INTRAMUSCULAR | Status: AC
  Administered 2015-05-17: 30 mg via INTRAMUSCULAR
  Filled 2015-05-17: qty 2

## 2015-05-17 NOTE — ED Provider Notes (Signed)
[facility] Emergency Department Provider Note  ____________________________________________  Time seen: Approximately 745 PM  I have reviewed the triage vital signs and the nursing notes.   HISTORY  Chief Complaint Chest Pain    HPI Isabella Burch is a 45 y.o. female with a history of hypertension and rheumatoid arthritis who is presenting today with right-sided chest pain since this past Monday. She says the pain worsened yesterday. 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 She says that the pain worsens with movement as well as deep breathing. No recent cough or illness. Denies any nausea vomiting or diaphoresis. Says the chest pain is in the middle of the chest and radiates to the right. Says it has been constant over the past day. She does have a history of rheumatoid arthritis and is on immunomodulators.   Past Medical History  Diagnosis Date  . Heart murmur   . Hypertension   . History of kidney stones   . Extensor tendon rupture, non-traumatic, hand and wrist     right  . Rheumatoid arthritis (HCC) dx 2006    BILATERAL HANDS AND RIGHT ANKLE    Patient Active Problem List   Diagnosis Date Noted  . Panic attacks 12/04/2014  . Benign essential HTN 10/08/2014  . Routine general medical examination at a health care facility 02/25/2013    Past Surgical History  Procedure Laterality Date  . Left wrist extensor tendon repair    . Tubal ligation    . Cystoscopy with ureteroscopy, stone basketry and stent placement  2011  . Tendon transfer Right 11/09/2014    Procedure: RIGHT THUMB EIP TO EPL TENDON TRANSFER;  Surgeon: Bradly Bienenstock, MD;  Location: [facility];  Service: Orthopedics;  Laterality: Right;    Current Outpatient Rx  Name  Route  Sig  Dispense  Refill  . carisoprodol (SOMA) 350 MG tablet   Oral   Take 1 tablet (350 mg total) by mouth 3 (three) times daily as needed  for muscle spasms.   15 tablet   0   . hydrochlorothiazide (HYDRODIURIL) 25 MG tablet      TAKE 1/2 TABLET BY MOUTH DAILY   45 tablet   1   . leflunomide (ARAVA) 10 MG tablet      TAKE 1 TABLET BY MOUTH ONCE A DAY         . naproxen (NAPROXEN DR) 500 MG EC tablet      TAKE 1 TABLET (500 MG TOTAL) BY MOUTH 2 (TWO) TIMES DAILY WITH MEALS.         Marland Kitchen ORENCIA 125 MG/ML SOSY                 Dispense as written.   Marland Kitchen oxyCODONE-acetaminophen (ROXICET) 5-325 MG per tablet   Oral   Take 1 tablet by mouth every 4 (four) hours as needed for severe pain.   45 tablet   0     Allergies Enbrel and Humira  Family History  Problem Relation Age of Onset  . Hyperlipidemia Father   . Hypertension Father   . Diabetes Father   . Hypothyroidism Mother     Social History Social History  Substance Use Topics  . Smoking status: Current Every Day Smoker -- 0.50 packs/day for 25 years    Types: Cigarettes  . Smokeless tobacco: Never Used  . Alcohol Use: No    Review of Systems  Constitutional: No fever/chills Eyes: No visual changes. ENT: No sore throat. Cardiovascular: As above Respiratory: Denies shortness of breath. Gastrointestinal: No abdominal pain.  No nausea, no vomiting.  No diarrhea.  No constipation. Genitourinary: Negative for dysuria. Musculoskeletal: Negative for back pain. Skin: Negative for rash. Neurological: Negative for headaches, focal weakness or numbness.  10-point ROS otherwise negative.  ____________________________________________   PHYSICAL EXAM:  VITAL SIGNS: ED Triage Vitals  Enc Vitals Group     BP 05/17/15 1757 128/91 mmHg     Pulse Rate 05/17/15 1757 68     Resp 05/17/15 1757 20     Temp 05/17/15 1757 98.3 F (36.8 C)     Temp Source 05/17/15 1757 Oral     SpO2 05/17/15 1757 97 %     Weight 05/17/15 1757 125 lb (56.7 kg)     Height 05/17/15 1757 5\' 6"  (1.676 m)     Head Cir --      Peak Flow --      Pain Score 05/17/15 1805 8      Pain Loc --      Pain Edu? --      Excl. in GC? --     Constitutional: Alert and oriented. Well appearing and in no acute distress. Eyes: Conjunctivae are normal. PERRL. EOMI. Head: Atraumatic. Nose: No congestion/rhinnorhea. Mouth/Throat: Mucous membranes are moist.   Neck: No stridor.   Cardiovascular: Normal rate, regular rhythm. Grossly normal heart sounds.  Good peripheral circulation. Chest pain is reproducible to palpation at the right sternal border. Respiratory: Normal respiratory effort.  No retractions. Lungs CTAB. Gastrointestinal: Soft and nontender. No distention. No abdominal bruits. No CVA tenderness. Musculoskeletal: No lower extremity tenderness nor edema.  No joint effusions. Neurologic:  Normal speech and language. No gross focal neurologic deficits are appreciated. No gait instability. Skin:  Skin is warm, dry and intact. No rash noted. Psychiatric: Mood and affect are normal. Speech and behavior are normal.  ____________________________________________   LABS (all labs ordered are listed, but only abnormal results are displayed)  Labs Reviewed  BASIC METABOLIC PANEL - Abnormal; Notable for the following:    Sodium 133 (*)    Glucose, Bld 107 (*)    Calcium 8.5 (*)    All other components within normal limits  URINALYSIS COMPLETEWITH MICROSCOPIC ([facility] ONLY) - Abnormal; Notable for the following:    Color, Urine STRAW (*)    APPearance CLEAR (*)    Squamous Epithelial / LPF 0-5 (*)    All other components within normal limits  CBC  TROPONIN I  POC URINE PREG, ED  POCT PREGNANCY, URINE   ____________________________________________  EKG  ED ECG REPORT I, Schaevitz,  05/19/15, the attending physician, personally viewed and interpreted this ECG.   Date: 05/17/2015  EKG Time: 1752  Rate: 70  Rhythm: normal sinus rhythm  Axis: Normal axis  Intervals:none  ST&T Change: No ST segment elevation or depression. No abnormal T-wave  inversion.  ____________________________________________  RADIOLOGY  No active cardiopulmonary disease. ____________________________________________   PROCEDURES   ____________________________________________   INITIAL IMPRESSION / ASSESSMENT AND PLAN / ED COURSE  Pertinent labs & imaging results that were available during my care of the patient were reviewed by me and considered in my medical decision making (see chart for details).  ----------------------------------------- 8:30 PM on 05/17/2015 ----------------------------------------- Patient's pain is improved after Toradol IM. She is now able to move her right arm as well as take deep breaths with significantly increased comfort and decreased  pain. Likely the patient has costochondritis. She will continue to take Aleve as needed. I also discussed using Aspercreme or icy hot for localized relief. I also discharge her with a muscle relaxer. Very reassuring EKG as well as lab/cardiac workup. PERC negative and Well's of 0.  Already has an appointment scheduled for this Tuesday with her primary care doctor who is managing her rheumatoid arthritis. She is already on immunomodulators and I did not want at a steroid to this especially since she found relief with the Toradol.  ____________________________________________   FINAL CLINICAL IMPRESSION(S) / ED DIAGNOSES  Final diagnoses:  Chest pain, unspecified chest pain type   Chest wall pain.   Myrna Blazer, MD 05/17/15 2053

## 2015-05-17 NOTE — ED Notes (Signed)
Pt states midsternal cp that began that began yesterday, states she has RA and can't be seen by Dr until next Tu, she feels this could be an RA flare-up. She does do lifting with her job, site tender to touch.

## 2015-10-05 ENCOUNTER — Other Ambulatory Visit: Payer: Self-pay | Admitting: Nurse Practitioner

## 2015-10-05 NOTE — Telephone Encounter (Signed)
Last Visit 12/04/14 Doss please advise to refill  BMet done in hospital encounter 1/17

## 2015-11-21 ENCOUNTER — Encounter: Payer: Self-pay | Admitting: Family Medicine

## 2015-11-21 ENCOUNTER — Ambulatory Visit (INDEPENDENT_AMBULATORY_CARE_PROVIDER_SITE_OTHER): Payer: BC Managed Care – PPO | Admitting: Family Medicine

## 2015-11-21 ENCOUNTER — Telehealth: Payer: Self-pay | Admitting: Nurse Practitioner

## 2015-11-21 VITALS — BP 153/98 | HR 85 | Temp 98.5°F | Wt 118.2 lb

## 2015-11-21 DIAGNOSIS — Z13220 Encounter for screening for lipoid disorders: Secondary | ICD-10-CM | POA: Diagnosis not present

## 2015-11-21 DIAGNOSIS — I498 Other specified cardiac arrhythmias: Secondary | ICD-10-CM

## 2015-11-21 DIAGNOSIS — R7309 Other abnormal glucose: Secondary | ICD-10-CM | POA: Diagnosis not present

## 2015-11-21 DIAGNOSIS — I499 Cardiac arrhythmia, unspecified: Secondary | ICD-10-CM | POA: Diagnosis not present

## 2015-11-21 DIAGNOSIS — R079 Chest pain, unspecified: Secondary | ICD-10-CM | POA: Diagnosis not present

## 2015-11-21 MED ORDER — CLONAZEPAM 0.5 MG PO TABS: 0.5000 mg | ORAL_TABLET | Freq: Two times a day (BID) | ORAL | 1 refills | Status: DC | PRN

## 2015-11-21 NOTE — Telephone Encounter (Signed)
Patient Name: Isabella Burch  DOB: 01/06/1971    Initial Comment yesterday had pain in chest that spread out across it, only happened 2x yesterday, nothing today.    Nurse Assessment  Nurse: Sherilyn Cooter, RN, Thurmond Butts Date/Time Lamount Cohen Time): 11/21/2015 9:45:50 AM  Confirm and document reason for call. If symptomatic, describe symptoms. You must click the next button to save text entered. ---Caller stats that she had some chest pain yesterday. The pain spread up her neck to her jaw line on both sides. The pain began to spread across her back. This happened twice yesterday. The episodes lasted about 5-10 minutes with both episodes with only a few seconds in between. This had never happened before. She has a slight heart murmur, but otherwise, no hearty problems. There is a strong family history of heart problems involving both of her parents. Denies chest pain and difficulty breathing at present. She hasn't had any more of this since the second episode yesterday.  Has the patient traveled out of the country within the last 30 days? ---No  Does the patient have any new or worsening symptoms? ---Yes  Will a triage be completed? ---Yes  Related visit to physician within the last 2 weeks? ---No  Does the PT have any chronic conditions? (i.e. diabetes, asthma, etc.) ---Yes  List chronic conditions. ---RA, HTN  Is the patient pregnant or possibly pregnant? (Ask all females between the ages of 37-55) ---No  Is this a behavioral health or substance abuse call? ---No     Guidelines    Guideline Title Affirmed Question Affirmed Notes  Chest Pain [1] Chest pain lasting <= 5 minutes AND [2] NO chest pain or cardiac symptoms now (Exceptions: pains lasting a few seconds)    Final Disposition User   See Physician within 24 Hours Sherilyn Cooter, RN, Thurmond Butts    Comments  Appointment scheduled for today at 4:00pm with Dr. Everlene Other.   Referrals  REFERRED TO PCP OFFICE   Disagree/Comply: Comply

## 2015-11-21 NOTE — Progress Notes (Signed)
Pre visit review using our clinic review tool, if applicable. No additional management support is needed unless otherwise documented below in the visit note. 

## 2015-11-21 NOTE — Telephone Encounter (Signed)
This is the 4pm on your schedule, thanks

## 2015-11-21 NOTE — Patient Instructions (Signed)
Chest pain is atypical and likely anxiety driven.  We'll call with your cardiology appt.  Follow-up in 6 weeks.  Take care  Dr.Prithvi Kooi

## 2015-11-22 DIAGNOSIS — R079 Chest pain, unspecified: Secondary | ICD-10-CM | POA: Insufficient documentation

## 2015-11-22 DIAGNOSIS — M069 Rheumatoid arthritis, unspecified: Secondary | ICD-10-CM | POA: Insufficient documentation

## 2015-11-22 LAB — COMPREHENSIVE METABOLIC PANEL
ALBUMIN: 4.2 g/dL (ref 3.5–5.2)
ALK PHOS: 67 U/L (ref 39–117)
ALT: 15 U/L (ref 0–35)
AST: 18 U/L (ref 0–37)
BUN: 9 mg/dL (ref 6–23)
CO2: 28 mEq/L (ref 19–32)
CREATININE: 0.78 mg/dL (ref 0.40–1.20)
Calcium: 9.3 mg/dL (ref 8.4–10.5)
Chloride: 103 mEq/L (ref 96–112)
GFR: 85.37 mL/min (ref >60.00)
GLUCOSE: 89 mg/dL (ref 70–99)
POTASSIUM: 3.7 meq/L (ref 3.5–5.1)
SODIUM: 139 meq/L (ref 135–145)
TOTAL PROTEIN: 7.2 g/dL (ref 6.0–8.3)
Total Bilirubin: 0.5 mg/dL (ref 0.2–1.2)

## 2015-11-22 LAB — LIPID PANEL
CHOL/HDL RATIO: 3
Cholesterol: 179 mg/dL (ref 0–200)
HDL: 53.2 mg/dL (ref >39.00)
LDL Cholesterol: 108 mg/dL — ABNORMAL HIGH (ref 0–99)
NONHDL: 126.07
Triglycerides: 91 mg/dL (ref 0.0–149.0)
VLDL: 18.2 mg/dL (ref 0.0–40.0)

## 2015-11-22 LAB — CBC
HCT: 42.9 % (ref 36.0–46.0)
Hemoglobin: 14.7 g/dL (ref 12.0–15.0)
MCHC: 34.3 g/dL (ref 30.0–36.0)
MCV: 95.2 fl (ref 78.0–100.0)
Platelets: 303 10*3/uL (ref 150.0–400.0)
RBC: 4.51 Mil/uL (ref 3.87–5.11)
RDW: 13.6 % (ref 11.5–15.5)
WBC: 7.2 10*3/uL (ref 4.0–10.5)

## 2015-11-22 LAB — HEMOGLOBIN A1C: HEMOGLOBIN A1C: 5.4 % (ref 4.6–6.5)

## 2015-11-22 LAB — TSH: TSH: 1.85 u[IU]/mL (ref 0.35–4.50)

## 2015-11-22 NOTE — Assessment & Plan Note (Signed)
New Problem. 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 Discussed with cardiology. Recommended referral for echo, Holter. Will arrange. I suspect that her chest pain is noncardiac and will likely anxiety driven. Treating with Klonopin.

## 2015-11-22 NOTE — Progress Notes (Signed)
Subjective:  Patient ID: Isabella Burch, female    DOB: 04/15/1971  Age: 45 y.o. MRN: [id number]  CC: Chest pain  HPI:  45 year old female with a past medical history of hypertension and RA presents with complaints of chest pain.  Chest pain  Patient reports that yesterday she had 2 episodes of chest pain.  Pain was located in the upper chest bilaterally.  She reports radiation to the jaws and upper back bilaterally as well.  Occurred at rest.   No associated shortness of breath, diaphoresis, nausea, vomiting.  Lasted a few minutes and resolved spontaneously.  No known exacerbating or relieving factors.  Patient does note that she has a long-standing history of anxiety/panic.  No other complaints at this time.  Social Hx   Social History   Social History  . Marital status: Married    Spouse name: Isabella Burch  . Number of children: 2  . Years of education: 12   Occupational History  . 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 [employer]     Part-time job   Social History Main Topics  . Smoking status: Current Every Day Smoker    Packs/day: 0.50    Years: 25.00    Types: Cigarettes  . Smokeless tobacco: Never Used  . Alcohol use No  . Drug use: No  . Sexual activity: Not Asked   Other Topics Concern  . None   Social History Narrative   Isabella Burch grew up in [place]. She lives at home with her husband, two children and a granddaughter. They have 2 dogs (great pyreneees and dachbull).  She works for the [employer] system in [employer] and also works part time at the [employer] in [place]. She enjoys working and being around children.    Review of Systems  Constitutional: Negative for diaphoresis.  Respiratory: Negative for shortness of breath.   Cardiovascular: Positive for chest pain.  Gastrointestinal: Negative for nausea and vomiting.   Objective:  BP (!) 153/98 (BP Location:  Left Arm, Patient Position: Sitting, Cuff Size: Normal)   Pulse 85   Temp 98.5 F (36.9 C) (Oral)   Wt 118 lb 4 oz (53.6 kg)   SpO2 (!) 85%   BMI 19.09 kg/m   BP/Weight 11/21/2015 05/17/2015 11/29/5724  Systolic BP 203 559 741  Diastolic BP 98 91 92  Wt. (Lbs) 118.25 125 125.4  BMI 19.09 20.19 19.64   Physical Exam  Constitutional: She is oriented to person, place, and time. She appears well-developed. No distress.  Cardiovascular: Normal rate and regular rhythm.   Pulmonary/Chest: Effort normal. She has no wheezes. She has no rales.  Neurological: She is alert and oriented to person, place, and time.  Psychiatric:  Anxious.  Vitals reviewed.  Lab Results  Component Value Date   WBC 4.7 05/17/2015   HGB 13.3 05/17/2015   HCT 38.9 05/17/2015   PLT 266 05/17/2015   GLUCOSE 107 (H) 05/17/2015   NA 133 (L) 05/17/2015   K 4.0 05/17/2015   CL 102 05/17/2015   CREATININE 0.79 05/17/2015   BUN 14 05/17/2015   CO2 26 05/17/2015   EKG - rate 61. Several P-wave morphologies noted with conduction one-to-one (wandering pacemaker). No ST or T-wave changes. No signs of ischemia.  Assessment & Plan:   Problem List Items Addressed This Visit    Chest pain - Primary    New Problem. 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 Discussed with cardiology. Recommended referral for echo, Holter. Will arrange. I suspect that her chest pain is noncardiac and will likely anxiety driven. Treating with Klonopin.      Relevant Orders   EKG 12-Lead (Completed)   CBC   Comp Met (CMET)   TSH    Other Visit Diagnoses    Elevated glucose       Relevant Orders   HgB A1c   Screening, lipid       Relevant Orders   Lipid Profile   Wandering pacemaker       Relevant Orders   Ambulatory referral to Cardiology      Meds ordered this encounter  Medications  . clonazePAM (KLONOPIN) 0.5 MG tablet    Sig: Take 1 tablet  (0.5 mg total) by mouth 2 (two) times daily as needed for anxiety.    Dispense:  20 tablet    Refill:  1   Follow-up: PRN  [place]

## 2015-12-12 NOTE — Progress Notes (Deleted)
Cardiology Office Note   Date:  12/12/2015   ID:  Isabella Burch, DOB [birth year], MRN [id number]  Referring Doctor:  Carollee Leitz, NP   Cardiologist:   Almond Lint, MD   Reason for consultation:  No chief complaint on file.     History of Present Illness: Isabella Burch is a 45 y.o. female who presents for ***   ROS:  Please see the history of present illness. Aside from mentioned under HPI, all other systems are reviewed and negative.     Past Medical History:  Diagnosis Date  . Extensor tendon rupture, non-traumatic, hand and wrist    right  . Heart murmur   . History of kidney stones   . Hypertension   . Rheumatoid arthritis (HCC) dx 2006   BILATERAL HANDS AND RIGHT ANKLE    Past Surgical History:  Procedure Laterality Date  . CYSTOSCOPY WITH URETEROSCOPY, STONE BASKETRY AND STENT PLACEMENT  2011  . LEFT WRIST EXTENSOR TENDON REPAIR    . TENDON TRANSFER Right 11/09/2014   Procedure: RIGHT THUMB EIP TO EPL TENDON TRANSFER;  Surgeon: Bradly Bienenstock, MD;  Location: [facility];  Service: Orthopedics;  Laterality: Right;  . TUBAL LIGATION       reports that she has been smoking Cigarettes.  She has a 12.50 pack-year smoking history. She has never used smokeless tobacco. She reports that she does not drink alcohol or use drugs.   family history includes Diabetes in her father; Hyperlipidemia in her father; Hypertension in her father; Hypothyroidism in her mother.   Outpatient Medications Prior to Visit  Medication Sig Dispense Refill  . carisoprodol (SOMA) 350 MG tablet Take 1 tablet (350 mg total) by mouth 3 (three) times daily as needed for muscle spasms. 15 tablet 0  . clonazePAM (KLONOPIN) 0.5 MG tablet Take 1 tablet (0.5 mg total) by mouth 2 (two) times daily as needed for anxiety. 20 tablet 1  . hydrochlorothiazide (HYDRODIURIL) 25 MG tablet TAKE 1/2 TABLET BY MOUTH DAILY 45 tablet 1  . leflunomide (ARAVA) 10 MG tablet TAKE 1 TABLET BY MOUTH  ONCE A DAY    . ORENCIA 125 MG/ML SOSY      No facility-administered medications prior to visit.      Allergies: Enbrel [etanercept] and Humira [adalimumab]    PHYSICAL EXAM: VS:  There were no vitals taken for this visit. , There is no height or weight on file to calculate BMI. Wt Readings from Last 3 Encounters:  11/21/15 118 lb 4 oz (53.6 kg)  05/17/15 125 lb (56.7 kg)  12/04/14 125 lb 6.4 oz (56.9 kg)    GENERAL:  well developed, well nourished, *** obese, not in acute distress HEENT: normocephalic, pink conjunctivae, anicteric sclerae, no xanthelasma, normal dentition, oropharynx clear NECK:  no neck vein engorgement, JVP normal, no hepatojugular reflux, carotid upstroke brisk and symmetric, no bruit, no thyromegaly, no lymphadenopathy LUNGS:  good respiratory effort, clear to auscultation bilaterally CV:  PMI not displaced, no thrills, no lifts, S1 and S2 within normal limits, no palpable S3 or S4, no murmurs, no rubs, no gallops ABD:  Soft, nontender, nondistended, normoactive bowel sounds, no abdominal aortic bruit, no hepatomegaly, no splenomegaly MS: nontender back, no kyphosis, no scoliosis, no joint deformities EXT:  2+ DP/PT pulses, no edema, no varicosities, no cyanosis, no clubbing SKIN: warm, nondiaphoretic, normal turgor, no ulcers NEUROPSYCH: alert, oriented to person, place, and time, sensory/motor grossly intact, normal mood, appropriate affect  Recent  Labs: 11/21/2015: ALT 15; BUN 9; Creatinine, Ser 0.78; Hemoglobin 14.7; Platelets 303.0; Potassium 3.7; Sodium 139; TSH 1.85   Lipid Panel    Component Value Date/Time   CHOL 179 11/21/2015 1642   TRIG 91.0 11/21/2015 1642   HDL 53.20 11/21/2015 1642   CHOLHDL 3 11/21/2015 1642   VLDL 18.2 11/21/2015 1642   LDLCALC 108 (H) 11/21/2015 1642     Other studies Reviewed:  EKG:  The ekg from *** was personally reviewed by me and it revealed ***  Additional studies/ records that were reviewed personally reviewed  by me today include: ***   ASSESSMENT AND PLAN:    Current medicines are reviewed at length with the patient today.  The patient {ACTIONS; HAS/DOES NOT HAVE:19233} concerns regarding medicines.  Labs/ tests ordered today include: No orders of the defined types were placed in this encounter.   I had a lengthy and detailed discussion with the patient regarding diagnoses, prognosis, diagnostic options, treatment options ***, and side effects of medications.   I counseled the patient on importance of lifestyle modification including heart healthy diet, regular physical activity *** , and smoking cessation.   Disposition:   FU with undersigned after tests ***   Signed, Almond Lint, MD  12/12/2015 4:26 PM    [facility]  This note was generated in part with voice recognition software and I apologize for any typographical errors that were not detected and corrected.

## 2015-12-13 ENCOUNTER — Encounter: Payer: Self-pay | Admitting: *Deleted

## 2015-12-13 ENCOUNTER — Ambulatory Visit: Payer: BC Managed Care – PPO | Admitting: Cardiology

## 2016-01-02 ENCOUNTER — Ambulatory Visit: Payer: BC Managed Care – PPO | Admitting: Family Medicine

## 2016-01-02 DIAGNOSIS — Z0289 Encounter for other administrative examinations: Secondary | ICD-10-CM

## 2016-06-20 ENCOUNTER — Other Ambulatory Visit: Payer: Self-pay | Admitting: Family Medicine

## 2016-09-24 ENCOUNTER — Other Ambulatory Visit: Payer: Self-pay | Admitting: Family Medicine

## 2016-12-24 ENCOUNTER — Other Ambulatory Visit: Payer: Self-pay | Admitting: Family Medicine

## 2019-04-05 ENCOUNTER — Other Ambulatory Visit: Payer: Self-pay

## 2019-04-05 ENCOUNTER — Encounter: Payer: Self-pay | Admitting: Adult Health

## 2019-04-05 ENCOUNTER — Ambulatory Visit: Payer: BC Managed Care – PPO | Admitting: Adult Health

## 2019-04-05 VITALS — BP 140/90 | HR 71 | Temp 97.0°F | Resp 16 | Ht 65.5 in | Wt 140.0 lb

## 2019-04-05 DIAGNOSIS — Z0000 Encounter for general adult medical examination without abnormal findings: Secondary | ICD-10-CM | POA: Diagnosis not present

## 2019-04-05 DIAGNOSIS — M069 Rheumatoid arthritis, unspecified: Secondary | ICD-10-CM

## 2019-04-05 DIAGNOSIS — I10 Essential (primary) hypertension: Secondary | ICD-10-CM

## 2019-04-05 DIAGNOSIS — F419 Anxiety disorder, unspecified: Secondary | ICD-10-CM | POA: Diagnosis not present

## 2019-04-05 MED ORDER — HYDROCHLOROTHIAZIDE 12.5 MG PO TABS: 12.5000 mg | ORAL_TABLET | Freq: Every day | ORAL | 0 refills | Status: DC

## 2019-04-05 NOTE — Progress Notes (Signed)
Isabella Burch  MRN: [id number] DOB: 1971-01-23  Subjective:  HPI   The patient is a 48 year old female who presents to establish care with practice. Patient reports that she feels fairly well today and would like to address history of elevated blood pressure. Patient states that her previous PCP was Dr. Lacinda Axon at [facility], she states that she sees a rheumatologist regularly  Dr. Linna Caprice. Patient reports that she has not had a gyneology examination or pap exam in over 15years.   She use to take HCTZ years ago. She stopped it.  Her biggest concern is her blood pressure and she has been getting elevated readings, she reports she had a reading 150/102 at her last doctor's appointment.  She denies chest pain or shortness of breath.  She denies any exertional or at rest pain.  She has a history of anxiety. She feels anxious at times. She has taken Klonopin in past and never really used.  Denies any homicidal or suicidal ideations. She would like to consider medication that may help her anxiety.   Rheumatoid arthritis diagnosis, she was diagnosed at [place] 47 years old. She is doing well.  She sees Dr. [facility] rheumatology.  She was a previous patient of Dr. Lacinda Axon at [facility] for primary care.  Neurosurgery consult - Physical therapy.  Smoker 5-6 cigarettes a day.  Denies any drug use.  Denies any alcohol use.   She has not has a menstrual cycle for two months. She had a regular cycle in August. Tubal ligation age 59.  She denies any chance of pregnancy.  She has two children.   She is unable to take Flu Shot she reports per Dr. Duard Brady. She declined.   CBC only abnormal 32.7 MCH on 03/29/2019 Chem profile - liver function normal   She denies any rash, new or changing skin.   Patient  denies any fever, body aches,chills, rash, chest pain, shortness of breath, nausea, vomiting, or diarrhea.   Neurosurgery office visit B/P 173/107 heartrate 75    Patient Active Problem  List   Diagnosis Date Noted  . Rheumatoid arthritis ([place]) 11/22/2015  . Chest pain 11/22/2015  . Panic attacks 12/04/2014  . Benign essential HTN 10/08/2014  . Routine general medical examination at a health care facility 02/25/2013   Current Meds  Medication Sig  . leflunomide (ARAVA) 10 MG tablet TAKE 1 TABLET BY MOUTH ONCE A DAY  . ORENCIA 125 MG/ML SOSY   . tiZANidine (ZANAFLEX) 4 MG tablet Take by mouth.  . [DISCONTINUED] metaxalone (SKELAXIN) 800 MG tablet Take 800 mg by mouth 3 (three) times daily.   Past Medical History:  Diagnosis Date  . Anxiety   . Extensor tendon rupture, non-traumatic, hand and wrist    right  . Heart murmur   . History of kidney stones   . Hypertension   . Rheumatoid arthritis ([place]) dx 2006   BILATERAL HANDS AND RIGHT ANKLE    Social History   Socioeconomic History  . Marital status: Married    Spouse name: Katelinn Justice  . Number of children: 2  . Years of education: 81  . Highest education level: Not on file  Occupational History  . 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 Occupation: Public librarian    Comment: Part-time job  Tobacco Use  . Smoking status: Current Every Day Smoker    Packs/day: 0.50  Years: 25.00    Pack years: 12.50    Types: Cigarettes  . Smokeless tobacco: Never Used  Substance and Sexual Activity  . Alcohol use: No    Alcohol/week: 0.0 standard drinks  . Drug use: No  . Sexual activity: Not on file  Other Topics Concern  . Not on file  Social History Narrative   Naida grew up in [place]. She lives at home with her husband, two children and a granddaughter. They have 2 dogs (great pyreneees and dachbull).  She works for the [employer] system in [employer] and also works part time at the [employer] in [facility]. She enjoys working and being around children.    Social Determinants of Health    Financial Resource Strain:   . Difficulty of Paying Living Expenses: Not on file  Food Insecurity:   . Worried About Programme researcher, broadcasting/film/video in the Last Year: Not on file  . Ran Out of Food in the Last Year: Not on file  Transportation Needs:   . Lack of Transportation (Medical): Not on file  . Lack of Transportation (Non-Medical): Not on file  Physical Activity:   . Days of Exercise per Week: Not on file  . Minutes of Exercise per Session: Not on file  Stress:   . Feeling of Stress : Not on file  Social Connections:   . Frequency of Communication with Friends and Family: Not on file  . Frequency of Social Gatherings with Friends and Family: Not on file  . Attends Religious Services: Not on file  . Active Member of Clubs or Organizations: Not on file  . Attends Banker Meetings: Not on file  . Marital Status: Not on file  Intimate Partner Violence:   . Fear of Current or Ex-Partner: Not on file  . Emotionally Abused: Not on file  . Physically Abused: Not on file  . Sexually Abused: Not on file      Allergies  Allergen Reactions  . Enbrel [Etanercept] Swelling  . Humira [Adalimumab] Other (See Comments)    kidney dysfunction    Review of Systems  Constitutional: Negative for chills, diaphoresis, fever, malaise/fatigue and weight loss.       Appetite change  HENT: Negative.   Eyes: Negative.   Respiratory: Negative.   Cardiovascular: Negative.           Gastrointestinal: Negative.   Genitourinary: Negative.        No menses since september had regular cycle in august. Tubal ligation. Declines chance of pregnancy. Denies any break through bleeding or any pelvic pain.   Musculoskeletal: Positive for back pain, joint pain and myalgias. Negative for falls and neck pain.        Not new- chronic followed by rheumatology and neurosurgery. Currently starting physical therapy. See care everywhere. Denies any new or worsening symptoms.  No bowel or bladder loss of  control. No saddle paresthesias.   Skin: Negative.   Neurological: Negative.   Endo/Heme/Allergies: Negative.   Psychiatric/Behavioral: Negative for depression, hallucinations, memory loss, substance abuse and suicidal ideas. The patient is nervous/anxious. The patient does not have insomnia.     Objective:  BP 140/90   Pulse 71   Temp (!) 96.6 F (35.9 C) (Oral)   Resp 16   Ht 5' 5.5" (1.664 m)   Wt 140 lb (63.5 kg)   LMP  (Within Months) Comment: patient reports absence of menstration, she states last cycle was in September  BMI 22.94 kg/m   Physical Exam  Constitutional: She is oriented to person, place, and time. No distress.  HENT:  Head: Normocephalic and atraumatic.  Eyes: Pupils are equal, round, and reactive to light. Conjunctivae and EOM are normal. Right eye exhibits no discharge. Left eye exhibits no discharge. No scleral icterus.  Neck: No JVD present. No tracheal deviation present. No thyromegaly present.  Cardiovascular: Normal rate and regular rhythm. Exam reveals no gallop and no friction rub.  No murmur heard. Pulmonary/Chest: Breath sounds normal. No stridor. No respiratory distress. She has no wheezes. She has no rales. She exhibits no tenderness.  Abdominal: Soft. Bowel sounds are normal. She exhibits no distension and no mass. There is no abdominal tenderness. There is no rebound and no guarding.  Genitourinary:    Genitourinary Comments: deferred until next visit in one month due to patient has a neurosurgery follow up at 1115 today.    Musculoskeletal:        General: Normal range of motion.     Cervical back: Normal range of motion and neck supple.  Lymphadenopathy:    She has no cervical adenopathy.  Neurological: She is alert and oriented to person, place, and time. She has normal reflexes. She displays normal reflexes. No cranial nerve deficit. She exhibits normal muscle tone. Gait normal. Coordination normal. GCS score is 15.  Skin: Skin is warm and  dry. No rash noted. She is not diaphoretic. No erythema. No pallor.  Psychiatric: Mood, memory, affect and judgment normal.  Vitals reviewed.   Assessment and Plan :  1. Routine health maintenance  - TSH; Future - POCT Urinalysis Dipstick - VITAMIN D 25 Hydroxy (Vit-D Deficiency, Fractures); Future - Lipid panel; Future - Lipid panel - VITAMIN D 25 Hydroxy (Vit-D Deficiency, Fractures) - TSH - Comprehensive Metabolic Panel (CMET); Future - Comprehensive Metabolic Panel (CMET)  2. Rheumatoid arthritis involving multiple sites, unspecified whether rheumatoid factor present (HCC) Continue follow up with Dr. Saverio Danker   3. Benign essential HTN Will await labs and restart your HCTC 12.5 mg daily by mouth. Keep records of readings.   4.Anxiety GAD 7 - 13 today. Will check TSH and labs and consider Lexapro. Patient will clear with rheumatology that he is ok with her being on Lexapro as well as HCTZ with her other medications. Adverse reactions discussed and need for appropriate rechecks of electrolytes. No other CNS depressants advised. Also recommend therapy/counseling.   Need for gynecology/ pap smear at next visit. Urinalysis screening at next visit she has a 1115 appt today virtually and did not have time at  Today's visit.   Return in about 1 month (around 05/06/2019), or if symptoms worsen or fail to improve, for Go to Emergency room/ urgent care if worse, Call 911 for emergencies.   Advised patient call the office or your primary care doctor for an appointment if no improvement within 72 hours or if any symptoms change or worsen at any time  Advised ER or urgent Care if after hours or on weekend. Call 911 for emergency symptoms at any time.Patinet verbalized understanding of all instructions given/reviewed and treatment plan and has no further questions or concerns at this time.    The entirety of the information documented in the History of Present Illness, Review of Systems  and Physical Exam were personally obtained by me. Portions of this information were initially documented by the  Certified Medical Assistant whose name is documented in Epic and reviewed by me for thoroughness  and accuracy.  I have personally performed the exam and reviewed the chart and it is accurate to the best of my knowledge.  Museum/gallery conservator has been used and any errors in dictation or transcription are unintentional.  Eula Fried. Flinchum FNP-C  [facility]

## 2019-04-05 NOTE — Patient Instructions (Addendum)
Living With Anxiety  After being diagnosed with an anxiety disorder, you may be relieved to know why you have felt or behaved a certain way. It is natural to also feel overwhelmed about the treatment ahead and what it will mean for your life. With care and support, you can manage this condition and recover from it. How to cope with anxiety Dealing with stress Stress is your body's reaction to life changes and events, both good and bad. Stress can last just a few hours or it can be ongoing. Stress can play a major role in anxiety, so it is important to learn both how to cope with stress and how to think about it differently. Talk with your health care provider or a counselor to learn more about stress reduction. He or she may suggest some stress reduction techniques, such as:  Music therapy. This can include creating or listening to music that you enjoy and that inspires you.  Mindfulness-based meditation. This involves being aware of your normal breaths, rather than trying to control your breathing. It can be done while sitting or walking.  Centering prayer. This is a kind of meditation that involves focusing on a word, phrase, or sacred image that is meaningful to you and that brings you peace.  Deep breathing. To do this, expand your stomach and inhale slowly through your nose. Hold your breath for 3-5 seconds. Then exhale slowly, allowing your stomach muscles to relax.  Self-talk. This is a skill where you identify thought patterns that lead to anxiety reactions and correct those thoughts.  Muscle relaxation. This involves tensing muscles then relaxing them. Choose a stress reduction technique that fits your lifestyle and personality. Stress reduction techniques take time and practice. Set aside 5-15 minutes a day to do them. Therapists can offer training in these techniques. The training may be covered by some insurance plans. Other things you can do to manage stress include:  Keeping a  stress diary. This can help you learn what triggers your stress and ways to control your response.  Thinking about how you respond to certain situations. You may not be able to control everything, but you can control your reaction.  Making time for activities that help you relax, and not feeling guilty about spending your time in this way. Therapy combined with coping and stress-reduction skills provides the best chance for successful treatment. Medicines Medicines can help ease symptoms. Medicines for anxiety include:  Anti-anxiety drugs.  Antidepressants.  Beta-blockers. Medicines may be used as the main treatment for anxiety disorder, along with therapy, or if other treatments are not working. Medicines should be prescribed by a health care provider. Relationships Relationships can play a big part in helping you recover. Try to spend more time connecting with trusted friends and family members. Consider going to couples counseling, taking family education classes, or going to family therapy. Therapy can help you and others better understand the condition. How to recognize changes in your condition Everyone has a different response to treatment for anxiety. Recovery from anxiety happens when symptoms decrease and stop interfering with your daily activities at home or work. This may mean that you will start to:  Have better concentration and focus.  Sleep better.  Be less irritable.  Have more energy.  Have improved memory. It is important to recognize when your condition is getting worse. Contact your health care provider if your symptoms interfere with home or work and you do not feel like your condition is improving. Where  to find help and support: You can get help and support from these sources:  Self-help groups.  Online and [employer].  A trusted spiritual leader.  Couples counseling.  Family education classes.  Family therapy. Follow these instructions  at home:  Eat a healthy diet that includes plenty of vegetables, fruits, whole grains, low-fat dairy products, and lean protein. Do not eat a lot of foods that are high in solid fats, added sugars, or salt.  Exercise. Most adults should do the following: ? Exercise for at least 150 minutes each week. The exercise should increase your heart rate and make you sweat (moderate-intensity exercise). ? Strengthening exercises at least twice a week.  Cut down on caffeine, tobacco, alcohol, and other potentially harmful substances.  Get the right amount and quality of sleep. Most adults need 7-9 hours of sleep each night.  Make choices that simplify your life.  Take over-the-counter and prescription medicines only as told by your health care provider.  Avoid caffeine, alcohol, and certain over-the-counter cold medicines. These may make you feel worse. Ask your pharmacist which medicines to avoid.  Keep all follow-up visits as told by your health care provider. This is important. Questions to ask your health care provider  Would I benefit from therapy?  How often should I follow up with a health care provider?  How long do I need to take medicine?  Are there any long-term side effects of my medicine?  Are there any alternatives to taking medicine? Contact a health care provider if:  You have a hard time staying focused or finishing daily tasks.  You spend many hours a day feeling worried about everyday life.  You become exhausted by worry.  You start to have headaches, feel tense, or have nausea.  You urinate more than normal.  You have diarrhea. Get help right away if:  You have a racing heart and shortness of breath.  You have thoughts of hurting yourself or others. If you ever feel like you may hurt yourself or others, or have thoughts about taking your own life, get help right away. You can go to your nearest emergency department or call:  Your local emergency services  (911 in the [place]  A suicide crisis helpline, such as the National Suicide Prevention Lifeline at (940)587-1363. This is open 24-hours a day. Summary  Taking steps to deal with stress can help calm you.  Medicines cannot cure anxiety disorders, but they can help ease symptoms.  Family, friends, and partners can play a big part in helping you recover from an anxiety disorder. This information is not intended to replace advice given to you by your health care provider. Make sure you discuss any questions you have with your health care provider. Document Released: 04/01/2016 Document Revised: 03/20/2017 Document Reviewed: 04/01/2016 Elsevier Patient Education  2020 [employer]. Hypertension, Adult Hypertension is another name for high blood pressure. High blood pressure forces your heart to work harder to pump blood. This can cause problems over time. There are two numbers in a blood pressure reading. There is a top number (systolic) over a bottom number (diastolic). It is best to have a blood pressure that is below 120/80. Healthy choices can help lower your blood pressure, or you may need medicine to help lower it. What are the causes? The cause of this condition is not known. Some conditions may be related to high blood pressure. What increases the risk?  Smoking.  Having type 2 diabetes mellitus, high cholesterol,  or both.  Not getting enough exercise or physical activity.  Being overweight.  Having too much fat, sugar, calories, or salt (sodium) in your diet.  Drinking too much alcohol.  Having long-term (chronic) kidney disease.  Having a family history of high blood pressure.  Age. Risk increases with age.  Race. You may be at higher risk if you are African American.  Gender. Men are at higher risk than women before age 74. After age 18, women are at higher risk than men.  Having obstructive sleep apnea.  Stress. What are the signs or symptoms?  High blood  pressure may not cause symptoms. Very high blood pressure (hypertensive crisis) may cause: ? Headache. ? Feelings of worry or nervousness (anxiety). ? Shortness of breath. ? Nosebleed. ? A feeling of being sick to your stomach (nausea). ? Throwing up (vomiting). ? Changes in how you see. ? Very bad chest pain. ? Seizures. How is this treated?  This condition is treated by making healthy lifestyle changes, such as: ? Eating healthy foods. ? Exercising more. ? Drinking less alcohol.  Your health care provider may prescribe medicine if lifestyle changes are not enough to get your blood pressure under control, and if: ? Your top number is above 130. ? Your bottom number is above 80.  Your personal target blood pressure may vary. Follow these instructions at home: Eating and drinking   If told, follow the DASH eating plan. To follow this plan: ? Fill one half of your plate at each meal with fruits and vegetables. ? Fill one fourth of your plate at each meal with whole grains. Whole grains include whole-wheat pasta, brown rice, and whole-grain bread. ? Eat or drink low-fat dairy products, such as skim milk or low-fat yogurt. ? Fill one fourth of your plate at each meal with low-fat (lean) proteins. Low-fat proteins include fish, chicken without skin, eggs, beans, and tofu. ? Avoid fatty meat, cured and processed meat, or chicken with skin. ? Avoid pre-made or processed food.  Eat less than 1,500 mg of salt each day.  Do not drink alcohol if: ? Your doctor tells you not to drink. ? You are pregnant, may be pregnant, or are planning to become pregnant.  If you drink alcohol: ? Limit how much you use to:  0-1 drink a day for women.  0-2 drinks a day for men. ? Be aware of how much alcohol is in your drink. In the [place] one drink equals one 12 oz bottle of beer (355 mL), one 5 oz glass of wine (148 mL), or one 1 oz glass of hard liquor (44 mL). Lifestyle   Work with your  doctor to stay at a healthy weight or to lose weight. Ask your doctor what the best weight is for you.  Get at least 30 minutes of exercise most days of the week. This may include walking, swimming, or biking.  Get at least 30 minutes of exercise that strengthens your muscles (resistance exercise) at least 3 days a week. This may include lifting weights or doing Pilates.  Do not use any products that contain nicotine or tobacco, such as cigarettes, e-cigarettes, and chewing tobacco. If you need help quitting, ask your doctor.  Check your blood pressure at home as told by your doctor.  Keep all follow-up visits as told by your doctor. This is important. Medicines  Take over-the-counter and prescription medicines only as told by your doctor. Follow directions carefully.  Do not skip doses  of blood pressure medicine. The medicine does not work as well if you skip doses. Skipping doses also puts you at risk for problems.  Ask your doctor about side effects or reactions to medicines that you should watch for. Contact a doctor if you:  Think you are having a reaction to the medicine you are taking.  Have headaches that keep coming back (recurring).  Feel dizzy.  Have swelling in your ankles.  Have trouble with your vision. Get help right away if you:  Get a very bad headache.  Start to feel mixed up (confused).  Feel weak or numb.  Feel faint.  Have very bad pain in your: ? Chest. ? Belly (abdomen).  Throw up more than once.  Have trouble breathing. Summary  Hypertension is another name for high blood pressure.  High blood pressure forces your heart to work harder to pump blood.  For most people, a normal blood pressure is less than 120/80.  Making healthy choices can help lower blood pressure. If your blood pressure does not get lower with healthy choices, you may need to take medicine. This information is not intended to replace advice given to you by your health  care provider. Make sure you discuss any questions you have with your health care provider. Document Released: 09/24/2007 Document Revised: 12/16/2017 Document Reviewed: 12/16/2017 Elsevier Patient Education  2020 [place] Maintenance, Female Adopting a healthy lifestyle and getting preventive care are important in promoting health and wellness. Ask your health care provider about:  The right schedule for you to have regular tests and exams.  Things you can do on your own to prevent diseases and keep yourself healthy. What should I know about diet, weight, and exercise? Eat a healthy diet   Eat a diet that includes plenty of vegetables, fruits, low-fat dairy products, and lean protein.  Do not eat a lot of foods that are high in solid fats, added sugars, or sodium. Maintain a healthy weight Body mass index (BMI) is used to identify weight problems. It estimates body fat based on height and weight. Your health care provider can help determine your BMI and help you achieve or maintain a healthy weight. Get regular exercise Get regular exercise. This is one of the most important things you can do for your health. Most adults should:  Exercise for at least 150 minutes each week. The exercise should increase your heart rate and make you sweat (moderate-intensity exercise).  Do strengthening exercises at least twice a week. This is in addition to the moderate-intensity exercise.  Spend less time sitting. Even light physical activity can be beneficial. Watch cholesterol and blood lipids Have your blood tested for lipids and cholesterol at 47 years of age, then have this test every 5 years. Have your cholesterol levels checked more often if:  Your lipid or cholesterol levels are high.  You are older than 47 years of age.  You are at high risk for heart disease. What should I know about cancer screening? Depending on your health history and family history, you may need to have  cancer screening at various ages. This may include screening for:  Breast cancer.  Cervical cancer.  Colorectal cancer.  Skin cancer.  Lung cancer. What should I know about heart disease, diabetes, and high blood pressure? Blood pressure and heart disease  High blood pressure causes heart disease and increases the risk of stroke. This is more likely to develop in people who have high blood pressure readings,  are of African descent, or are overweight.  Have your blood pressure checked: ? Every 3-5 years if you are 72-77 years of age. ? Every year if you are 39 years old or older. Diabetes Have regular diabetes screenings. This checks your fasting blood sugar level. Have the screening done:  Once every three years after age 13 if you are at a normal weight and have a low risk for diabetes.  More often and at a younger age if you are overweight or have a high risk for diabetes. What should I know about preventing infection? Hepatitis B If you have a higher risk for hepatitis B, you should be screened for this virus. Talk with your health care provider to find out if you are at risk for hepatitis B infection. Hepatitis C Testing is recommended for:  Everyone born from [birth year] through 1964.  Anyone with known risk factors for hepatitis C. Sexually transmitted infections (STIs)  Get screened for STIs, including gonorrhea and chlamydia, if: ? You are sexually active and are younger than 47 years of age. ? You are older than 47 years of age and your health care provider tells you that you are at risk for this type of infection. ? Your sexual activity has changed since you were last screened, and you are at increased risk for chlamydia or gonorrhea. Ask your health care provider if you are at risk.  Ask your health care provider about whether you are at high risk for HIV. Your health care provider may recommend a prescription medicine to help prevent HIV infection. If you choose to take  medicine to prevent HIV, you should first get tested for HIV. You should then be tested every 3 months for as long as you are taking the medicine. Pregnancy  If you are about to stop having your period (premenopausal) and you may become pregnant, seek counseling before you get pregnant.  Take 400 to 800 micrograms (mcg) of folic acid every day if you become pregnant.  Ask for birth control (contraception) if you want to prevent pregnancy. Osteoporosis and menopause Osteoporosis is a disease in which the bones lose minerals and strength with aging. This can result in bone fractures. If you are 59 years old or older, or if you are at risk for osteoporosis and fractures, ask your health care provider if you should:  Be screened for bone loss.  Take a calcium or vitamin D supplement to lower your risk of fractures.  Be given hormone replacement therapy (HRT) to treat symptoms of menopause. Follow these instructions at home: Lifestyle  Do not use any products that contain nicotine or tobacco, such as cigarettes, e-cigarettes, and chewing tobacco. If you need help quitting, ask your health care provider.  Do not use street drugs.  Do not share needles.  Ask your health care provider for help if you need support or information about quitting drugs. Alcohol use  Do not drink alcohol if: ? Your health care provider tells you not to drink. ? You are pregnant, may be pregnant, or are planning to become pregnant.  If you drink alcohol: ? Limit how much you use to 0-1 drink a day. ? Limit intake if you are breastfeeding.  Be aware of how much alcohol is in your drink. In the [place] one drink equals one 12 oz bottle of beer (355 mL), one 5 oz glass of wine (148 mL), or one 1 oz glass of hard liquor (44 mL). General instructions  Schedule regular  health, dental, and eye exams.  Stay current with your vaccines.  Tell your health care provider if: ? You often feel depressed. ? You have  ever been abused or do not feel safe at home. Summary  Adopting a healthy lifestyle and getting preventive care are important in promoting health and wellness.  Follow your health care provider's instructions about healthy diet, exercising, and getting tested or screened for diseases.  Follow your health care provider's instructions on monitoring your cholesterol and blood pressure. This information is not intended to replace advice given to you by your health care provider. Make sure you discuss any questions you have with your health care provider. Document Released: 10/21/2010 Document Revised: 03/31/2018 Document Reviewed: 03/31/2018 Elsevier Patient Education  2020 [employer]  DASH Eating Plan DASH stands for "Dietary Approaches to Stop Hypertension." The DASH eating plan is a healthy eating plan that has been shown to reduce high blood pressure (hypertension). It may also reduce your risk for type 2 diabetes, heart disease, and stroke. The DASH eating plan may also help with weight loss. What are tips for following this plan?  General guidelines  Avoid eating more than 2,300 mg (milligrams) of salt (sodium) a day. If you have hypertension, you may need to reduce your sodium intake to 1,500 mg a day.  Limit alcohol intake to no more than 1 drink a day for nonpregnant women and 2 drinks a day for men. One drink equals 12 oz of beer, 5 oz of wine, or 1 oz of hard liquor.  Work with your health care provider to maintain a healthy body weight or to lose weight. Ask what an ideal weight is for you.  Get at least 30 minutes of exercise that causes your heart to beat faster (aerobic exercise) most days of the week. Activities may include walking, swimming, or biking.  Work with your health care provider or diet and nutrition specialist (dietitian) to adjust your eating plan to your individual calorie needs. Reading food labels   Check food labels for the amount of sodium per serving.  Choose foods with less than 5 percent of the Daily Value of sodium. Generally, foods with less than 300 mg of sodium per serving fit into this eating plan.  To find whole grains, look for the word "whole" as the first word in the ingredient list. Shopping  Buy products labeled as "low-sodium" or "no salt added."  Buy fresh foods. Avoid canned foods and premade or frozen meals. Cooking  Avoid adding salt when cooking. Use salt-free seasonings or herbs instead of table salt or sea salt. Check with your health care provider or pharmacist before using salt substitutes.  Do not fry foods. Cook foods using healthy methods such as baking, boiling, grilling, and broiling instead.  Cook with heart-healthy oils, such as olive, canola, soybean, or sunflower oil. Meal planning  Eat a balanced diet that includes: ? 5 or more servings of fruits and vegetables each day. At each meal, try to fill half of your plate with fruits and vegetables. ? Up to 6-8 servings of whole grains each day. ? Less than 6 oz of lean meat, poultry, or fish each day. A 3-oz serving of meat is about the same size as a deck of cards. One egg equals 1 oz. ? 2 servings of low-fat dairy each day. ? A serving of nuts, seeds, or beans 5 times each week. ? Heart-healthy fats. Healthy fats called Omega-3 fatty acids are found in foods  such as flaxseeds and coldwater fish, like sardines, salmon, and mackerel.  Limit how much you eat of the following: ? Canned or prepackaged foods. ? Food that is high in trans fat, such as fried foods. ? Food that is high in saturated fat, such as fatty meat. ? Sweets, desserts, sugary drinks, and other foods with added sugar. ? Full-fat dairy products.  Do not salt foods before eating.  Try to eat at least 2 vegetarian meals each week.  Eat more home-cooked food and less restaurant, buffet, and fast food.  When eating at a restaurant, ask that your food be prepared with less salt or no salt,  if possible. What foods are recommended? The items listed may not be a complete list. Talk with your dietitian about what dietary choices are best for you. Grains Whole-grain or whole-wheat bread. Whole-grain or whole-wheat pasta. Brown rice. [place]. Bulgur. Whole-grain and low-sodium cereals. Pita bread. Low-fat, low-sodium crackers. Whole-wheat flour tortillas. Vegetables Fresh or frozen vegetables (raw, steamed, roasted, or grilled). Low-sodium or reduced-sodium tomato and vegetable juice. Low-sodium or reduced-sodium tomato sauce and tomato paste. Low-sodium or reduced-sodium canned vegetables. Fruits All fresh, dried, or frozen fruit. Canned fruit in natural juice (without added sugar). Meat and other protein foods Skinless chicken or [place]. Ground chicken or [place]. Pork with fat trimmed off. Fish and seafood. Egg whites. Dried beans, peas, or lentils. Unsalted nuts, nut butters, and seeds. Unsalted canned beans. Lean cuts of beef with fat trimmed off. Low-sodium, lean deli meat. Dairy Low-fat (1%) or fat-free (skim) milk. Fat-free, low-fat, or reduced-fat cheeses. Nonfat, low-sodium ricotta or cottage cheese. Low-fat or nonfat yogurt. Low-fat, low-sodium cheese. Fats and oils Soft margarine without trans fats. Vegetable oil. Low-fat, reduced-fat, or light mayonnaise and salad dressings (reduced-sodium). Canola, safflower, olive, soybean, and sunflower oils. Avocado. Seasoning and other foods Herbs. Spices. Seasoning mixes without salt. Unsalted popcorn and pretzels. Fat-free sweets. What foods are not recommended? The items listed may not be a complete list. Talk with your dietitian about what dietary choices are best for you. Grains Baked goods made with fat, such as croissants, muffins, or some breads. Dry pasta or rice meal packs. Vegetables Creamed or fried vegetables. Vegetables in a cheese sauce. Regular canned vegetables (not low-sodium or reduced-sodium). Regular canned  tomato sauce and paste (not low-sodium or reduced-sodium). Regular tomato and vegetable juice (not low-sodium or reduced-sodium). Angie Fava. Olives. Fruits Canned fruit in a light or heavy syrup. Fried fruit. Fruit in cream or butter sauce. Meat and other protein foods Fatty cuts of meat. Ribs. Fried meat. Berniece Salines. Sausage. Bologna and other processed lunch meats. Salami. Fatback. Hotdogs. Bratwurst. Salted nuts and seeds. Canned beans with added salt. Canned or smoked fish. Whole eggs or egg yolks. Chicken or [place] with skin. Dairy Whole or 2% milk, cream, and half-and-half. Whole or full-fat cream cheese. Whole-fat or sweetened yogurt. Full-fat cheese. Nondairy creamers. Whipped toppings. Processed cheese and cheese spreads. Fats and oils Butter. Stick margarine. Lard. Shortening. Ghee. Bacon fat. Tropical oils, such as coconut, palm kernel, or palm oil. Seasoning and other foods Salted popcorn and pretzels. Onion salt, garlic salt, seasoned salt, table salt, and sea salt. Worcestershire sauce. Tartar sauce. Barbecue sauce. Teriyaki sauce. Soy sauce, including reduced-sodium. Steak sauce. Canned and packaged gravies. Fish sauce. Oyster sauce. Cocktail sauce. Horseradish that you find on the shelf. Ketchup. Mustard. Meat flavorings and tenderizers. Bouillon cubes. Hot sauce and Tabasco sauce. Premade or packaged marinades. Premade or packaged taco seasonings. Relishes. Regular salad  dressings. Where to find more information:  National Heart, Lung, and Blood Institute: PopSteam.iswww.nhlbi.nih.gov  American Heart Association: www.heart.org Summary  The DASH eating plan is a healthy eating plan that has been shown to reduce high blood pressure (hypertension). It may also reduce your risk for type 2 diabetes, heart disease, and stroke.  With the DASH eating plan, you should limit salt (sodium) intake to 2,300 mg a day. If you have hypertension, you may need to reduce your sodium intake to 1,500 mg a day.  When  on the DASH eating plan, aim to eat more fresh fruits and vegetables, whole grains, lean proteins, low-fat dairy, and heart-healthy fats.  Work with your health care provider or diet and nutrition specialist (dietitian) to adjust your eating plan to your individual calorie needs. This information is not intended to replace advice given to you by your health care provider. Make sure you discuss any questions you have with your health care provider. Document Released: 03/27/2011 Document Revised: 03/20/2017 Document Reviewed: 03/31/2016 Elsevier Patient Education  2020 [employer] Hydrochlorothiazide, HCTZ capsules or tablets What is this medicine? HYDROCHLOROTHIAZIDE (hye droe klor oh THYE a zide) is a diuretic. It increases the amount of urine passed, which causes the body to lose salt and water. This medicine is used to treat high blood pressure. It is also reduces the swelling and water retention caused by various medical conditions, such as heart, liver, or kidney disease. This medicine may be used for other purposes; ask your health care provider or pharmacist if you have questions. COMMON BRAND NAME(S): Esidrix, Ezide, HydroDIURIL, Microzide, Oretic, Zide What should I tell my health care provider before I take this medicine? They need to know if you have any of these conditions:  diabetes  gout  immune system problems, like lupus  kidney disease or kidney stones  liver disease  pancreatitis  small amount of urine or difficulty passing urine  an unusual or allergic reaction to hydrochlorothiazide, sulfa drugs, other medicines, foods, dyes, or preservatives  pregnant or trying to get pregnant  breast-feeding How should I use this medicine? Take this medicine by mouth with a glass of water. Follow the directions on the prescription label. Take your medicine at regular intervals. Remember that you will need to pass urine frequently after taking this medicine. Do not take your  doses at a time of day that will cause you problems. Do not stop taking your medicine unless your doctor tells you to. Talk to your pediatrician regarding the use of this medicine in children. Special care may be needed. Overdosage: If you think you have taken too much of this medicine contact a poison control center or emergency room at once. NOTE: This medicine is only for you. Do not share this medicine with others. What if I miss a dose? If you miss a dose, take it as soon as you can. If it is almost time for your next dose, take only that dose. Do not take double or extra doses. What may interact with this medicine?  cholestyramine  colestipol  digoxin  dofetilide  lithium  medicines for blood pressure  medicines for diabetes  medicines that relax muscles for surgery  other diuretics  steroid medicines like prednisone or cortisone This list may not describe all possible interactions. Give your health care provider a list of all the medicines, herbs, non-prescription drugs, or dietary supplements you use. Also tell them if you smoke, drink alcohol, or use illegal drugs. Some items may interact  with your medicine. What should I watch for while using this medicine? Visit your doctor or health care professional for regular checks on your progress. Check your blood pressure as directed. Ask your doctor or health care professional what your blood pressure should be and when you should contact him or her. You may need to be on a special diet while taking this medicine. Ask your doctor. Check with your doctor or health care professional if you get an attack of severe diarrhea, nausea and vomiting, or if you sweat a lot. The loss of too much body fluid can make it dangerous for you to take this medicine. You may get drowsy or dizzy. Do not drive, use machinery, or do anything that needs mental alertness until you know how this medicine affects you. Do not stand or sit up quickly,  especially if you are an older patient. This reduces the risk of dizzy or fainting spells. Alcohol may interfere with the effect of this medicine. Avoid alcoholic drinks. This medicine may increase blood sugar. Ask your healthcare provider if changes in diet or medicines are needed if you have diabetes. This medicine can make you more sensitive to the sun. Keep out of the sun. If you cannot avoid being in the sun, wear protective clothing and use sunscreen. Do not use sun lamps or tanning beds/booths. What side effects may I notice from receiving this medicine? Side effects that you should report to your doctor or health care professional as soon as possible:  allergic reactions such as skin rash or itching, hives, swelling of the lips, mouth, tongue, or throat  changes in vision  chest pain  eye pain  fast or irregular heartbeat  feeling faint or lightheaded, falls  gout attack  muscle pain or cramps  pain or difficulty when passing urine  pain, tingling, numbness in the hands or feet  redness, blistering, peeling or loosening of the skin, including inside the mouth   signs and symptoms of high blood sugar such as being more thirsty or hungry or having to urinate more than normal. You may also feel very tired or have blurry vision.  unusually weak Side effects that usually do not require medical attention (report to your doctor or health care professional if they continue or are bothersome):  change in sex drive or performance  dry mouth  headache  stomach upset This list may not describe all possible side effects. Call your doctor for medical advice about side effects. You may report side effects to FDA at 1-800-FDA-1088. Where should I keep my medicine? Keep out of the reach of children. Store at room temperature between 15 and 30 degrees C (59 and 86 degrees F). Do not freeze. Protect from light and moisture. Keep container closed tightly. Throw away any unused  medicine after the expiration date. NOTE: This sheet is a summary. It may not cover all possible information. If you have questions about this medicine, talk to your doctor, pharmacist, or health care provider.  2020 Elsevier/Gold Standard (2018-01-27 09:25:06)

## 2019-04-06 LAB — TSH: TSH: 0.848 u[IU]/mL (ref 0.450–4.500)

## 2019-04-06 LAB — VITAMIN D 25 HYDROXY (VIT D DEFICIENCY, FRACTURES): Vit D, 25-Hydroxy: 25.7 ng/mL — ABNORMAL LOW (ref 30.0–100.0)

## 2019-04-11 LAB — LIPID PANEL W/O CHOL/HDL RATIO
Cholesterol, Total: 182 mg/dL (ref 100–199)
HDL: 53 mg/dL (ref >39)
LDL Chol Calc (NIH): 113 mg/dL — ABNORMAL HIGH (ref 0–99)
Triglycerides: 86 mg/dL (ref 0–149)
VLDL Cholesterol Cal: 16 mg/dL (ref 5–40)

## 2019-04-11 LAB — COMPREHENSIVE METABOLIC PANEL
ALT: 11 IU/L (ref 0–32)
AST: 15 IU/L (ref 0–40)
Albumin/Globulin Ratio: 1.8 (ref 1.2–2.2)
Albumin: 3.8 g/dL (ref 3.8–4.8)
Alkaline Phosphatase: 77 IU/L (ref 39–117)
BUN/Creatinine Ratio: 12 (ref 9–23)
BUN: 9 mg/dL (ref 6–24)
Bilirubin Total: 0.4 mg/dL (ref 0.0–1.2)
CO2: 19 mmol/L — ABNORMAL LOW (ref 20–29)
Calcium: 8.9 mg/dL (ref 8.7–10.2)
Chloride: 104 mmol/L (ref 96–106)
Creatinine, Ser: 0.76 mg/dL (ref 0.57–1.00)
GFR calc Af Amer: 108 mL/min/{1.73_m2} (ref >59)
GFR calc non Af Amer: 94 mL/min/{1.73_m2} (ref >59)
Globulin, Total: 2.1 g/dL (ref 1.5–4.5)
Glucose: 93 mg/dL (ref 65–99)
Potassium: 4.3 mmol/L (ref 3.5–5.2)
Sodium: 137 mmol/L (ref 134–144)
Total Protein: 5.9 g/dL — ABNORMAL LOW (ref 6.0–8.5)

## 2019-04-11 LAB — SPECIMEN STATUS REPORT

## 2019-04-12 NOTE — Progress Notes (Signed)
-  CMP- kidney and liver function normal, electrolytes, normal.  -Cholesterol- LDL mild elevation is 113. -Thyroid within normal range.  -Vitamin D level low, 25.7 advise  to take over the counter Vitamin D 3 at 2,000     international units daily by mouth.  -Advise a low fat, low cholesterol is discussed with the patient.  -CBC was done at [facility]-03/29/2019 and ok

## 2019-05-10 ENCOUNTER — Encounter: Payer: Self-pay | Admitting: Adult Health

## 2019-05-10 ENCOUNTER — Other Ambulatory Visit: Payer: Self-pay

## 2019-05-10 ENCOUNTER — Other Ambulatory Visit (HOSPITAL_COMMUNITY)
Admission: RE | Admit: 2019-05-10 | Discharge: 2019-05-10 | Disposition: A | Discharge status: Home / Self Care | Payer: PRIVATE HEALTH INSURANCE | Source: Ambulatory Visit | Attending: Adult Health | Admitting: Adult Health

## 2019-05-10 ENCOUNTER — Ambulatory Visit: Payer: PRIVATE HEALTH INSURANCE | Admitting: Adult Health

## 2019-05-10 VITALS — BP 130/100 | HR 71 | Temp 97.3°F | Resp 16 | Wt 139.8 lb

## 2019-05-10 DIAGNOSIS — Z124 Encounter for screening for malignant neoplasm of cervix: Secondary | ICD-10-CM | POA: Diagnosis not present

## 2019-05-10 DIAGNOSIS — I10 Essential (primary) hypertension: Secondary | ICD-10-CM

## 2019-05-10 DIAGNOSIS — Z1239 Encounter for other screening for malignant neoplasm of breast: Secondary | ICD-10-CM | POA: Diagnosis not present

## 2019-05-10 DIAGNOSIS — R7989 Other specified abnormal findings of blood chemistry: Secondary | ICD-10-CM

## 2019-05-10 DIAGNOSIS — E7800 Pure hypercholesterolemia, unspecified: Secondary | ICD-10-CM

## 2019-05-10 DIAGNOSIS — F419 Anxiety disorder, unspecified: Secondary | ICD-10-CM | POA: Diagnosis not present

## 2019-05-10 DIAGNOSIS — Z0000 Encounter for general adult medical examination without abnormal findings: Secondary | ICD-10-CM

## 2019-05-10 LAB — POCT URINALYSIS DIPSTICK
Bilirubin, UA: NEGATIVE
Blood, UA: NEGATIVE
Glucose, UA: NEGATIVE
Ketones, UA: NEGATIVE
Leukocytes, UA: NEGATIVE
Nitrite, UA: NEGATIVE
Protein, UA: NEGATIVE
Spec Grav, UA: <=1.005 — AB (ref 1.010–1.025)
Urobilinogen, UA: 0.2 E.U./dL
pH, UA: 6 (ref 5.0–8.0)

## 2019-05-10 MED ORDER — HYDROCHLOROTHIAZIDE 25 MG PO TABS: 25.0000 mg | ORAL_TABLET | Freq: Every day | ORAL | 0 refills | Status: DC

## 2019-05-10 MED ORDER — DULOXETINE HCL 20 MG PO CPEP: 20.0000 mg | ORAL_CAPSULE | Freq: Every day | ORAL | 0 refills | Status: DC

## 2019-05-10 NOTE — Patient Instructions (Addendum)
Call to schedule Mammogram I ordered [facility] at [facility] [phone]  Kidney function and liver function check at rheumatology in 3 months.  Vitamin  D level and lipid panel in 6 months.  Take Vitamin D 3 at 4,000 IU international units daily by mouth.   [place], [place] [address]  Generalized Anxiety Disorder, Adult Generalized anxiety disorder (GAD) is a mental health disorder. People with this condition constantly worry about everyday events. Unlike normal anxiety, worry related to GAD is not triggered by a specific event. These worries also do not fade or get better with time. GAD interferes with life functions, including relationships, work, and school. GAD can vary from mild to severe. People with severe GAD can have intense waves of anxiety with physical symptoms (panic attacks). What are the causes? The exact cause of GAD is not known. What increases the risk? This condition is more likely to develop in:  Women.  People who have a family history of anxiety disorders.  People who are very shy.  People who experience very stressful life events, such as the death of a loved one.  People who have a very stressful family environment. What are the signs or symptoms? People with GAD often worry excessively about many things in their lives, such as their health and family. They may also be overly concerned about:  Doing well at work.  Being on time.  Natural disasters.  Friendships. Physical symptoms of GAD include:  Fatigue.  Muscle tension or having muscle twitches.  Trembling or feeling shaky.  Being easily startled.  Feeling like your heart is pounding or racing.  Feeling out of breath or like you cannot take a deep breath.  Having trouble falling asleep or staying asleep.  Sweating.  Nausea, diarrhea, or irritable bowel syndrome (IBS).  Headaches.  Trouble concentrating or remembering  facts.  Restlessness.  Irritability. How is this diagnosed? Your health care provider can diagnose GAD based on your symptoms and medical history. You will also have a physical exam. The health care provider will ask specific questions about your symptoms, including how severe they are, when they started, and if they come and go. Your health care provider may ask you about your use of alcohol or drugs, including prescription medicines. Your health care provider may refer you to a mental health specialist for further evaluation. Your health care provider will do a thorough examination and may perform additional tests to rule out other possible causes of your symptoms. To be diagnosed with GAD, a person must have anxiety that:  Is out of his or her control.  Affects several different aspects of his or her life, such as work and relationships.  Causes distress that makes him or her unable to take part in normal activities.  Includes at least three physical symptoms of GAD, such as restlessness, fatigue, trouble concentrating, irritability, muscle tension, or sleep problems. Before your health care provider can confirm a diagnosis of GAD, these symptoms must be present more days than they are not, and they must last for six months or longer. How is this treated? The following therapies are usually used to treat GAD:  Medicine. Antidepressant medicine is usually prescribed for long-term daily control. Antianxiety medicines may be added in severe cases, especially when panic attacks occur.  Talk therapy (psychotherapy). Certain types of talk therapy can be helpful in treating GAD by providing support, education, and guidance. Options include: ? Cognitive behavioral therapy (CBT). People learn  coping skills and techniques to ease their anxiety. They learn to identify unrealistic or negative thoughts and behaviors and to replace them with positive ones. ? Acceptance and commitment therapy (ACT).  This treatment teaches people how to be mindful as a way to cope with unwanted thoughts and feelings. ? Biofeedback. This process trains you to manage your body's response (physiological response) through breathing techniques and relaxation methods. You will work with a therapist while machines are used to monitor your physical symptoms.  Stress management techniques. These include yoga, meditation, and exercise. A mental health specialist can help determine which treatment is best for you. Some people see improvement with one type of therapy. However, other people require a combination of therapies. Follow these instructions at home:  Take over-the-counter and prescription medicines only as told by your health care provider.  Try to maintain a normal routine.  Try to anticipate stressful situations and allow extra time to manage them.  Practice any stress management or self-calming techniques as taught by your health care provider.  Do not punish yourself for setbacks or for not making progress.  Try to recognize your accomplishments, even if they are small.  Keep all follow-up visits as told by your health care provider. This is important. Contact a health care provider if:  Your symptoms do not get better.  Your symptoms get worse.  You have signs of depression, such as: ? A persistently sad, cranky, or irritable mood. ? Loss of enjoyment in activities that used to bring you joy. ? Change in weight or eating. ? Changes in sleeping habits. ? Avoiding friends or family members. ? Loss of energy for normal tasks. ? Feelings of guilt or worthlessness. Get help right away if:  You have serious thoughts about hurting yourself or others. If you ever feel like you may hurt yourself or others, or have thoughts about taking your own life, get help right away. You can go to your nearest emergency department or call:  Your local emergency services (911 in the [place]  A suicide crisis  helpline, such as the National Suicide Prevention Lifeline at 9375536773. This is open 24 hours a day. Summary  Generalized anxiety disorder (GAD) is a mental health disorder that involves worry that is not triggered by a specific event.  People with GAD often worry excessively about many things in their lives, such as their health and family.  GAD may cause physical symptoms such as restlessness, trouble concentrating, sleep problems, frequent sweating, nausea, diarrhea, headaches, and trembling or muscle twitching.  A mental health specialist can help determine which treatment is best for you. Some people see improvement with one type of therapy. However, other people require a combination of therapies. This information is not intended to replace advice given to you by your health care provider. Make sure you discuss any questions you have with your health care provider. Document Revised: 03/20/2017 Document Reviewed: 02/26/2016 Elsevier Patient Education  2020 [employer]. Hypertension, Adult Hypertension is another name for high blood pressure. High blood pressure forces your heart to work harder to pump blood. This can cause problems over time. There are two numbers in a blood pressure reading. There is a top number (systolic) over a bottom number (diastolic). It is best to have a blood pressure that is below 120/80. Healthy choices can help lower your blood pressure, or you may need medicine to help lower it. What are the causes? The cause of this condition is not known. Some conditions may  be related to high blood pressure. What increases the risk?  Smoking.  Having type 2 diabetes mellitus, high cholesterol, or both.  Not getting enough exercise or physical activity.  Being overweight.  Having too much fat, sugar, calories, or salt (sodium) in your diet.  Drinking too much alcohol.  Having long-term (chronic) kidney disease.  Having a family history of high blood  pressure.  Age. Risk increases with age.  Race. You may be at higher risk if you are African American.  Gender. Men are at higher risk than women before age 68. After age 11, women are at higher risk than men.  Having obstructive sleep apnea.  Stress. What are the signs or symptoms?  High blood pressure may not cause symptoms. Very high blood pressure (hypertensive crisis) may cause: ? Headache. ? Feelings of worry or nervousness (anxiety). ? Shortness of breath. ? Nosebleed. ? A feeling of being sick to your stomach (nausea). ? Throwing up (vomiting). ? Changes in how you see. ? Very bad chest pain. ? Seizures. How is this treated?  This condition is treated by making healthy lifestyle changes, such as: ? Eating healthy foods. ? Exercising more. ? Drinking less alcohol.  Your health care provider may prescribe medicine if lifestyle changes are not enough to get your blood pressure under control, and if: ? Your top number is above 130. ? Your bottom number is above 80.  Your personal target blood pressure may vary. Follow these instructions at home: Eating and drinking   If told, follow the DASH eating plan. To follow this plan: ? Fill one half of your plate at each meal with fruits and vegetables. ? Fill one fourth of your plate at each meal with whole grains. Whole grains include whole-wheat pasta, brown rice, and whole-grain bread. ? Eat or drink low-fat dairy products, such as skim milk or low-fat yogurt. ? Fill one fourth of your plate at each meal with low-fat (lean) proteins. Low-fat proteins include fish, chicken without skin, eggs, beans, and tofu. ? Avoid fatty meat, cured and processed meat, or chicken with skin. ? Avoid pre-made or processed food.  Eat less than 1,500 mg of salt each day.  Do not drink alcohol if: ? Your doctor tells you not to drink. ? You are pregnant, may be pregnant, or are planning to become pregnant.  If you drink  alcohol: ? Limit how much you use to:  0-1 drink a day for women.  0-2 drinks a day for men. ? Be aware of how much alcohol is in your drink. In the [place] one drink equals one 12 oz bottle of beer (355 mL), one 5 oz glass of wine (148 mL), or one 1 oz glass of hard liquor (44 mL). Lifestyle   Work with your doctor to stay at a healthy weight or to lose weight. Ask your doctor what the best weight is for you.  Get at least 30 minutes of exercise most days of the week. This may include walking, swimming, or biking.  Get at least 30 minutes of exercise that strengthens your muscles (resistance exercise) at least 3 days a week. This may include lifting weights or doing Pilates.  Do not use any products that contain nicotine or tobacco, such as cigarettes, e-cigarettes, and chewing tobacco. If you need help quitting, ask your doctor.  Check your blood pressure at home as told by your doctor.  Keep all follow-up visits as told by your doctor. This is important. Medicines  Take over-the-counter and prescription medicines only as told by your doctor. Follow directions carefully.  Do not skip doses of blood pressure medicine. The medicine does not work as well if you skip doses. Skipping doses also puts you at risk for problems.  Ask your doctor about side effects or reactions to medicines that you should watch for. Contact a doctor if you:  Think you are having a reaction to the medicine you are taking.  Have headaches that keep coming back (recurring).  Feel dizzy.  Have swelling in your ankles.  Have trouble with your vision. Get help right away if you:  Get a very bad headache.  Start to feel mixed up (confused).  Feel weak or numb.  Feel faint.  Have very bad pain in your: ? Chest. ? Belly (abdomen).  Throw up more than once.  Have trouble breathing. Summary  Hypertension is another name for high blood pressure.  High blood pressure forces your heart to work  harder to pump blood.  For most people, a normal blood pressure is less than 120/80.  Making healthy choices can help lower blood pressure. If your blood pressure does not get lower with healthy choices, you may need to take medicine. This information is not intended to replace advice given to you by your health care provider. Make sure you discuss any questions you have with your health care provider. Document Revised: 12/16/2017 Document Reviewed: 12/16/2017 Elsevier Patient Education  2020 Elsevier Inc. Duloxetine delayed-release capsules What is this medicine? DULOXETINE (doo LOX e teen) is used to treat depression, anxiety, and different types of chronic pain. This medicine may be used for other purposes; ask your health care provider or pharmacist if you have questions. COMMON BRAND NAME(S): Cymbalta, Murrell Converse, [place]ren[place] What should I tell my health care provider before I take this medicine? They need to know if you have any of these conditions:  bipolar disorder  glaucoma  high blood pressure  kidney disease  liver disease  seizures  suicidal thoughts, plans or attempt; a previous suicide attempt by you or a family member  take medicines that treat or prevent blood clots  taken medicines called MAOIs like Carbex, Eldepryl, Marplan, Nardil, and Parnate within 14 days  trouble passing urine  an unusual reaction to duloxetine, other medicines, foods, dyes, or preservatives  pregnant or trying to get pregnant  breast-feeding How should I use this medicine? Take this medicine by mouth with a glass of water. Follow the directions on the prescription label. Do not crush, cut or chew some capsules of this medicine. Some capsules may be opened and sprinkled on applesauce. Check with your doctor or pharmacist if you are not sure. You can take this medicine with or without food. Take your medicine at regular intervals. Do not take your medicine more often than directed. Do not  stop taking this medicine suddenly except upon the advice of your doctor. Stopping this medicine too quickly may cause serious side effects or your condition may worsen. A special MedGuide will be given to you by the pharmacist with each prescription and refill. Be sure to read this information carefully each time. Talk to your pediatrician regarding the use of this medicine in children. While this drug may be prescribed for children as young as 84 years of age for selected conditions, precautions do apply. Overdosage: If you think you have taken too much of this medicine contact a poison control center or emergency room at once. NOTE: This medicine is  only for you. Do not share this medicine with others. What if I miss a dose? If you miss a dose, take it as soon as you can. If it is almost time for your next dose, take only that dose. Do not take double or extra doses. What may interact with this medicine? Do not take this medicine with any of the following medications:  desvenlafaxine  levomilnacipran  linezolid  MAOIs like Carbex, Eldepryl, Marplan, Nardil, and Parnate  methylene blue (injected into a vein)  milnacipran  thioridazine  venlafaxine This medicine may also interact with the following medications:  alcohol  amphetamines  aspirin and aspirin-like medicines  certain antibiotics like ciprofloxacin and enoxacin  certain medicines for blood pressure, heart disease, irregular heart beat  certain medicines for depression, anxiety, or psychotic disturbances  certain medicines for migraine headache like almotriptan, eletriptan, frovatriptan, naratriptan, rizatriptan, sumatriptan, zolmitriptan  certain medicines that treat or prevent blood clots like warfarin, enoxaparin, and dalteparin  cimetidine  fentanyl  lithium  NSAIDS, medicines for pain and inflammation, like ibuprofen or naproxen  phentermine  procarbazine  rasagiline  sibutramine  St. John's  wort  theophylline  tramadol  tryptophan This list may not describe all possible interactions. Give your health care provider a list of all the medicines, herbs, non-prescription drugs, or dietary supplements you use. Also tell them if you smoke, drink alcohol, or use illegal drugs. Some items may interact with your medicine. What should I watch for while using this medicine? Tell your doctor if your symptoms do not get better or if they get worse. Visit your doctor or healthcare provider for regular checks on your progress. Because it may take several weeks to see the full effects of this medicine, it is important to continue your treatment as prescribed by your doctor. This medicine may cause serious skin reactions. They can happen weeks to months after starting the medicine. Contact your healthcare provider right away if you notice fevers or flu-like symptoms with a rash. The rash may be red or purple and then turn into blisters or peeling of the skin. Or, you might notice a red rash with swelling of the face, lips, or lymph nodes in your neck or under your arms. Patients and their families should watch out for new or worsening thoughts of suicide or depression. Also watch out for sudden changes in feelings such as feeling anxious, agitated, panicky, irritable, hostile, aggressive, impulsive, severely restless, overly excited and hyperactive, or not being able to sleep. If this happens, especially at the beginning of treatment or after a change in dose, call your healthcare provider. You may get drowsy or dizzy. Do not drive, use machinery, or do anything that needs mental alertness until you know how this medicine affects you. Do not stand or sit up quickly, especially if you are an older patient. This reduces the risk of dizzy or fainting spells. Alcohol may interfere with the effect of this medicine. Avoid alcoholic drinks. This medicine can cause an increase in blood pressure. This medicine can  also cause a sudden drop in your blood pressure, which may make you feel faint and increase the chance of a fall. These effects are most common when you first start the medicine or when the dose is increased, or during use of other medicines that can cause a sudden drop in blood pressure. Check with your doctor for instructions on monitoring your blood pressure while taking this medicine. Your mouth may get dry. Chewing sugarless gum or  sucking hard candy, and drinking plenty of water, may help. Contact your doctor if the problem does not go away or is severe. What side effects may I notice from receiving this medicine? Side effects that you should report to your doctor or health care professional as soon as possible:  allergic reactions like skin rash, itching or hives, swelling of the face, lips, or tongue  anxious  breathing problems  confusion  changes in vision  chest pain  confusion  elevated mood, decreased need for sleep, racing thoughts, impulsive behavior  eye pain  fast, irregular heartbeat  feeling faint or lightheaded, falls  feeling agitated, angry, or irritable  hallucination, loss of contact with reality  high blood pressure  loss of balance or coordination  palpitations  redness, blistering, peeling or loosening of the skin, including inside the mouth  restlessness, pacing, inability to keep still  seizures  stiff muscles  suicidal thoughts or other mood changes  trouble passing urine or change in the amount of urine  trouble sleeping  unusual bleeding or bruising  unusually weak or tired  vomiting  yellowing of the eyes or skin Side effects that usually do not require medical attention (report to your doctor or health care professional if they continue or are bothersome):  change in sex drive or performance  change in appetite or weight  constipation  dizziness  dry mouth  headache  increased sweating  nausea  tired This  list may not describe all possible side effects. Call your doctor for medical advice about side effects. You may report side effects to FDA at 1-800-FDA-1088. Where should I keep my medicine? Keep out of the reach of children. Store at room temperature between 15 and 30 degrees C (59 to 86 degrees F). Throw away any unused medicine after the expiration date. NOTE: This sheet is a summary. It may not cover all possible information. If you have questions about this medicine, talk to your doctor, pharmacist, or health care provider.  2020 Elsevier/Gold Standard (2018-07-08 13:47:50)

## 2019-05-10 NOTE — Progress Notes (Signed)
Patient: Isabella Burch Female    DOB: April 28, 1970   49 y.o.   MRN: [id number] Visit Date: 05/10/2019  Today's Provider: Jairo Ben, FNP   Chief Complaint  Patient presents with  . Hypertension   Subjective:     HPI   Hypertension, follow-up:  BP Readings from Last 3 Encounters:  05/10/19 (!) 130/100  04/05/19 140/90  11/21/15 (!) 153/98    She was last seen for hypertension 1 months ago.  BP at that visit was 140/90. Management changes since that visit include restarting patient on HCTZ 12.5mg  daily . She reports good compliance with treatment. She is not having side effects.  She is exercising. She is adherent to low salt diet.   Outside blood pressures are not being checked. She is experiencing near-syncope and palpitations.  Patient denies chest pain, chest pressure/discomfort, claudication, dyspnea, exertional chest pressure/discomfort, fatigue, irregular heart beat, lower extremity edema, orthopnea, palpitations, paroxysmal nocturnal dyspnea, syncope and tachypnea.   Cardiovascular risk factors include hypertension.  Use of agents associated with hypertension: none.   Denies any headache until today-  She took aleve and resolved.. She used hot shower.   Weight trend: stable Wt Readings from Last 3 Encounters:  05/10/19 139 lb 12.8 oz (63.4 kg)  04/05/19 140 lb (63.5 kg)  11/21/15 118 lb 4 oz (53.6 kg)    Current diet: well balanced  She is wanting to start mediation for anxiety. She denies any changes in her score from last visit.  She has anxiety,her father is dying. Denies any suicidal or homicidal ideation.  GAD 7 : Generalized Anxiety Score 04/05/2019  Nervous, Anxious, on Edge 2  Control/stop worrying 2  Worry too much - different things 2  Trouble relaxing 2  Restless 1  Easily annoyed or irritable 2  Afraid - awful might happen 2  Total GAD 7 Score 13   Depression screen [facility] 2/9 04/05/2019 02/25/2013  Decreased Interest 1 1    Down, Depressed, Hopeless 1 0  PHQ - 2 Score 2 1  Altered sleeping 0 -  Tired, decreased energy 1 -  Change in appetite 1 -  Feeling bad or failure about yourself  0 -  Trouble concentrating 0 -  Moving slowly or fidgety/restless 0 -  Suicidal thoughts 0 -  PHQ-9 Score 4 -  Difficult doing work/chores Not difficult at all -    ------------------------------------------------------------------------ Patient  denies any fever,chills, rash, chest pain, shortness of breath, nausea, vomiting, or diarrhea.    Allergies  Allergen Reactions  . Enbrel [Etanercept] Swelling  . Humira [Adalimumab] Other (See Comments)    kidney dysfunction     Current Outpatient Medications:  .  hydrochlorothiazide (HYDRODIURIL) 12.5 MG tablet, Take 1 tablet (12.5 mg total) by mouth daily., Disp: 30 tablet, Rfl: 0 .  leflunomide (ARAVA) 10 MG tablet, TAKE 1 TABLET BY MOUTH ONCE A DAY, Disp: , Rfl:  .  ORENCIA 125 MG/ML SOSY, , Disp: , Rfl:  .  tiZANidine (ZANAFLEX) 4 MG tablet, Take by mouth., Disp: , Rfl:   Review of Systems  Constitutional: Negative.   HENT: Negative.   Eyes: Negative.   Respiratory: Negative.   Cardiovascular: Negative.   Gastrointestinal: Negative.   Endocrine: Negative for polydipsia, polyphagia and polyuria.  Genitourinary: Negative.   Musculoskeletal: Positive for arthralgias and myalgias. Negative for back pain, gait problem, joint swelling, neck pain and neck stiffness.       Treated for RA  Skin: Negative.   Neurological: Negative.   Hematological: Negative.   Psychiatric/Behavioral: Negative for agitation, behavioral problems, confusion, decreased concentration, dysphoric mood, hallucinations, self-injury, sleep disturbance and suicidal ideas. The patient is nervous/anxious. The patient is not hyperactive.     Social History   Tobacco Use  . Smoking status: Current Every Day Smoker    Packs/day: 0.50    Years: 25.00    Pack years: 12.50    Types: Cigarettes   . Smokeless tobacco: Never Used  Substance Use Topics  . Alcohol use: No    Alcohol/week: 0.0 standard drinks      Objective:   BP (!) 130/100   Pulse 71   Temp (!) 97.3 F (36.3 C) (Oral)   Resp 16   Wt 139 lb 12.8 oz (63.4 kg)   LMP 04/13/2019 (Exact Date)   SpO2 99%   BMI 22.91 kg/m  Vitals:   05/10/19 1028  BP: (!) 130/100  Pulse: 71  Resp: 16  Temp: (!) 97.3 F (36.3 C)  TempSrc: Oral  SpO2: 99%  Weight: 139 lb 12.8 oz (63.4 kg)  Body mass index is 22.91 kg/m.   Physical Exam Vitals reviewed. Exam conducted with a chaperone present.  Constitutional:      General: She is not in acute distress.    Appearance: She is well-developed. She is not diaphoretic.     Interventions: She is not intubated. HENT:     Head: Normocephalic and atraumatic.     Right Ear: External ear normal.     Left Ear: External ear normal.     Nose: Nose normal.     Mouth/Throat:     Pharynx: No oropharyngeal exudate.  Eyes:     General: Lids are normal. No scleral icterus.       Right eye: No discharge.        Left eye: No discharge.     Conjunctiva/sclera: Conjunctivae normal.     Right eye: Right conjunctiva is not injected. No exudate or hemorrhage.    Left eye: Left conjunctiva is not injected. No exudate or hemorrhage.    Pupils: Pupils are equal, round, and reactive to light.  Neck:     Thyroid: No thyroid mass or thyromegaly.     Vascular: Normal carotid pulses. No carotid bruit, hepatojugular reflux or JVD.     Trachea: Trachea and phonation normal. No tracheal tenderness or tracheal deviation.     Meningeal: Brudzinski's sign and Kernig's sign absent.  Cardiovascular:     Rate and Rhythm: Normal rate and regular rhythm.     Pulses: Normal pulses.          Radial pulses are 2+ on the right side and 2+ on the left side.       Dorsalis pedis pulses are 2+ on the right side and 2+ on the left side.       Posterior tibial pulses are 2+ on the right side and 2+ on the left  side.     Heart sounds: Normal heart sounds, S1 normal and S2 normal. Heart sounds not distant. No murmur. No friction rub. No gallop.   Pulmonary:     Effort: Pulmonary effort is normal. No tachypnea, bradypnea, accessory muscle usage or respiratory distress. She is not intubated.     Breath sounds: Normal breath sounds. No stridor. No wheezing or rales.  Chest:     Chest wall: No tenderness.  Abdominal:     General: Bowel sounds are normal. There is  no distension or abdominal bruit.     Palpations: Abdomen is soft. There is no shifting dullness, fluid wave, hepatomegaly, splenomegaly, mass or pulsatile mass.     Tenderness: There is no abdominal tenderness. There is no guarding or rebound.     Hernia: No hernia is present. There is no hernia in the left inguinal area or right inguinal area.  Genitourinary:    General: Normal vulva.     Exam position: Supine.     Pubic Area: No rash or pubic lice.      Labia:        Right: No rash, tenderness, lesion or injury.        Left: No rash, tenderness, lesion or injury.      Urethra: No prolapse, urethral pain, urethral swelling or urethral lesion.     Vagina: Normal.     Cervix: Normal. No eversion.     Uterus: Normal.      Adnexa: Right adnexa normal and left adnexa normal.       Right: No mass, tenderness or fullness.         Left: No mass, tenderness or fullness.       Rectum: No external hemorrhoid.     Comments: Declined rectal exam, no issue, denies bleeding or pain.   Musculoskeletal:        General: No tenderness or deformity. Normal range of motion.     Cervical back: Full passive range of motion without pain, normal range of motion and neck supple. No edema, erythema or rigidity. No spinous process tenderness or muscular tenderness. Normal range of motion.  Lymphadenopathy:     Head:     Right side of head: No submental, submandibular, tonsillar, preauricular, posterior auricular or occipital adenopathy.     Left side of head:  No submental, submandibular, tonsillar, preauricular, posterior auricular or occipital adenopathy.     Cervical: No cervical adenopathy.     Right cervical: No superficial, deep or posterior cervical adenopathy.    Left cervical: No superficial, deep or posterior cervical adenopathy.     Upper Body:     Right upper body: No supraclavicular or pectoral adenopathy.     Left upper body: No supraclavicular or pectoral adenopathy.     Lower Body: No right inguinal adenopathy. No left inguinal adenopathy.  Skin:    General: Skin is warm and dry.     Coloration: Skin is not pale.     Findings: No abrasion, bruising, burn, ecchymosis, erythema, lesion, petechiae or rash.     Nails: There is no clubbing.  Neurological:     Mental Status: She is alert and oriented to person, place, and time.     GCS: GCS eye subscore is 4. GCS verbal subscore is 5. GCS motor subscore is 6.     Cranial Nerves: No cranial nerve deficit.     Sensory: No sensory deficit.     Motor: No tremor, atrophy, abnormal muscle tone or seizure activity.     Coordination: Coordination normal.     Gait: Gait normal.     Deep Tendon Reflexes: Reflexes are normal and symmetric. Reflexes normal. Babinski sign absent on the right side. Babinski sign absent on the left side.     Reflex Scores:      Tricep reflexes are 2+ on the right side and 2+ on the left side.      Bicep reflexes are 2+ on the right side and 2+ on the left side.  Brachioradialis reflexes are 2+ on the right side and 2+ on the left side.      Patellar reflexes are 2+ on the right side and 2+ on the left side.      Achilles reflexes are 2+ on the right side and 2+ on the left side. Psychiatric:        Speech: Speech normal.        Behavior: Behavior normal.        Thought Content: Thought content normal.        Judgment: Judgment normal.      Results for orders placed or performed in visit on 05/10/19  POCT urinalysis dipstick  Result Value Ref Range    Color, UA yellow    Clarity, UA clear    Glucose, UA Negative Negative   Bilirubin, UA negative    Ketones, UA negative    Spec Grav, UA <=1.005 (A) 1.010 - 1.025   Blood, UA negative    pH, UA 6.0 5.0 - 8.0   Protein, UA Negative Negative   Urobilinogen, UA 0.2 0.2 or 1.0 E.U./dL   Nitrite, UA negative    Leukocytes, UA Negative Negative   Appearance     Odor         Assessment & Plan     1. Encounter for routine adult health examination without abnormal findings Normal urine.  - POCT urinalysis dipstick - MM Digital Screening; Future  2. Screening for cervical cancer PAP Vaginal exam today completed.  - Cytology - PAP  Will call with results.  She has not had a Pap smear since over 23 years ago when her son was born.  Denies any abnormalities at that time.  She can  3. Encounter for screening for malignant neoplasm of breast, unspecified screening modality Screening mammogram ordered- no previous- does self breast exam.  She denies any changes.   4. Anxiety No change since last GAD 7 scoring. She desired to start Cymbalta at lowest available dose.  We will start Cymbalta at 20 mg daily.  Discussed side effects.  Interaction checker performed with her other rheumatoid arthritis medications and no significant interactions found.  She will also clear this with her rheumatologist.  She will follow-up in 1 month for repeat GAD-7, and she will call the office should anything change or worsen.  Her dad is currently dying, he is in need of a heart catheterization but also has a GI bleed and they are unable to performed procedure.  Is aware that she can call me should she need any assistance during that time if her dad were to pass away or her anxiety was to increase.  5. Benign essential HTN Pressure today is still elevated 130/100, she has regularly been taking the HCTZ 12.5 mg daily, however she did miss the last 2 days, recently took Aleve.  However with chart review I have looked  back and see that her diastolic pressure remains over 90 over the last few years.  I recommend increasing dose of HCTZ.  25 mg once daily by mouth.  Check blood pressure in 1 month, monitor blood pressure at home if able to.  Report any dizziness lightheadedness or blood pressures within the hypotensive or hypertensive parameters given.  Is in agreement with plan.  Always reduce back down to 12.5 if needed and she can break the tablets in half.  6.  Vitamin D serum level low-patient is advised to take vitamin D3 at 4000 international units daily by mouth.  We will repeat the labs and 6 months of vitamin D and her lipid panel.  She will also have CBC and CMP drawn regularly every 3 months with her rheumatologist can be viewed in care everywhere, provider will look for these results and she will not have to have those done here as not to duplicate labs.  Her next recheck is in March 2021.  This should be sufficient unless any unusual symptoms occur.   Meds ordered this encounter  Medications  . DULoxetine (CYMBALTA) 20 MG capsule    Sig: Take 1 capsule (20 mg total) by mouth daily.    Dispense:  90 capsule    Refill:  0  . hydrochlorothiazide (HYDRODIURIL) 25 MG tablet    Sig: Take 1 tablet (25 mg total) by mouth daily.    Dispense:  90 tablet    Refill:  0   Medications Discontinued During This Encounter  Medication Reason  . hydrochlorothiazide (HYDRODIURIL) 12.5 MG tablet Discontinued by provider  Discussed RED FLAGS and when to follow up.   Return in about 1 month (around 06/10/2019), or if symptoms worsen or fail to improve, for at any time for any worsening symptoms, Go to Emergency room/ urgent care if worse.  Advised patient call the office or your primary care doctor for an appointment if no improvement within 72 hours or if any symptoms change or worsen at any time  Advised ER or urgent Care if after hours or on weekend. Call 911 for emergency symptoms at any time.Patinet verbalized  understanding of all instructions given/reviewed and treatment plan and has no further questions or concerns at this time.      The entirety of the information documented in the History of Present Illness, Review of Systems and Physical Exam were personally obtained by me. Portions of this information were initially documented by the  Certified Medical Assistant whose name is documented in [place] and reviewed by me for thoroughness and accuracy.  I have personally performed the exam and reviewed the chart and it is accurate to the best of my knowledge.  Haematologist has been used and any errors in dictation or transcription are unintentional.  Kelby Aline. [place], [place]

## 2019-05-11 LAB — CYTOLOGY - PAP
Chlamydia: NEGATIVE
Comment: NEGATIVE
Comment: NORMAL
Diagnosis: NEGATIVE
Neisseria Gonorrhea: NEGATIVE

## 2019-05-12 NOTE — Progress Notes (Signed)
Released to MyChart: PAP smear normal without any abnormal lesions or malignant cells found. Also tests for gonorrhea and chlamydia in swab negative as well. Repeat PAP in 3 years recommended unless any abnormal vaginal bleeding/ discharge or pain follow up with office sooner.

## 2019-06-14 ENCOUNTER — Encounter: Payer: Self-pay | Admitting: Adult Health

## 2019-06-14 ENCOUNTER — Other Ambulatory Visit: Payer: Self-pay

## 2019-06-14 ENCOUNTER — Ambulatory Visit: Payer: PRIVATE HEALTH INSURANCE | Admitting: Adult Health

## 2019-06-14 VITALS — BP 126/84 | HR 90 | Temp 96.8°F | Resp 16 | Wt 135.6 lb

## 2019-06-14 DIAGNOSIS — I10 Essential (primary) hypertension: Secondary | ICD-10-CM

## 2019-06-14 DIAGNOSIS — R7989 Other specified abnormal findings of blood chemistry: Secondary | ICD-10-CM | POA: Diagnosis not present

## 2019-06-14 DIAGNOSIS — H6993 Unspecified Eustachian tube disorder, bilateral: Secondary | ICD-10-CM | POA: Insufficient documentation

## 2019-06-14 DIAGNOSIS — F17209 Nicotine dependence, unspecified, with unspecified nicotine-induced disorders: Secondary | ICD-10-CM | POA: Insufficient documentation

## 2019-06-14 DIAGNOSIS — M069 Rheumatoid arthritis, unspecified: Secondary | ICD-10-CM | POA: Diagnosis not present

## 2019-06-14 DIAGNOSIS — F419 Anxiety disorder, unspecified: Secondary | ICD-10-CM

## 2019-06-14 DIAGNOSIS — H60502 Unspecified acute noninfective otitis externa, left ear: Secondary | ICD-10-CM

## 2019-06-14 DIAGNOSIS — H6983 Other specified disorders of Eustachian tube, bilateral: Secondary | ICD-10-CM

## 2019-06-14 MED ORDER — DULOXETINE HCL 20 MG PO CPEP: 20.0000 mg | ORAL_CAPSULE | Freq: Every day | ORAL | 0 refills | Status: DC

## 2019-06-14 MED ORDER — HYDROCHLOROTHIAZIDE 25 MG PO TABS: 25.0000 mg | ORAL_TABLET | Freq: Every day | ORAL | 1 refills | Status: DC

## 2019-06-14 MED ORDER — NEOMYCIN-POLYMYXIN-HC 1 % OT SOLN: 3.0000 [drp] | Freq: Four times a day (QID) | OTIC | 0 refills | Status: DC

## 2019-06-14 MED ORDER — FLUTICASONE PROPIONATE 50 MCG/ACT NA SUSP: 2.0000 | Freq: Every day | NASAL | 1 refills | Status: DC

## 2019-06-14 NOTE — Patient Instructions (Addendum)
Eustachian Tube Dysfunction  Eustachian tube dysfunction refers to a condition in which a blockage develops in the narrow passage that connects the middle ear to the back of the nose (eustachian tube). The eustachian tube regulates air pressure in the middle ear by letting air move between the ear and nose. It also helps to drain fluid from the middle ear space. Eustachian tube dysfunction can affect one or both ears. When the eustachian tube does not function properly, air pressure, fluid, or both can build up in the middle ear. What are the causes? This condition occurs when the eustachian tube becomes blocked or cannot open normally. Common causes of this condition include:  Ear infections.  Colds and other infections that affect the nose, mouth, and throat (upper respiratory tract).  Allergies.  Irritation from cigarette smoke.  Irritation from stomach acid coming up into the esophagus (gastroesophageal reflux). The esophagus is the tube that carries food from the mouth to the stomach.  Sudden changes in air pressure, such as from descending in an airplane or scuba diving.  Abnormal growths in the nose or throat, such as: ? Growths that line the nose (nasal polyps). ? Abnormal growth of cells (tumors). ? Enlarged tissue at the back of the throat (adenoids). What increases the risk? You are more likely to develop this condition if:  You smoke.  You are overweight.  You are a child who has: ? Certain birth defects of the mouth, such as cleft palate. ? Large tonsils or adenoids. What are the signs or symptoms? Common symptoms of this condition include:  A feeling of fullness in the ear.  Ear pain.  Clicking or popping noises in the ear.  Ringing in the ear.  Hearing loss.  Loss of balance.  Dizziness. Symptoms may get worse when the air pressure around you changes, such as when you travel to an area of high elevation, fly on an airplane, or go scuba diving. How is  this diagnosed? This condition may be diagnosed based on:  Your symptoms.  A physical exam of your ears, nose, and throat.  Tests, such as those that measure: ? The movement of your eardrum (tympanogram). ? Your hearing (audiometry). How is this treated? Treatment depends on the cause and severity of your condition.  In mild cases, you may relieve your symptoms by moving air into your ears. This is called "popping the ears."  In more severe cases, or if you have symptoms of fluid in your ears, treatment may include: ? Medicines to relieve congestion (decongestants). ? Medicines that treat allergies (antihistamines). ? Nasal sprays or ear drops that contain medicines that reduce swelling (steroids). ? A procedure to drain the fluid in your eardrum (myringotomy). In this procedure, a small tube is placed in the eardrum to:  Drain the fluid.  Restore the air in the middle ear space. ? A procedure to insert a balloon device through the nose to inflate the opening of the eustachian tube (balloon dilation). Follow these instructions at home: Lifestyle  Do not do any of the following until your health care provider approves: ? Travel to high altitudes. ? Fly in airplanes. ? Work in a pressurized cabin or room. ? Scuba dive.  Do not use any products that contain nicotine or tobacco, such as cigarettes and e-cigarettes. If you need help quitting, ask your health care provider.  Keep your ears dry. Wear fitted earplugs during showering and bathing. Dry your ears completely after. General instructions  Take over-the-counter   and prescription medicines only as told by your health care provider.  Use techniques to help pop your ears as recommended by your health care provider. These may include: ? Chewing gum. ? Yawning. ? Frequent, forceful swallowing. ? Closing your mouth, holding your nose closed, and gently blowing as if you are trying to blow air out of your nose.  Keep all  follow-up visits as told by your health care provider. This is important. Contact a health care provider if:  Your symptoms do not go away after treatment.  Your symptoms come back after treatment.  You are unable to pop your ears.  You have: ? A fever. ? Pain in your ear. ? Pain in your head or neck. ? Fluid draining from your ear.  Your hearing suddenly changes.  You become very dizzy.  You lose your balance. Summary  Eustachian tube dysfunction refers to a condition in which a blockage develops in the eustachian tube.  It can be caused by ear infections, allergies, inhaled irritants, or abnormal growths in the nose or throat.  Symptoms include ear pain, hearing loss, or ringing in the ears.  Mild cases are treated with maneuvers to unblock the ears, such as yawning or ear popping.  Severe cases are treated with medicines. Surgery may also be done (rare). This information is not intended to replace advice given to you by your health care provider. Make sure you discuss any questions you have with your health care provider. Document Revised: 07/28/2017 Document Reviewed: 07/28/2017 Elsevier Patient Education  2020 [employer]    DASH Eating Plan DASH stands for "Dietary Approaches to Stop Hypertension." The DASH eating plan is a healthy eating plan that has been shown to reduce high blood pressure (hypertension). It may also reduce your risk for type 2 diabetes, heart disease, and stroke. The DASH eating plan may also help with weight loss. What are tips for following this plan?  General guidelines  Avoid eating more than 2,300 mg (milligrams) of salt (sodium) a day. If you have hypertension, you may need to reduce your sodium intake to 1,500 mg a day.  Limit alcohol intake to no more than 1 drink a day for nonpregnant women and 2 drinks a day for men. One drink equals 12 oz of beer, 5 oz of wine, or 1 oz of hard liquor.  Work with your health care provider to  maintain a healthy body weight or to lose weight. Ask what an ideal weight is for you.  Get at least 30 minutes of exercise that causes your heart to beat faster (aerobic exercise) most days of the week. Activities may include walking, swimming, or biking.  Work with your health care provider or diet and nutrition specialist (dietitian) to adjust your eating plan to your individual calorie needs. Reading food labels   Check food labels for the amount of sodium per serving. Choose foods with less than 5 percent of the Daily Value of sodium. Generally, foods with less than 300 mg of sodium per serving fit into this eating plan.  To find whole grains, look for the word "whole" as the first word in the ingredient list. Shopping  Buy products labeled as "low-sodium" or "no salt added."  Buy fresh foods. Avoid canned foods and premade or frozen meals. Cooking  Avoid adding salt when cooking. Use salt-free seasonings or herbs instead of table salt or sea salt. Check with your health care provider or pharmacist before using salt substitutes.  Do not  fry foods. Cook foods using healthy methods such as baking, boiling, grilling, and broiling instead.  Cook with heart-healthy oils, such as olive, canola, soybean, or sunflower oil. Meal planning  Eat a balanced diet that includes: ? 5 or more servings of fruits and vegetables each day. At each meal, try to fill half of your plate with fruits and vegetables. ? Up to 6-8 servings of whole grains each day. ? Less than 6 oz of lean meat, poultry, or fish each day. A 3-oz serving of meat is about the same size as a deck of cards. One egg equals 1 oz. ? 2 servings of low-fat dairy each day. ? A serving of nuts, seeds, or beans 5 times each week. ? Heart-healthy fats. Healthy fats called Omega-3 fatty acids are found in foods such as flaxseeds and coldwater fish, like sardines, salmon, and mackerel.  Limit how much you eat of the following: ? Canned  or prepackaged foods. ? Food that is high in trans fat, such as fried foods. ? Food that is high in saturated fat, such as fatty meat. ? Sweets, desserts, sugary drinks, and other foods with added sugar. ? Full-fat dairy products.  Do not salt foods before eating.  Try to eat at least 2 vegetarian meals each week.  Eat more home-cooked food and less restaurant, buffet, and fast food.  When eating at a restaurant, ask that your food be prepared with less salt or no salt, if possible. What foods are recommended? The items listed may not be a complete list. Talk with your dietitian about what dietary choices are best for you. Grains Whole-grain or whole-wheat bread. Whole-grain or whole-wheat pasta. Brown rice. [place]. Bulgur. Whole-grain and low-sodium cereals. Pita bread. Low-fat, low-sodium crackers. Whole-wheat flour tortillas. Vegetables Fresh or frozen vegetables (raw, steamed, roasted, or grilled). Low-sodium or reduced-sodium tomato and vegetable juice. Low-sodium or reduced-sodium tomato sauce and tomato paste. Low-sodium or reduced-sodium canned vegetables. Fruits All fresh, dried, or frozen fruit. Canned fruit in natural juice (without added sugar). Meat and other protein foods Skinless chicken or [place]. Ground chicken or [place]. Pork with fat trimmed off. Fish and seafood. Egg whites. Dried beans, peas, or lentils. Unsalted nuts, nut butters, and seeds. Unsalted canned beans. Lean cuts of beef with fat trimmed off. Low-sodium, lean deli meat. Dairy Low-fat (1%) or fat-free (skim) milk. Fat-free, low-fat, or reduced-fat cheeses. Nonfat, low-sodium ricotta or cottage cheese. Low-fat or nonfat yogurt. Low-fat, low-sodium cheese. Fats and oils Soft margarine without trans fats. Vegetable oil. Low-fat, reduced-fat, or light mayonnaise and salad dressings (reduced-sodium). Canola, safflower, olive, soybean, and sunflower oils. Avocado. Seasoning and other foods Herbs. Spices.  Seasoning mixes without salt. Unsalted popcorn and pretzels. Fat-free sweets. What foods are not recommended? The items listed may not be a complete list. Talk with your dietitian about what dietary choices are best for you. Grains Baked goods made with fat, such as croissants, muffins, or some breads. Dry pasta or rice meal packs. Vegetables Creamed or fried vegetables. Vegetables in a cheese sauce. Regular canned vegetables (not low-sodium or reduced-sodium). Regular canned tomato sauce and paste (not low-sodium or reduced-sodium). Regular tomato and vegetable juice (not low-sodium or reduced-sodium). Angie Fava. Olives. Fruits Canned fruit in a light or heavy syrup. Fried fruit. Fruit in cream or butter sauce. Meat and other protein foods Fatty cuts of meat. Ribs. Fried meat. Berniece Salines. Sausage. Bologna and other processed lunch meats. Salami. Fatback. Hotdogs. Bratwurst. Salted nuts and seeds. Canned beans with added salt.  Canned or smoked fish. Whole eggs or egg yolks. Chicken or [place] with skin. Dairy Whole or 2% milk, cream, and half-and-half. Whole or full-fat cream cheese. Whole-fat or sweetened yogurt. Full-fat cheese. Nondairy creamers. Whipped toppings. Processed cheese and cheese spreads. Fats and oils Butter. Stick margarine. Lard. Shortening. Ghee. Bacon fat. Tropical oils, such as coconut, palm kernel, or palm oil. Seasoning and other foods Salted popcorn and pretzels. Onion salt, garlic salt, seasoned salt, table salt, and sea salt. Worcestershire sauce. Tartar sauce. Barbecue sauce. Teriyaki sauce. Soy sauce, including reduced-sodium. Steak sauce. Canned and packaged gravies. Fish sauce. Oyster sauce. Cocktail sauce. Horseradish that you find on the shelf. Ketchup. Mustard. Meat flavorings and tenderizers. Bouillon cubes. Hot sauce and Tabasco sauce. Premade or packaged marinades. Premade or packaged taco seasonings. Relishes. Regular salad dressings. Where to find more  information:  National Heart, Lung, and [place]: https://wilson-eaton.com/  American Heart Association: www.heart.org Summary  The DASH eating plan is a healthy eating plan that has been shown to reduce high blood pressure (hypertension). It may also reduce your risk for type 2 diabetes, heart disease, and stroke.  With the DASH eating plan, you should limit salt (sodium) intake to 2,300 mg a day. If you have hypertension, you may need to reduce your sodium intake to 1,500 mg a day.  When on the DASH eating plan, aim to eat more fresh fruits and vegetables, whole grains, lean proteins, low-fat dairy, and heart-healthy fats.  Work with your health care provider or diet and nutrition specialist (dietitian) to adjust your eating plan to your individual calorie needs. This information is not intended to replace advice given to you by your health care provider. Make sure you discuss any questions you have with your health care provider. Document Revised: 03/20/2017 Document Reviewed: 03/31/2016 Elsevier Patient Education  2020 [employer]. Hypertension, Adult Hypertension is another name for high blood pressure. High blood pressure forces your heart to work harder to pump blood. This can cause problems over time. There are two numbers in a blood pressure reading. There is a top number (systolic) over a bottom number (diastolic). It is best to have a blood pressure that is below 120/80. Healthy choices can help lower your blood pressure, or you may need medicine to help lower it. What are the causes? The cause of this condition is not known. Some conditions may be related to high blood pressure. What increases the risk?  Smoking.  Having type 2 diabetes mellitus, high cholesterol, or both.  Not getting enough exercise or physical activity.  Being overweight.  Having too much fat, sugar, calories, or salt (sodium) in your diet.  Drinking too much alcohol.  Having long-term (chronic)  kidney disease.  Having a family history of high blood pressure.  Age. Risk increases with age.  Race. You may be at higher risk if you are African American.  Gender. Men are at higher risk than women before age 92. After age 82, women are at higher risk than men.  Having obstructive sleep apnea.  Stress. What are the signs or symptoms?  High blood pressure may not cause symptoms. Very high blood pressure (hypertensive crisis) may cause: ? Headache. ? Feelings of worry or nervousness (anxiety). ? Shortness of breath. ? Nosebleed. ? A feeling of being sick to your stomach (nausea). ? Throwing up (vomiting). ? Changes in how you see. ? Very bad chest pain. ? Seizures. How is this treated?  This condition is treated by making healthy lifestyle  changes, such as: ? Eating healthy foods. ? Exercising more. ? Drinking less alcohol.  Your health care provider may prescribe medicine if lifestyle changes are not enough to get your blood pressure under control, and if: ? Your top number is above 130. ? Your bottom number is above 80.  Your personal target blood pressure may vary. Follow these instructions at home: Eating and drinking   If told, follow the DASH eating plan. To follow this plan: ? Fill one half of your plate at each meal with fruits and vegetables. ? Fill one fourth of your plate at each meal with whole grains. Whole grains include whole-wheat pasta, brown rice, and whole-grain bread. ? Eat or drink low-fat dairy products, such as skim milk or low-fat yogurt. ? Fill one fourth of your plate at each meal with low-fat (lean) proteins. Low-fat proteins include fish, chicken without skin, eggs, beans, and tofu. ? Avoid fatty meat, cured and processed meat, or chicken with skin. ? Avoid pre-made or processed food.  Eat less than 1,500 mg of salt each day.  Do not drink alcohol if: ? Your doctor tells you not to drink. ? You are pregnant, may be pregnant, or are  planning to become pregnant.  If you drink alcohol: ? Limit how much you use to:  0-1 drink a day for women.  0-2 drinks a day for men. ? Be aware of how much alcohol is in your drink. In the [place] one drink equals one 12 oz bottle of beer (355 mL), one 5 oz glass of wine (148 mL), or one 1 oz glass of hard liquor (44 mL). Lifestyle   Work with your doctor to stay at a healthy weight or to lose weight. Ask your doctor what the best weight is for you.  Get at least 30 minutes of exercise most days of the week. This may include walking, swimming, or biking.  Get at least 30 minutes of exercise that strengthens your muscles (resistance exercise) at least 3 days a week. This may include lifting weights or doing Pilates.  Do not use any products that contain nicotine or tobacco, such as cigarettes, e-cigarettes, and chewing tobacco. If you need help quitting, ask your doctor.  Check your blood pressure at home as told by your doctor.  Keep all follow-up visits as told by your doctor. This is important. Medicines  Take over-the-counter and prescription medicines only as told by your doctor. Follow directions carefully.  Do not skip doses of blood pressure medicine. The medicine does not work as well if you skip doses. Skipping doses also puts you at risk for problems.  Ask your doctor about side effects or reactions to medicines that you should watch for. Contact a doctor if you:  Think you are having a reaction to the medicine you are taking.  Have headaches that keep coming back (recurring).  Feel dizzy.  Have swelling in your ankles.  Have trouble with your vision. Get help right away if you:  Get a very bad headache.  Start to feel mixed up (confused).  Feel weak or numb.  Feel faint.  Have very bad pain in your: ? Chest. ? Belly (abdomen).  Throw up more than once.  Have trouble breathing. Summary  Hypertension is another name for high blood  pressure.  High blood pressure forces your heart to work harder to pump blood.  For most people, a normal blood pressure is less than 120/80.  Making healthy choices can help  lower blood pressure. If your blood pressure does not get lower with healthy choices, you may need to take medicine. This information is not intended to replace advice given to you by your health care provider. Make sure you discuss any questions you have with your health care provider. Document Revised: 12/16/2017 Document Reviewed: 12/16/2017 Elsevier Patient Education  2020 Else[employer] Inc. Generalized Anxiety Disorder, Adult Generalized anxiety disorder (GAD) is a mental health disorder. People with this condition constantly worry about everyday events. Unlike normal anxiety, worry related to GAD is not triggered by a specific event. These worries also do not fade or get better with time. GAD interferes with life functions, including relationships, work, and school. GAD can vary from mild to severe. People with severe GAD can have intense waves of anxiety with physical symptoms (panic attacks). What are the causes? The exact cause of GAD is not known. What increases the risk? This condition is more likely to develop in:  Women.  People who have a family history of anxiety disorders.  People who are very shy.  People who experience very stressful life events, such as the death of a loved one.  People who have a very stressful family environment. What are the signs or symptoms? People with GAD often worry excessively about many things in their lives, such as their health and family. They may also be overly concerned about:  Doing well at work.  Being on time.  Natural disasters.  Friendships. Physical symptoms of GAD include:  Fatigue.  Muscle tension or having muscle twitches.  Trembling or feeling shaky.  Being easily startled.  Feeling like your heart is pounding or racing.  Feeling out of  breath or like you cannot take a deep breath.  Having trouble falling asleep or staying asleep.  Sweating.  Nausea, diarrhea, or irritable bowel syndrome (IBS).  Headaches.  Trouble concentrating or remembering facts.  Restlessness.  Irritability. How is this diagnosed? Your health care provider can diagnose GAD based on your symptoms and medical history. You will also have a physical exam. The health care provider will ask specific questions about your symptoms, including how severe they are, when they started, and if they come and go. Your health care provider may ask you about your use of alcohol or drugs, including prescription medicines. Your health care provider may refer you to a mental health specialist for further evaluation. Your health care provider will do a thorough examination and may perform additional tests to rule out other possible causes of your symptoms. To be diagnosed with GAD, a person must have anxiety that:  Is out of his or her control.  Affects several different aspects of his or her life, such as work and relationships.  Causes distress that makes him or her unable to take part in normal activities.  Includes at least three physical symptoms of GAD, such as restlessness, fatigue, trouble concentrating, irritability, muscle tension, or sleep problems. Before your health care provider can confirm a diagnosis of GAD, these symptoms must be present more days than they are not, and they must last for six months or longer. How is this treated? The following therapies are usually used to treat GAD:  Medicine. Antidepressant medicine is usually prescribed for long-term daily control. Antianxiety medicines may be added in severe cases, especially when panic attacks occur.  Talk therapy (psychotherapy). Certain types of talk therapy can be helpful in treating GAD by providing support, education, and guidance. Options include: ? Cognitive behavioral therapy (CBT).  People learn coping skills and techniques to ease their anxiety. They learn to identify unrealistic or negative thoughts and behaviors and to replace them with positive ones. ? Acceptance and commitment therapy (ACT). This treatment teaches people how to be mindful as a way to cope with unwanted thoughts and feelings. ? Biofeedback. This process trains you to manage your body's response (physiological response) through breathing techniques and relaxation methods. You will work with a therapist while machines are used to monitor your physical symptoms.  Stress management techniques. These include yoga, meditation, and exercise. A mental health specialist can help determine which treatment is best for you. Some people see improvement with one type of therapy. However, other people require a combination of therapies. Follow these instructions at home:  Take over-the-counter and prescription medicines only as told by your health care provider.  Try to maintain a normal routine.  Try to anticipate stressful situations and allow extra time to manage them.  Practice any stress management or self-calming techniques as taught by your health care provider.  Do not punish yourself for setbacks or for not making progress.  Try to recognize your accomplishments, even if they are small.  Keep all follow-up visits as told by your health care provider. This is important. Contact a health care provider if:  Your symptoms do not get better.  Your symptoms get worse.  You have signs of depression, such as: ? A persistently sad, cranky, or irritable mood. ? Loss of enjoyment in activities that used to bring you joy. ? Change in weight or eating. ? Changes in sleeping habits. ? Avoiding friends or family members. ? Loss of energy for normal tasks. ? Feelings of guilt or worthlessness. Get help right away if:  You have serious thoughts about hurting yourself or others. If you ever feel like you may  hurt yourself or others, or have thoughts about taking your own life, get help right away. You can go to your nearest emergency department or call:  Your local emergency services (911 in the [place]  A suicide crisis helpline, such as the National Suicide Prevention Lifeline at 340-150-2732. This is open 24 hours a day. Summary  Generalized anxiety disorder (GAD) is a mental health disorder that involves worry that is not triggered by a specific event.  People with GAD often worry excessively about many things in their lives, such as their health and family.  GAD may cause physical symptoms such as restlessness, trouble concentrating, sleep problems, frequent sweating, nausea, diarrhea, headaches, and trembling or muscle twitching.  A mental health specialist can help determine which treatment is best for you. Some people see improvement with one type of therapy. However, other people require a combination of therapies. This information is not intended to replace advice given to you by your health care provider. Make sure you discuss any questions you have with your health care provider. Document Revised: 03/20/2017 Document Reviewed: 02/26/2016 Elsevier Patient Education  2020 Else[employer]. Duloxetine delayed-release capsules What is this medicine? DULOXETINE (doo LOX e teen) is used to treat depression, anxiety, and different types of chronic pain. This medicine may be used for other purposes; ask your health care provider or pharmacist if you have questions. COMMON BRAND NAME(S): Cymbalta, Murrell Converse, [place]ren[place] What should I tell my health care provider before I take this medicine? They need to know if you have any of these conditions:  bipolar disorder  glaucoma  high blood pressure  kidney disease  liver disease  seizures  suicidal thoughts, plans or attempt; a previous suicide attempt by you or a family member  take medicines that treat or prevent blood clots  taken  medicines called MAOIs like Carbex, Eldepryl, Marplan, Nardil, and Parnate within 14 days  trouble passing urine  an unusual reaction to duloxetine, other medicines, foods, dyes, or preservatives  pregnant or trying to get pregnant  breast-feeding How should I use this medicine? Take this medicine by mouth with a glass of water. Follow the directions on the prescription label. Do not crush, cut or chew some capsules of this medicine. Some capsules may be opened and sprinkled on applesauce. Check with your doctor or pharmacist if you are not sure. You can take this medicine with or without food. Take your medicine at regular intervals. Do not take your medicine more often than directed. Do not stop taking this medicine suddenly except upon the advice of your doctor. Stopping this medicine too quickly may cause serious side effects or your condition may worsen. A special MedGuide will be given to you by the pharmacist with each prescription and refill. Be sure to read this information carefully each time. Talk to your pediatrician regarding the use of this medicine in children. While this drug may be prescribed for children as young as 18 years of age for selected conditions, precautions do apply. Overdosage: If you think you have taken too much of this medicine contact a poison control center or emergency room at once. NOTE: This medicine is only for you. Do not share this medicine with others. What if I miss a dose? If you miss a dose, take it as soon as you can. If it is almost time for your next dose, take only that dose. Do not take double or extra doses. What may interact with this medicine? Do not take this medicine with any of the following medications:  desvenlafaxine  levomilnacipran  linezolid  MAOIs like Carbex, Eldepryl, Marplan, Nardil, and Parnate  methylene blue (injected into a vein)  milnacipran  thioridazine  venlafaxine This medicine may also interact with the  following medications:  alcohol  amphetamines  aspirin and aspirin-like medicines  certain antibiotics like ciprofloxacin and enoxacin  certain medicines for blood pressure, heart disease, irregular heart beat  certain medicines for depression, anxiety, or psychotic disturbances  certain medicines for migraine headache like almotriptan, eletriptan, frovatriptan, naratriptan, rizatriptan, sumatriptan, zolmitriptan  certain medicines that treat or prevent blood clots like warfarin, enoxaparin, and dalteparin  cimetidine  fentanyl  lithium  NSAIDS, medicines for pain and inflammation, like ibuprofen or naproxen  phentermine  procarbazine  rasagiline  sibutramine  St. John's wort  theophylline  tramadol  tryptophan This list may not describe all possible interactions. Give your health care provider a list of all the medicines, herbs, non-prescription drugs, or dietary supplements you use. Also tell them if you smoke, drink alcohol, or use illegal drugs. Some items may interact with your medicine. What should I watch for while using this medicine? Tell your doctor if your symptoms do not get better or if they get worse. Visit your doctor or healthcare provider for regular checks on your progress. Because it may take several weeks to see the full effects of this medicine, it is important to continue your treatment as prescribed by your doctor. This medicine may cause serious skin reactions. They can happen weeks to months after starting the medicine. Contact your healthcare provider right away if you notice fevers or flu-like symptoms with a rash. The  rash may be red or purple and then turn into blisters or peeling of the skin. Or, you might notice a red rash with swelling of the face, lips, or lymph nodes in your neck or under your arms. Patients and their families should watch out for new or worsening thoughts of suicide or depression. Also watch out for sudden changes in  feelings such as feeling anxious, agitated, panicky, irritable, hostile, aggressive, impulsive, severely restless, overly excited and hyperactive, or not being able to sleep. If this happens, especially at the beginning of treatment or after a change in dose, call your healthcare provider. You may get drowsy or dizzy. Do not drive, use machinery, or do anything that needs mental alertness until you know how this medicine affects you. Do not stand or sit up quickly, especially if you are an older patient. This reduces the risk of dizzy or fainting spells. Alcohol may interfere with the effect of this medicine. Avoid alcoholic drinks. This medicine can cause an increase in blood pressure. This medicine can also cause a sudden drop in your blood pressure, which may make you feel faint and increase the chance of a fall. These effects are most common when you first start the medicine or when the dose is increased, or during use of other medicines that can cause a sudden drop in blood pressure. Check with your doctor for instructions on monitoring your blood pressure while taking this medicine. Your mouth may get dry. Chewing sugarless gum or sucking hard candy, and drinking plenty of water, may help. Contact your doctor if the problem does not go away or is severe. What side effects may I notice from receiving this medicine? Side effects that you should report to your doctor or health care professional as soon as possible:  allergic reactions like skin rash, itching or hives, swelling of the face, lips, or tongue  anxious  breathing problems  confusion  changes in vision  chest pain  confusion  elevated mood, decreased need for sleep, racing thoughts, impulsive behavior  eye pain  fast, irregular heartbeat  feeling faint or lightheaded, falls  feeling agitated, angry, or irritable  hallucination, loss of contact with reality  high blood pressure  loss of balance or  coordination  palpitations  redness, blistering, peeling or loosening of the skin, including inside the mouth  restlessness, pacing, inability to keep still  seizures  stiff muscles  suicidal thoughts or other mood changes  trouble passing urine or change in the amount of urine  trouble sleeping  unusual bleeding or bruising  unusually weak or tired  vomiting  yellowing of the eyes or skin Side effects that usually do not require medical attention (report to your doctor or health care professional if they continue or are bothersome):  change in sex drive or performance  change in appetite or weight  constipation  dizziness  dry mouth  headache  increased sweating  nausea  tired This list may not describe all possible side effects. Call your doctor for medical advice about side effects. You may report side effects to FDA at 1-800-FDA-1088. Where should I keep my medicine? Keep out of the reach of children. Store at room temperature between 15 and 30 degrees C (59 to 86 degrees F). Throw away any unused medicine after the expiration date. NOTE: This sheet is a summary. It may not cover all possible information. If you have questions about this medicine, talk to your doctor, pharmacist, or health care provider.  2020  Elsevier/Gold Standard (2018-07-08 13:47:50)

## 2019-06-14 NOTE — Progress Notes (Signed)
Patient: Isabella Burch Female    DOB: 23-Jul-1970   49 y.o.   MRN: [id number] Visit Date: 06/14/2019  Today's Provider: Jairo Ben, FNP   Chief Complaint  Patient presents with  . Anxiety  . Hypertension   Subjective:     Anxiety Presents for follow-up (patient was started on Cymbalta 20mg  on 05/10/19. Patient reports that she stopped medication 4 days after starting but restarted again 5 days ago) visit. Symptoms include nervous/anxious behavior. Patient reports no chest pain, compulsions, confusion, decreased concentration, depressed mood, dizziness, dry mouth, excessive worry, feeling of choking, hyperventilation, impotence, insomnia, irritability, malaise, muscle tension, nausea, obsessions, palpitations, panic, restlessness, shortness of breath or suicidal ideas. The quality of sleep is good. Nighttime awakenings: none.   Compliance with medications is 76-100%. Side effects of treatment include headaches.   She feels as if headaches have been related to increased stress and anxiety with her father and husband. Denies any pulsating or most severe headache in life. She does have right ear " popping " for the past month. Denies any pain or any respiratory  symptoms.   Her dad is still sick. She has stopped her Cymbalta after four days of starting not due side effects.  Her husband is an alcoholic. Denies any abuse.   Hypertension, follow-up: Blood pressure 126/84, pulse 90, temperature (!) 96.8 F (36 C), temperature source temporal temperature,  resp. rate 16, weight 135 lb 9.6 oz (61.5 kg), SpO2 98 %.  BP Readings from Last 3 Encounters:  05/10/19 (!) 130/100  04/05/19 140/90  11/21/15 (!) 153/98    She was last seen for hypertension 1 months ago.  BP at that visit was 130/100. Management changes since that visit include increasing HCTZ to 25mg . She reports excellent compliance with treatment. She is not having side effects.  She is not exercising. She is  adherent to low salt diet.   Outside blood pressures are not being checked. She is experiencing none.  Patient denies chest pain, chest pressure/discomfort, claudication, dyspnea, exertional chest pressure/discomfort, fatigue, irregular heart beat, lower extremity edema, near-syncope, orthopnea, palpitations, paroxysmal nocturnal dyspnea, syncope and tachypnea.   Cardiovascular risk factors include hypertension and smoking/ tobacco exposure.  Use of agents associated with hypertension: none.     Urinating without any problems. She is taking HCTZ 25mg  qd.  Weight trend: stable Wt Readings from Last 3 Encounters:  05/10/19 139 lb 12.8 oz (63.4 kg)  04/05/19 140 lb (63.5 kg)  11/21/15 118 lb 4 oz (53.6 kg)    Current diet: in general, a "healthy" diet    ------------------------------------------------------------------------   Allergies  Allergen Reactions  . Enbrel [Etanercept] Swelling  . Humira [Adalimumab] Other (See Comments)    kidney dysfunction     Current Outpatient Medications:  .  DULoxetine (CYMBALTA) 20 MG capsule, Take 1 capsule (20 mg total) by mouth daily., Disp: 90 capsule, Rfl: 0 .  hydrochlorothiazide (HYDRODIURIL) 25 MG tablet, Take 1 tablet (25 mg total) by mouth daily., Disp: 90 tablet, Rfl: 0 .  leflunomide (ARAVA) 10 MG tablet, TAKE 1 TABLET BY MOUTH ONCE A DAY, Disp: , Rfl:  .  ORENCIA CLICKJECT 125 MG/ML SOAJ, Inject 1 Syringe into the skin once a week., Disp: , Rfl:  .  tiZANidine (ZANAFLEX) 4 MG tablet, Take by mouth., Disp: , Rfl:  .  VITAMIN D PO, Take by mouth. 4000IU, Disp: , Rfl:   Review of Systems  Constitutional: Negative.  Negative for irritability.  HENT: Negative.  Negative for congestion, dental problem, drooling, ear discharge, ear pain, facial swelling, hearing loss, mouth sores, nosebleeds, postnasal drip, rhinorrhea, sinus pressure, sinus pain, sneezing, sore throat, tinnitus, trouble swallowing and voice change.        Right ear "  popping " for one month.   Eyes: Negative.   Respiratory: Negative.  Negative for shortness of breath.   Cardiovascular: Negative.  Negative for chest pain, palpitations and leg swelling.  Gastrointestinal: Negative.  Negative for nausea.  Endocrine: Negative.   Genitourinary: Negative.  Negative for impotence.  Musculoskeletal: Positive for arthralgias. Negative for back pain, gait problem, joint swelling, myalgias, neck pain and neck stiffness.       Chronic with rheumatoid arthritis- on therapy and keeps regular follow up's and labs with Rheumatologist.   Skin: Negative.   Neurological: Positive for headaches (occasional ). Negative for dizziness, tremors, seizures, syncope, facial asymmetry, speech difficulty, weakness, light-headedness and numbness.  Hematological: Does not bruise/bleed easily.  Psychiatric/Behavioral: Positive for sleep disturbance. Negative for agitation, behavioral problems, confusion, decreased concentration, dysphoric mood, hallucinations, self-injury and suicidal ideas. The patient is nervous/anxious. The patient does not have insomnia and is not hyperactive.     Social History   Tobacco Use  . Smoking status: Current Every Day Smoker    Packs/day: 0.50    Years: 25.00    Pack years: 12.50    Types: Cigarettes  . Smokeless tobacco: Never Used  Substance Use Topics  . Alcohol use: No    Alcohol/week: 0.0 standard drinks      Objective:  Blood pressure 126/84, pulse 90, temperature (!) 96.8 F (36 C), temperature source temporal , resp. rate 16, weight 135 lb 9.6 oz (61.5 kg), SpO2 98 %.   Physical Exam Vitals reviewed.  Constitutional:      General: She is not in acute distress.    Appearance: Normal appearance. She is not ill-appearing, toxic-appearing or diaphoretic.  HENT:     Head: Normocephalic and atraumatic.     Right Ear: There is no impacted cerumen.     Left Ear: Tympanic membrane, ear canal and external ear normal. There is no impacted  cerumen.  Eyes:     General: No scleral icterus.       Right eye: No discharge.        Left eye: No discharge.     Extraocular Movements: Extraocular movements intact.     Conjunctiva/sclera: Conjunctivae normal.     Pupils: Pupils are equal, round, and reactive to light.  Cardiovascular:     Pulses: Normal pulses.     Heart sounds: Normal heart sounds. No murmur. No friction rub. No gallop.   Pulmonary:     Effort: Pulmonary effort is normal. No respiratory distress.     Breath sounds: Normal breath sounds. No stridor. No wheezing, rhonchi or rales.  Chest:     Chest wall: No tenderness.  Abdominal:     Palpations: Abdomen is soft.  Skin:    General: Skin is warm and dry.     Capillary Refill: Capillary refill takes less than 2 seconds.  Neurological:     General: No focal deficit present.     Mental Status: She is oriented to person, place, and time.  Psychiatric:        Mood and Affect: Mood normal.        Behavior: Behavior normal.        Thought Content: Thought content normal.  Judgment: Judgment normal.      No results found for any visits on 06/14/19.     Assessment & Plan     Low serum vitamin D - Plan: VITAMIN D 25 Hydroxy (Vit-D Deficiency, Fractures)  Rheumatoid arthritis involving multiple sites, unspecified whether rheumatoid factor present (HCC)  Anxiety  Benign essential HTN  Acute otitis externa of left ear, unspecified type  Eustachian tube dysfunction, bilateral  Tobacco use disorder, continuous    Meds ordered this encounter  Medications  . hydrochlorothiazide (HYDRODIURIL) 25 MG tablet    Sig: Take 1 tablet (25 mg total) by mouth daily.    Dispense:  90 tablet    Refill:  1  . DULoxetine (CYMBALTA) 20 MG capsule    Sig: Take 1 capsule (20 mg total) by mouth daily.    Dispense:  90 capsule    Refill:  0  . fluticasone (FLONASE) 50 MCG/ACT nasal spray    Sig: Place 2 sprays into both nostrils daily.    Dispense:  16 g     Refill:  1  . NEOMYCIN-POLYMYXIN-HYDROCORTISONE (CORTISPORIN) 1 % SOLN OTIC solution    Sig: Place 3 drops into the right ear every 6 (six) hours.    Dispense:  10 mL    Refill:  0   An After Visit Summary was printed and given to the patient.     Reviewed prescribed medications and side effects, how to use and symptoms to report.   Return in about 4 months (around 10/12/2019), or if symptoms worsen or fail to improve, for at any time for any worsening symptoms, Call 911 for emergencies.  Continue diet/ exercise/ stress reduction.  Will repeat PHQ9 and GAD 7 at next visit and recheck blood pressure.  Continue medications as above. Reports if any high or low blood pressure readings at home or edema. Advised to report any unwanted side effects of Cymbalta and to gain full effectiveness will take time around 6 to 8 weeks. She is aware to report and new or changing behaviors or any suicidal or homicidal ideations or intents.   Advised patient call the office or your primary care doctor for an appointment if no improvement within 72 hours or if any symptoms change or worsen at any time  Advised ER or urgent Care if after hours or on weekend. Call 911 for emergency symptoms at any time.Patinet verbalized understanding of all instructions given/reviewed and treatment plan and has no further questions or concerns at this time.    She has a follow up 07/10/2019 with rheumatology MD and she will have repeat CBC and CMP in April this is her every three month schedule due to being on ARAVA and ORENCIA for rheumatoid arthritis. Added on Vitamin D 3 lab for April as well  as she was deficient and she is taking 4,000 international units daily by mouth over the counter Vitamin D 3. Last CBC and CMP 03/29/2019 within normal range.    The entirety of the information documented in the History of Present Illness, Review of Systems and Physical Exam were personally obtained by me. Portions of this information were  initially documented by the  Certified Medical Assistant whose name is documented in Epic and reviewed by me for thoroughness and accuracy.  I have personally performed the exam and reviewed the chart and it is accurate to the best of my knowledge.  Museum/gallery conservator has been used and any errors in dictation or transcription are unintentional.  Eula Fried.  [place], [facility]

## 2019-07-07 ENCOUNTER — Other Ambulatory Visit: Payer: Self-pay | Admitting: Adult Health

## 2019-12-05 ENCOUNTER — Telehealth: Payer: Self-pay | Admitting: Adult Health

## 2019-12-05 MED ORDER — DULOXETINE HCL 20 MG PO CPEP: 20.0000 mg | ORAL_CAPSULE | Freq: Every day | ORAL | 0 refills | Status: DC

## 2019-12-05 NOTE — Telephone Encounter (Signed)
Medication Refill - Medication: Cymbalta   Has the patient contacted their pharmacy? Yes.   (Agent: If no, request that the patient contact the pharmacy for the refill.) (Agent: If yes, when and what did the pharmacy advise?)  Preferred Pharmacy (with phone number or street name): [id number] - [place], [place] - [address]  Agent: Please be advised that RX refills may take up to 3 business days. We ask that you follow-up with your pharmacy.

## 2019-12-05 NOTE — Telephone Encounter (Signed)
Spoke with patient on the phone and advised follow up would be needed prior to refills. Patient scheduled appt for 11/20/19. KW

## 2019-12-21 ENCOUNTER — Ambulatory Visit: Payer: Self-pay | Admitting: Adult Health

## 2020-02-07 ENCOUNTER — Encounter: Payer: Self-pay | Admitting: Adult Health

## 2020-02-07 ENCOUNTER — Ambulatory Visit: Payer: PRIVATE HEALTH INSURANCE | Admitting: Adult Health

## 2020-02-07 ENCOUNTER — Other Ambulatory Visit: Payer: Self-pay

## 2020-02-07 VITALS — BP 132/93 | HR 82 | Temp 98.8°F | Resp 16 | Wt 137.8 lb

## 2020-02-07 DIAGNOSIS — E559 Vitamin D deficiency, unspecified: Secondary | ICD-10-CM

## 2020-02-07 DIAGNOSIS — I10 Essential (primary) hypertension: Secondary | ICD-10-CM | POA: Diagnosis not present

## 2020-02-07 DIAGNOSIS — Z1231 Encounter for screening mammogram for malignant neoplasm of breast: Secondary | ICD-10-CM | POA: Diagnosis not present

## 2020-02-07 DIAGNOSIS — F419 Anxiety disorder, unspecified: Secondary | ICD-10-CM | POA: Diagnosis not present

## 2020-02-07 DIAGNOSIS — M059 Rheumatoid arthritis with rheumatoid factor, unspecified: Secondary | ICD-10-CM

## 2020-02-07 MED ORDER — HYDROCHLOROTHIAZIDE 12.5 MG PO TABS: 12.5000 mg | ORAL_TABLET | Freq: Every day | ORAL | 0 refills | Status: DC

## 2020-02-07 MED ORDER — LOSARTAN POTASSIUM 25 MG PO TABS: 25.0000 mg | ORAL_TABLET | Freq: Every day | ORAL | 0 refills | Status: DC

## 2020-02-07 MED ORDER — DULOXETINE HCL 30 MG PO CPEP: 30.0000 mg | ORAL_CAPSULE | Freq: Every day | ORAL | 0 refills | Status: DC

## 2020-02-07 NOTE — Progress Notes (Signed)
Established patient visit   Patient: Isabella Burch   DOB: [birth year]   49 y.o. Female  MRN: [id number] Visit Date: 02/07/2020  Today's healthcare provider: Jairo Ben, FNP   Chief Complaint  Patient presents with  . Anxiety  . Hypertension   Subjective    HPI  Anxiety, Follow-up  She was last seen for anxiety 8 months ago. Changes made at last visit include none.  Cymbalta 20mg .  She reports excellent compliance with treatment. She reports good tolerance of treatment. She is not having side effects.   She feels her anxiety is moderate and Unchanged since last visit.  Husband was in the hospital.  DIed passed away recently.  Then she and he husband and family caught covid, denies any problems since.  Symptoms: No chest pain No difficulty concentrating  No dizziness No fatigue  Yes feelings of losing control Yes insomnia  Yes irritable Yes palpitations  Yes panic attacks Yes racing thoughts  No shortness of breath No sweating  No tremors/shakes    GAD-7 Results GAD-7 Generalized Anxiety Disorder Screening Tool 02/07/2020 04/05/2019  1. Feeling Nervous, Anxious, or on Edge 1 2  2. Not Being Able to Stop or Control Worrying 1 2  3. Worrying Too Much About Different Things 1 2  4. Trouble Relaxing 1 2  5. Being So Restless it's Hard To Sit Still 1 1  6. Becoming Easily Annoyed or Irritable 1 2  7. Feeling Afraid As If Something Awful Might Happen 1 2  Total GAD-7 Score 7 13  Difficulty At Work, Home, or Getting  Along With Others? Somewhat difficult -    PHQ-9 Scores PHQ9 SCORE ONLY 02/07/2020 04/05/2019 02/25/2013  PHQ-9 Total Score 10 4 1     --------------------------------------------------------------------------------------------------- Hypertension, follow-up  BP Readings from Last 3 Encounters:  02/07/20 (!) 132/93  06/14/19 126/84  05/10/19 (!) 130/100   Wt Readings from Last 3 Encounters:  02/07/20 137 lb 12.8 oz (62.5 kg)    06/14/19 135 lb 9.6 oz (61.5 kg)  05/10/19 139 lb 12.8 oz (63.4 kg)     She was last seen for hypertension 8 months ago. Taking HCTZ 25mg  daily.  BP at that visit was 126/84. Management since that visit includes none.  She reports excellent compliance with treatment. She is not having side effects.  She is following a Regular diet. She is exercising. She does smoke.  Use of agents associated with hypertension: none.  Denies any edema.  Outside blood pressures are systolic 130-150 diastolic 78-100. Symptoms: No chest pain No chest pressure  No palpitations No syncope  No dyspnea No orthopnea  No paroxysmal nocturnal dyspnea No lower extremity edema   Pertinent labs: Lab Results  Component Value Date   CHOL 182 04/05/2019   HDL 53 04/05/2019   LDLCALC 113 (H) 04/05/2019   TRIG 86 04/05/2019   CHOLHDL 3 11/21/2015   Lab Results  Component Value Date   NA 137 04/05/2019   K 4.3 04/05/2019   CREATININE 0.76 04/05/2019   GFRNONAA 94 04/05/2019   GFRAA 108 04/05/2019   GLUCOSE 93 04/05/2019     The 10-year ASCVD risk score Denman George DC Jr., et al., 2013) is: 3.9%    Patient  denies any fever, body aches,chills, rash, chest pain, shortness of breath, nausea, vomiting, or diarrhea.  Denies dizziness, lightheadedness, pre syncopal or syncopal episodes.   --------------------------------------------------------------------------------------------------- Patient Active Problem List   Diagnosis Date Noted  . Vitamin D deficiency 02/07/2020  .  Anxiety 06/14/2019  . Acute otitis externa of left ear 06/14/2019  . Eustachian tube dysfunction, bilateral 06/14/2019  . Low serum vitamin D 06/14/2019  . Tobacco use disorder, continuous 06/14/2019  . Rheumatoid arthritis (HCC) 11/22/2015  . Chest pain 11/22/2015  . Panic attacks 12/04/2014  . Benign essential HTN 10/08/2014  . Screening mammogram for breast cancer 02/25/2013   Past Medical History:  Diagnosis Date  . Anxiety    . Extensor tendon rupture, non-traumatic, hand and wrist    right  . Heart murmur   . History of kidney stones   . Hypertension   . Rheumatoid arthritis (HCC) dx 2006   BILATERAL HANDS AND RIGHT ANKLE   Allergies  Allergen Reactions  . Enbrel [Etanercept] Swelling  . Humira [Adalimumab] Other (See Comments)    kidney dysfunction       Medications: Outpatient Medications Prior to Visit  Medication Sig  . fluticasone (FLONASE) 50 MCG/ACT nasal spray SPRAY 2 SPRAYS INTO EACH NOSTRIL EVERY DAY  . leflunomide (ARAVA) 10 MG tablet TAKE 1 TABLET BY MOUTH ONCE A DAY  . NEOMYCIN-POLYMYXIN-HYDROCORTISONE (CORTISPORIN) 1 % SOLN OTIC solution Place 3 drops into the right ear every 6 (six) hours.  Dub Amis CLICKJECT 125 MG/ML SOAJ Inject 1 Syringe into the skin once a week.  Marland Kitchen VITAMIN D PO Take by mouth. 4000IU  . [DISCONTINUED] DULoxetine (CYMBALTA) 20 MG capsule Take 1 capsule (20 mg total) by mouth daily.  . [DISCONTINUED] hydrochlorothiazide (HYDRODIURIL) 25 MG tablet Take 1 tablet (25 mg total) by mouth daily.  Marland Kitchen tiZANidine (ZANAFLEX) 4 MG tablet Take by mouth. (Patient not taking: Reported on 02/07/2020)   No facility-administered medications prior to visit.    Review of Systems  Constitutional: Positive for fatigue. Negative for activity change, appetite change, chills, diaphoresis, fever and unexpected weight change.  HENT: Negative.   Respiratory: Negative.   Cardiovascular: Negative.   Gastrointestinal: Negative.   Genitourinary: Negative.   Musculoskeletal: Negative.        RA chronic   Psychiatric/Behavioral: Negative for agitation, behavioral problems, confusion, decreased concentration, dysphoric mood, hallucinations, self-injury and sleep disturbance. The patient is nervous/anxious. The patient is not hyperactive.     Last CBC Lab Results  Component Value Date   WBC 7.2 11/21/2015   HGB 14.7 11/21/2015   HCT 42.9 11/21/2015   MCV 95.2 11/21/2015   MCH 32.8  05/17/2015   RDW 13.6 11/21/2015   PLT 303.0 11/21/2015   Last metabolic panel Lab Results  Component Value Date   GLUCOSE 93 04/05/2019   NA 137 04/05/2019   K 4.3 04/05/2019   CL 104 04/05/2019   CO2 19 (L) 04/05/2019   BUN 9 04/05/2019   CREATININE 0.76 04/05/2019   GFRNONAA 94 04/05/2019   GFRAA 108 04/05/2019   CALCIUM 8.9 04/05/2019   PROT 5.9 (L) 04/05/2019   ALBUMIN 3.8 04/05/2019   LABGLOB 2.1 04/05/2019   AGRATIO 1.8 04/05/2019   BILITOT 0.4 04/05/2019   ALKPHOS 77 04/05/2019   AST 15 04/05/2019   ALT 11 04/05/2019   ANIONGAP 5 05/17/2015   Last lipids Lab Results  Component Value Date   CHOL 182 04/05/2019   HDL 53 04/05/2019   LDLCALC 113 (H) 04/05/2019   TRIG 86 04/05/2019   CHOLHDL 3 11/21/2015   Last hemoglobin A1c Lab Results  Component Value Date   HGBA1C 5.4 11/21/2015   Last thyroid functions Lab Results  Component Value Date   TSH 0.848 04/05/2019   Last  vitamin D Lab Results  Component Value Date   VD25OH 25.7 (L) 04/05/2019   Last vitamin B12 and Folate No results found for: VITAMINB12, FOLATE    Objective    BP (!) 132/93   Pulse 82   Temp 98.8 F (37.1 C) (Oral)   Resp 16   Wt 137 lb 12.8 oz (62.5 kg)   SpO2 99%   BMI 22.58 kg/m  BP Readings from Last 3 Encounters:  02/07/20 (!) 132/93  06/14/19 126/84  05/10/19 (!) 130/100      Physical Exam Constitutional:      General: She is not in acute distress.    Appearance: Normal appearance. She is not ill-appearing, toxic-appearing or diaphoretic.  HENT:     Head: Normocephalic and atraumatic.     Right Ear: External ear normal. There is no impacted cerumen.     Left Ear: External ear normal.     Nose: Nose normal. No congestion.     Mouth/Throat:     Mouth: Mucous membranes are moist.     Pharynx: Oropharynx is clear. No oropharyngeal exudate or posterior oropharyngeal erythema.  Eyes:     General: No scleral icterus.       Right eye: No discharge.        Left  eye: No discharge.     Conjunctiva/sclera: Conjunctivae normal.     Pupils: Pupils are equal, round, and reactive to light.  Neck:     Vascular: No carotid bruit.  Cardiovascular:     Rate and Rhythm: Normal rate and regular rhythm.     Pulses: Normal pulses.     Heart sounds: Normal heart sounds. No murmur heard.  No friction rub. No gallop.   Pulmonary:     Effort: Pulmonary effort is normal. No respiratory distress.     Breath sounds: Normal breath sounds. No stridor. No wheezing, rhonchi or rales.  Chest:     Chest wall: No tenderness.  Abdominal:     General: There is no distension.     Palpations: Abdomen is soft.     Tenderness: There is no abdominal tenderness.  Musculoskeletal:        General: Normal range of motion.     Cervical back: Normal range of motion and neck supple. No rigidity or tenderness.  Skin:    General: Skin is warm.     Capillary Refill: Capillary refill takes less than 2 seconds.     Findings: No erythema or rash.  Neurological:     General: No focal deficit present.     Mental Status: She is alert and oriented to person, place, and time.     Motor: No weakness.     Gait: Gait normal.  Psychiatric:        Mood and Affect: Mood normal.        Behavior: Behavior normal.        Thought Content: Thought content normal.        Judgment: Judgment normal.    No results found for any visits on 02/07/20.  Assessment & Plan     1. Anxiety Meds ordered this encounter  Medications  . DULoxetine (CYMBALTA) 30 MG capsule    Sig: Take 1 capsule (30 mg total) by mouth daily.    Dispense:  90 capsule    Refill:  0  . losartan (COZAAR) 25 MG tablet    Sig: Take 1 tablet (25 mg total) by mouth daily.    Dispense:  90 tablet  Refill:  0  . hydrochlorothiazide (HYDRODIURIL) 12.5 MG tablet    Sig: Take 1 tablet (12.5 mg total) by mouth daily.    Dispense:  90 tablet    Refill:  0    Will increase Cymbalta to 30mg  once daily.  Discussed known black  box warning for anti depression/ anxiety medication. Need to report any behavioral changes right, if any homicidal or suicidal thoughts or ideas seek medical attention right away. Call 911.    2. Vitamin D deficiency Recheck vitamin D Labs today.  Orders Placed This Encounter  Procedures  . MM Digital Screening  . TSH  . CBC with Differential/Platelet  . Comprehensive Metabolic Panel (CMET)  . VITAMIN D 25 Hydroxy (Vit-D Deficiency, Fractures)    3. Screening mammogram for breast cancer Ordered. She will call [facility] to schedule. No prior. Does self breast exams, denies any change, nipple discharge or skin changes.   4. Hypertension  Medications Discontinued During This Encounter  Medication Reason  . DULoxetine (CYMBALTA) 20 MG capsule Reorder  . hydrochlorothiazide (HYDRODIURIL) 25 MG tablet    Will change  Decrease  HCTZ 12.5mg  qd and start  Losartan 25 mg once by mouth daily. Recheck CMP at next visit.  Keep log bring readings to next visit. Call if not tolerating.  Diet/ lifestyle  reviewed.  Red Flags discussed. The patient was given clear instructions to go to ER or return to medical center if any red flags develop, symptoms do not improve, worsen or new problems develop. They verbalized understanding. PHQ and GAD at next visit and CMP.   Declined colonoscopy referral and Cologuard at this time, will readdress at later time.   Reviewed care everywhere labs with rheumatology.  Addressed acute and or chronic medical problems today requiring 30 minutes reviewing patients medical record,labs, counseling patient regarding patient's conditions, any medications, answering questions regarding health, and coordination of care as needed. After visit summary patient given copy and reviewed.  Return in 3 months (on 05/09/2020), or if symptoms worsen or fail to improve, for at any time for any worsening symptoms, Go to Emergency room/ urgent care if worse.    05/11/2020,  FNP  [facility] [phone] (phone) [phone] (fax)  [facility]

## 2020-02-07 NOTE — Patient Instructions (Addendum)
Call to schedule your screening mammogram. Your orders have been placed for your exam.  Let our office know if you have questions, concerns, or any difficulty scheduling.  If normal results then yearly screening mammograms are recommended unless you notice  Changes in your breast then you should schedule a follow up office visit. If abnormal results  Further imaging will be warranted and sooner follow up as determined by the radiologist at the [facility].   [facility] [address] [place], [place] [address]  Main: [phone]     Generalized Anxiety Disorder, Adult Generalized anxiety disorder (GAD) is a mental health disorder. People with this condition constantly worry about everyday events. Unlike normal anxiety, worry related to GAD is not triggered by a specific event. These worries also do not fade or get better with time. GAD interferes with life functions, including relationships, work, and school. GAD can vary from mild to severe. People with severe GAD can have intense waves of anxiety with physical symptoms (panic attacks). What are the causes? The exact cause of GAD is not known. What increases the risk? This condition is more likely to develop in:  Women.  People who have a family history of anxiety disorders.  People who are very shy.  People who experience very stressful life events, such as the death of a loved one.  People who have a very stressful family environment. What are the signs or symptoms? People with GAD often worry excessively about many things in their lives, such as their health and family. They may also be overly concerned about:  Doing well at work.  Being on time.  Natural disasters.  Friendships. Physical symptoms of GAD include:  Fatigue.  Muscle tension or having muscle twitches.  Trembling or feeling shaky.  Being easily startled.  Feeling like your heart is pounding or  racing.  Feeling out of breath or like you cannot take a deep breath.  Having trouble falling asleep or staying asleep.  Sweating.  Nausea, diarrhea, or irritable bowel syndrome (IBS).  Headaches.  Trouble concentrating or remembering facts.  Restlessness.  Irritability. How is this diagnosed? Your health care provider can diagnose GAD based on your symptoms and medical history. You will also have a physical exam. The health care provider will ask specific questions about your symptoms, including how severe they are, when they started, and if they come and go. Your health care provider may ask you about your use of alcohol or drugs, including prescription medicines. Your health care provider may refer you to a mental health specialist for further evaluation. Your health care provider will do a thorough examination and may perform additional tests to rule out other possible causes of your symptoms. To be diagnosed with GAD, a person must have anxiety that:  Is out of his or her control.  Affects several different aspects of his or her life, such as work and relationships.  Causes distress that makes him or her unable to take part in normal activities.  Includes at least three physical symptoms of GAD, such as restlessness, fatigue, trouble concentrating, irritability, muscle tension, or sleep problems. Before your health care provider can confirm a diagnosis of GAD, these symptoms must be present more days than they are not, and they must last for six months or longer. How is this treated? The following therapies are usually used to treat GAD:  Medicine. Antidepressant medicine is usually prescribed for long-term daily control. Antianxiety medicines may  be added in severe cases, especially when panic attacks occur.  Talk therapy (psychotherapy). Certain types of talk therapy can be helpful in treating GAD by providing support, education, and guidance. Options include: ? Cognitive  behavioral therapy (CBT). People learn coping skills and techniques to ease their anxiety. They learn to identify unrealistic or negative thoughts and behaviors and to replace them with positive ones. ? Acceptance and commitment therapy (ACT). This treatment teaches people how to be mindful as a way to cope with unwanted thoughts and feelings. ? Biofeedback. This process trains you to manage your body's response (physiological response) through breathing techniques and relaxation methods. You will work with a therapist while machines are used to monitor your physical symptoms.  Stress management techniques. These include yoga, meditation, and exercise. A mental health specialist can help determine which treatment is best for you. Some people see improvement with one type of therapy. However, other people require a combination of therapies. Follow these instructions at home:  Take over-the-counter and prescription medicines only as told by your health care provider.  Try to maintain a normal routine.  Try to anticipate stressful situations and allow extra time to manage them.  Practice any stress management or self-calming techniques as taught by your health care provider.  Do not punish yourself for setbacks or for not making progress.  Try to recognize your accomplishments, even if they are small.  Keep all follow-up visits as told by your health care provider. This is important. Contact a health care provider if:  Your symptoms do not get better.  Your symptoms get worse.  You have signs of depression, such as: ? A persistently sad, cranky, or irritable mood. ? Loss of enjoyment in activities that used to bring you joy. ? Change in weight or eating. ? Changes in sleeping habits. ? Avoiding friends or family members. ? Loss of energy for normal tasks. ? Feelings of guilt or worthlessness. Get help right away if:  You have serious thoughts about hurting yourself or others. If  you ever feel like you may hurt yourself or others, or have thoughts about taking your own life, get help right away. You can go to your nearest emergency department or call:  Your local emergency services (911 in the [place]  A suicide crisis helpline, such as the National Suicide Prevention Lifeline at 361 056 9125. This is open 24 hours a day. Summary  Generalized anxiety disorder (GAD) is a mental health disorder that involves worry that is not triggered by a specific event.  People with GAD often worry excessively about many things in their lives, such as their health and family.  GAD may cause physical symptoms such as restlessness, trouble concentrating, sleep problems, frequent sweating, nausea, diarrhea, headaches, and trembling or muscle twitching.  A mental health specialist can help determine which treatment is best for you. Some people see improvement with one type of therapy. However, other people require a combination of therapies. This information is not intended to replace advice given to you by your health care provider. Make sure you discuss any questions you have with your health care provider. Document Revised: 03/20/2017 Document Reviewed: 02/26/2016 Elsevier Patient Education  2020 Elsevier Inc. Duloxetine delayed-release capsules What is this medicine? DULOXETINE (doo LOX e teen) is used to treat depression, anxiety, and different types of chronic pain. This medicine may be used for other purposes; ask your health care provider or pharmacist if you have questions. COMMON BRAND NAME(S): Cymbalta, Drizalma, Irenka What should I  tell my health care provider before I take this medicine? They need to know if you have any of these conditions:  bipolar disorder  glaucoma  high blood pressure  kidney disease  liver disease  seizures  suicidal thoughts, plans or attempt; a previous suicide attempt by you or a family member  take medicines that treat or prevent  blood clots  taken medicines called MAOIs like Carbex, Eldepryl, Marplan, Nardil, and Parnate within 14 days  trouble passing urine  an unusual reaction to duloxetine, other medicines, foods, dyes, or preservatives  pregnant or trying to get pregnant  breast-feeding How should I use this medicine? Take this medicine by mouth with a glass of water. Follow the directions on the prescription label. Do not crush, cut or chew some capsules of this medicine. Some capsules may be opened and sprinkled on applesauce. Check with your doctor or pharmacist if you are not sure. You can take this medicine with or without food. Take your medicine at regular intervals. Do not take your medicine more often than directed. Do not stop taking this medicine suddenly except upon the advice of your doctor. Stopping this medicine too quickly may cause serious side effects or your condition may worsen. A special MedGuide will be given to you by the pharmacist with each prescription and refill. Be sure to read this information carefully each time. Talk to your pediatrician regarding the use of this medicine in children. While this drug may be prescribed for children as young as 18 years of age for selected conditions, precautions do apply. Overdosage: If you think you have taken too much of this medicine contact a poison control center or emergency room at once. NOTE: This medicine is only for you. Do not share this medicine with others. What if I miss a dose? If you miss a dose, take it as soon as you can. If it is almost time for your next dose, take only that dose. Do not take double or extra doses. What may interact with this medicine? Do not take this medicine with any of the following medications:  desvenlafaxine  levomilnacipran  linezolid  MAOIs like Carbex, Eldepryl, Marplan, Nardil, and Parnate  methylene blue (injected into a vein)  milnacipran  thioridazine  venlafaxine This medicine may also  interact with the following medications:  alcohol  amphetamines  aspirin and aspirin-like medicines  certain antibiotics like ciprofloxacin and enoxacin  certain medicines for blood pressure, heart disease, irregular heart beat  certain medicines for depression, anxiety, or psychotic disturbances  certain medicines for migraine headache like almotriptan, eletriptan, frovatriptan, naratriptan, rizatriptan, sumatriptan, zolmitriptan  certain medicines that treat or prevent blood clots like warfarin, enoxaparin, and dalteparin  cimetidine  fentanyl  lithium  NSAIDS, medicines for pain and inflammation, like ibuprofen or naproxen  phentermine  procarbazine  rasagiline  sibutramine  St. John's wort  theophylline  tramadol  tryptophan This list may not describe all possible interactions. Give your health care provider a list of all the medicines, herbs, non-prescription drugs, or dietary supplements you use. Also tell them if you smoke, drink alcohol, or use illegal drugs. Some items may interact with your medicine. What should I watch for while using this medicine? Tell your doctor if your symptoms do not get better or if they get worse. Visit your doctor or healthcare provider for regular checks on your progress. Because it may take several weeks to see the full effects of this medicine, it is important to continue your treatment  as prescribed by your doctor. This medicine may cause serious skin reactions. They can happen weeks to months after starting the medicine. Contact your healthcare provider right away if you notice fevers or flu-like symptoms with a rash. The rash may be red or purple and then turn into blisters or peeling of the skin. Or, you might notice a red rash with swelling of the face, lips, or lymph nodes in your neck or under your arms. Patients and their families should watch out for new or worsening thoughts of suicide or depression. Also watch out for  sudden changes in feelings such as feeling anxious, agitated, panicky, irritable, hostile, aggressive, impulsive, severely restless, overly excited and hyperactive, or not being able to sleep. If this happens, especially at the beginning of treatment or after a change in dose, call your healthcare provider. You may get drowsy or dizzy. Do not drive, use machinery, or do anything that needs mental alertness until you know how this medicine affects you. Do not stand or sit up quickly, especially if you are an older patient. This reduces the risk of dizzy or fainting spells. Alcohol may interfere with the effect of this medicine. Avoid alcoholic drinks. This medicine can cause an increase in blood pressure. This medicine can also cause a sudden drop in your blood pressure, which may make you feel faint and increase the chance of a fall. These effects are most common when you first start the medicine or when the dose is increased, or during use of other medicines that can cause a sudden drop in blood pressure. Check with your doctor for instructions on monitoring your blood pressure while taking this medicine. Your mouth may get dry. Chewing sugarless gum or sucking hard candy, and drinking plenty of water, may help. Contact your doctor if the problem does not go away or is severe. What side effects may I notice from receiving this medicine? Side effects that you should report to your doctor or health care professional as soon as possible:  allergic reactions like skin rash, itching or hives, swelling of the face, lips, or tongue  anxious  breathing problems  confusion  changes in vision  chest pain  confusion  elevated mood, decreased need for sleep, racing thoughts, impulsive behavior  eye pain  fast, irregular heartbeat  feeling faint or lightheaded, falls  feeling agitated, angry, or irritable  hallucination, loss of contact with reality  high blood pressure  loss of balance or  coordination  palpitations  redness, blistering, peeling or loosening of the skin, including inside the mouth  restlessness, pacing, inability to keep still  seizures  stiff muscles  suicidal thoughts or other mood changes  trouble passing urine or change in the amount of urine  trouble sleeping  unusual bleeding or bruising  unusually weak or tired  vomiting  yellowing of the eyes or skin Side effects that usually do not require medical attention (report to your doctor or health care professional if they continue or are bothersome):  change in sex drive or performance  change in appetite or weight  constipation  dizziness  dry mouth  headache  increased sweating  nausea  tired This list may not describe all possible side effects. Call your doctor for medical advice about side effects. You may report side effects to FDA at 1-800-FDA-1088. Where should I keep my medicine? Keep out of the reach of children. Store at room temperature between 15 and 30 degrees C (59 to 86 degrees F). Throw  away any unused medicine after the expiration date. NOTE: This sheet is a summary. It may not cover all possible information. If you have questions about this medicine, talk to your doctor, pharmacist, or health care provider.  2020 Elsevier/Gold Standard (2018-07-08 13:47:50) Hydrochlorothiazide, HCTZ Oral Capsules or Tablets What is this medicine? HYDROCHLOROTHIAZIDE (hye droe klor oh THYE a zide) is a diuretic. It helps you make more urine and to lose salt and excess water from your body. It treats swelling from heart, kidney, or liver disease. It also treats high blood pressure. This medicine may be used for other purposes; ask your health care provider or pharmacist if you have questions. COMMON BRAND NAME(S): Esidrix, Ezide, HydroDIURIL, Microzide, Oretic, Zide What should I tell my health care provider before I take this medicine? They need to know if you have any of  these conditions:  diabetes  gout  immune system problems, like lupus  kidney disease or kidney stones  liver disease  pancreatitis  small amount of urine or difficulty passing urine  an unusual or allergic reaction to hydrochlorothiazide, sulfa drugs, other medicines, foods, dyes, or preservatives  pregnant or trying to get pregnant  breast-feeding How should I use this medicine? Take this drug by mouth. Take it as directed on the prescription label at the same time every day. You can take it with or without food. If it upsets your stomach, take it with food. Keep taking it unless your health care provider tells you to stop. Talk to your health care provider about the use of this drug in children. While it may be prescribed for children as young as newborns for selected conditions, precautions do apply. Overdosage: If you think you have taken too much of this medicine contact a poison control center or emergency room at once. NOTE: This medicine is only for you. Do not share this medicine with others. What if I miss a dose? If you miss a dose, take it as soon as you can. If it is almost time for your next dose, take only that dose. Do not take double or extra doses. What may interact with this medicine?  cholestyramine  colestipol  digoxin  dofetilide  lithium  medicines for blood pressure  medicines for diabetes  medicines that relax muscles for surgery  other diuretics  steroid medicines like prednisone or cortisone This list may not describe all possible interactions. Give your health care provider a list of all the medicines, herbs, non-prescription drugs, or dietary supplements you use. Also tell them if you smoke, drink alcohol, or use illegal drugs. Some items may interact with your medicine. What should I watch for while using this medicine? Visit your doctor or health care professional for regular checks on your progress. Check your blood pressure as  directed. Ask your doctor or health care professional what your blood pressure should be and when you should contact him or her. Talk to your health care professional about your risk of skin cancer. You may be more at risk for skin cancer if you take this medicine. This medicine can make you more sensitive to the sun. Keep out of the sun. If you cannot avoid being in the sun, wear protective clothing and use sunscreen. Do not use sun lamps or tanning beds/booths. You may need to be on a special diet while taking this medicine. Ask your doctor. Check with your doctor or health care professional if you get an attack of severe diarrhea, nausea and vomiting, or if  you sweat a lot. The loss of too much body fluid can make it dangerous for you to take this medicine. You may get drowsy or dizzy. Do not drive, use machinery, or do anything that needs mental alertness until you know how this medicine affects you. Do not stand or sit up quickly, especially if you are an older patient. This reduces the risk of dizzy or fainting spells. Alcohol may interfere with the effect of this medicine. Avoid alcoholic drinks. This medicine may increase blood sugar. Ask your healthcare provider if changes in diet or medicines are needed if you have diabetes. What side effects may I notice from receiving this medicine? Side effects that you should report to your doctor or health care professional as soon as possible:  allergic reactions such as skin rash or itching, hives, swelling of the lips, mouth, tongue, or throat  changes in vision  chest pain  eye pain  fast or irregular heartbeat  feeling faint or lightheaded, falls  gout attack  muscle pain or cramps  pain or difficulty when passing urine  pain, tingling, numbness in the hands or feet  redness, blistering, peeling or loosening of the skin, including inside the mouth   signs and symptoms of high blood sugar such as being more thirsty or hungry or  having to urinate more than normal. You may also feel very tired or have blurry vision.  unusually weak Side effects that usually do not require medical attention (report to your doctor or health care professional if they continue or are bothersome):  change in sex drive or performance  dry mouth  headache  stomach upset This list may not describe all possible side effects. Call your doctor for medical advice about side effects. You may report side effects to FDA at 1-800-FDA-1088. Where should I keep my medicine? Keep out of the reach of children and pets. Store at room temperature between 20 and 25 degrees C (68 and 77 degrees F). Protect from light and moisture. Keep the container tightly closed. Do not freeze. Throw away any unused drug after the expiration date. NOTE: This sheet is a summary. It may not cover all possible information. If you have questions about this medicine, talk to your doctor, pharmacist, or health care provider.  2020 Elsevier/Gold Standard (2018-12-09 16:52:59) Losartan Tablets What is this medicine? LOSARTAN (loe SAR tan) is an angiotensin II receptor blocker, also known as an ARB. It treats high blood pressure. It can slow kidney damage in some patients. It may also be used to lower the risk of stroke. This medicine may be used for other purposes; ask your health care provider or pharmacist if you have questions. COMMON BRAND NAME(S): Cozaar What should I tell my health care provider before I take this medicine? They need to know if you have any of these conditions:  heart failure  kidney or liver disease  an unusual or allergic reaction to losartan, other medicines, foods, dyes, or preservatives  pregnant or trying to get pregnant  breast-feeding How should I use this medicine? Take this drug by mouth. Take it as directed on the prescription label at the same time every day. You can take it with or without food. If it upsets your stomach, take it  with food. Keep taking it unless your health care provider tells you to stop. Talk to your health care provider about the use of this drug in children. While it may be prescribed for children as young as 6 for  selected conditions, precautions do apply. Overdosage: If you think you have taken too much of this medicine contact a poison control center or emergency room at once. NOTE: This medicine is only for you. Do not share this medicine with others. What if I miss a dose? If you miss a dose, take it as soon as you can. If it is almost time for your next dose, take only that dose. Do not take double or extra doses. What may interact with this medicine?  blood pressure medicines  diuretics, especially triamterene, spironolactone, or amiloride  fluconazole  NSAIDs, medicines for pain and inflammation, like ibuprofen or naproxen  potassium salts or potassium supplements  rifampin This list may not describe all possible interactions. Give your health care provider a list of all the medicines, herbs, non-prescription drugs, or dietary supplements you use. Also tell them if you smoke, drink alcohol, or use illegal drugs. Some items may interact with your medicine. What should I watch for while using this medicine? Visit your doctor or health care professional for regular checks on your progress. Check your blood pressure as directed. Ask your doctor or health care professional what your blood pressure should be and when you should contact him or her. Call your doctor or health care professional if you notice an irregular or fast heart beat. Women should inform their doctor if they wish to become pregnant or think they might be pregnant. There is a potential for serious side effects to an unborn child, particularly in the second or third trimester. Talk to your health care professional or pharmacist for more information. You may get drowsy or dizzy. Do not drive, use machinery, or do anything that  needs mental alertness until you know how this drug affects you. Do not stand or sit up quickly, especially if you are an older patient. This reduces the risk of dizzy or fainting spells. Alcohol can make you more drowsy and dizzy. Avoid alcoholic drinks. Avoid salt substitutes unless you are told otherwise by your doctor or health care professional. Do not treat yourself for coughs, colds, or pain while you are taking this medicine without asking your doctor or health care professional for advice. Some ingredients may increase your blood pressure. What side effects may I notice from receiving this medicine? Side effects that you should report to your doctor or health care professional as soon as possible:  confusion, dizziness, light headedness or fainting spells  decreased amount of urine passed  difficulty breathing or swallowing, hoarseness, or tightening of the throat  fast or irregular heart beat, palpitations, or chest pain  skin rash, itching  swelling of your face, lips, tongue, hands, or feet Side effects that usually do not require medical attention (report to your doctor or health care professional if they continue or are bothersome):  cough  decreased sexual function or desire  headache  nasal congestion or stuffiness  nausea or stomach pain  sore or cramping muscles This list may not describe all possible side effects. Call your doctor for medical advice about side effects. You may report side effects to FDA at 1-800-FDA-1088. Where should I keep my medicine? Keep out of the reach of children and pets. Store at room temperature between 15 and 30 degrees C (59 and 86 degrees F). Protect from light. Keep the container tightly closed. Throw away any unused drug after the expiration date. NOTE: This sheet is a summary. It may not cover all possible information. If you have questions about this  medicine, talk to your doctor, pharmacist, or health care provider.  2020  Elsevier/Gold Standard (2018-11-10 12:12:28)   Hypertension, Adult High blood pressure (hypertension) is when the force of blood pumping through the arteries is too strong. The arteries are the blood vessels that carry blood from the heart throughout the body. Hypertension forces the heart to work harder to pump blood and may cause arteries to become narrow or stiff. Untreated or uncontrolled hypertension can cause a heart attack, heart failure, a stroke, kidney disease, and other problems. A blood pressure reading consists of a higher number over a lower number. Ideally, your blood pressure should be below 120/80. The first ("top") number is called the systolic pressure. It is a measure of the pressure in your arteries as your heart beats. The second ("bottom") number is called the diastolic pressure. It is a measure of the pressure in your arteries as the heart relaxes. What are the causes? The exact cause of this condition is not known. There are some conditions that result in or are related to high blood pressure. What increases the risk? Some risk factors for high blood pressure are under your control. The following factors may make you more likely to develop this condition:  Smoking.  Having type 2 diabetes mellitus, high cholesterol, or both.  Not getting enough exercise or physical activity.  Being overweight.  Having too much fat, sugar, calories, or salt (sodium) in your diet.  Drinking too much alcohol. Some risk factors for high blood pressure may be difficult or impossible to change. Some of these factors include:  Having chronic kidney disease.  Having a family history of high blood pressure.  Age. Risk increases with age.  Race. You may be at higher risk if you are African American.  Gender. Men are at higher risk than women before age 56. After age 54, women are at higher risk than men.  Having obstructive sleep apnea.  Stress. What are the signs or  symptoms? High blood pressure may not cause symptoms. Very high blood pressure (hypertensive crisis) may cause:  Headache.  Anxiety.  Shortness of breath.  Nosebleed.  Nausea and vomiting.  Vision changes.  Severe chest pain.  Seizures. How is this diagnosed? This condition is diagnosed by measuring your blood pressure while you are seated, with your arm resting on a flat surface, your legs uncrossed, and your feet flat on the floor. The cuff of the blood pressure monitor will be placed directly against the skin of your upper arm at the level of your heart. It should be measured at least twice using the same arm. Certain conditions can cause a difference in blood pressure between your right and left arms. Certain factors can cause blood pressure readings to be lower or higher than normal for a short period of time:  When your blood pressure is higher when you are in a health care provider's office than when you are at home, this is called white coat hypertension. Most people with this condition do not need medicines.  When your blood pressure is higher at home than when you are in a health care provider's office, this is called masked hypertension. Most people with this condition may need medicines to control blood pressure. If you have a high blood pressure reading during one visit or you have normal blood pressure with other risk factors, you may be asked to:  Return on a different day to have your blood pressure checked again.  Monitor your blood pressure  at home for 1 week or longer. If you are diagnosed with hypertension, you may have other blood or imaging tests to help your health care provider understand your overall risk for other conditions. How is this treated? This condition is treated by making healthy lifestyle changes, such as eating healthy foods, exercising more, and reducing your alcohol intake. Your health care provider may prescribe medicine if lifestyle changes  are not enough to get your blood pressure under control, and if:  Your systolic blood pressure is above 130.  Your diastolic blood pressure is above 80. Your personal target blood pressure may vary depending on your medical conditions, your age, and other factors. Follow these instructions at home: Eating and drinking   Eat a diet that is high in fiber and potassium, and low in sodium, added sugar, and fat. An example eating plan is called the DASH (Dietary Approaches to Stop Hypertension) diet. To eat this way: ? Eat plenty of fresh fruits and vegetables. Try to fill one half of your plate at each meal with fruits and vegetables. ? Eat whole grains, such as whole-wheat pasta, brown rice, or whole-grain bread. Fill about one fourth of your plate with whole grains. ? Eat or drink low-fat dairy products, such as skim milk or low-fat yogurt. ? Avoid fatty cuts of meat, processed or cured meats, and poultry with skin. Fill about one fourth of your plate with lean proteins, such as fish, chicken without skin, beans, eggs, or tofu. ? Avoid pre-made and processed foods. These tend to be higher in sodium, added sugar, and fat.  Reduce your daily sodium intake. Most people with hypertension should eat less than 1,500 mg of sodium a day.  Do not drink alcohol if: ? Your health care provider tells you not to drink. ? You are pregnant, may be pregnant, or are planning to become pregnant.  If you drink alcohol: ? Limit how much you use to:  0-1 drink a day for women.  0-2 drinks a day for men. ? Be aware of how much alcohol is in your drink. In the [place] one drink equals one 12 oz bottle of beer (355 mL), one 5 oz glass of wine (148 mL), or one 1 oz glass of hard liquor (44 mL). Lifestyle   Work with your health care provider to maintain a healthy body weight or to lose weight. Ask what an ideal weight is for you.  Get at least 30 minutes of exercise most days of the week. Activities may  include walking, swimming, or biking.  Include exercise to strengthen your muscles (resistance exercise), such as Pilates or lifting weights, as part of your weekly exercise routine. Try to do these types of exercises for 30 minutes at least 3 days a week.  Do not use any products that contain nicotine or tobacco, such as cigarettes, e-cigarettes, and chewing tobacco. If you need help quitting, ask your health care provider.  Monitor your blood pressure at home as told by your health care provider.  Keep all follow-up visits as told by your health care provider. This is important. Medicines  Take over-the-counter and prescription medicines only as told by your health care provider. Follow directions carefully. Blood pressure medicines must be taken as prescribed.  Do not skip doses of blood pressure medicine. Doing this puts you at risk for problems and can make the medicine less effective.  Ask your health care provider about side effects or reactions to medicines that you should watch  for. Contact a health care provider if you:  Think you are having a reaction to a medicine you are taking.  Have headaches that keep coming back (recurring).  Feel dizzy.  Have swelling in your ankles.  Have trouble with your vision. Get help right away if you:  Develop a severe headache or confusion.  Have unusual weakness or numbness.  Feel faint.  Have severe pain in your chest or abdomen.  Vomit repeatedly.  Have trouble breathing. Summary  Hypertension is when the force of blood pumping through your arteries is too strong. If this condition is not controlled, it may put you at risk for serious complications.  Your personal target blood pressure may vary depending on your medical conditions, your age, and other factors. For most people, a normal blood pressure is less than 120/80.  Hypertension is treated with lifestyle changes, medicines, or a combination of both. Lifestyle changes  include losing weight, eating a healthy, low-sodium diet, exercising more, and limiting alcohol. This information is not intended to replace advice given to you by your health care provider. Make sure you discuss any questions you have with your health care provider. Document Revised: 12/16/2017 Document Reviewed: 12/16/2017 Elsevier Patient Education  2020 Elsevier Inc.   Low-Sodium Eating Plan Sodium, which is an element that makes up salt, helps you maintain a healthy balance of fluids in your body. Too much sodium can increase your blood pressure and cause fluid and waste to be held in your body. Your health care provider or dietitian may recommend following this plan if you have high blood pressure (hypertension), kidney disease, liver disease, or heart failure. Eating less sodium can help lower your blood pressure, reduce swelling, and protect your heart, liver, and kidneys. What are tips for following this plan? General guidelines  Most people on this plan should limit their sodium intake to 1,500-2,000 mg (milligrams) of sodium each day. Reading food labels   The Nutrition Facts label lists the amount of sodium in one serving of the food. If you eat more than one serving, you must multiply the listed amount of sodium by the number of servings.  Choose foods with less than 140 mg of sodium per serving.  Avoid foods with 300 mg of sodium or more per serving. Shopping  Look for lower-sodium products, often labeled as "low-sodium" or "no salt added."  Always check the sodium content even if foods are labeled as "unsalted" or "no salt added".  Buy fresh foods. ? Avoid canned foods and premade or frozen meals. ? Avoid canned, cured, or processed meats  Buy breads that have less than 80 mg of sodium per slice. Cooking  Eat more home-cooked food and less restaurant, buffet, and fast food.  Avoid adding salt when cooking. Use salt-free seasonings or herbs instead of table salt or  sea salt. Check with your health care provider or pharmacist before using salt substitutes.  Cook with plant-based oils, such as canola, sunflower, or olive oil. Meal planning  When eating at a restaurant, ask that your food be prepared with less salt or no salt, if possible.  Avoid foods that contain MSG (monosodium glutamate). MSG is sometimes added to [place] food, bouillon, and some canned foods. What foods are recommended? The items listed may not be a complete list. Talk with your dietitian about what dietary choices are best for you. Grains Low-sodium cereals, including oats, puffed wheat and rice, and shredded wheat. Low-sodium crackers. Unsalted rice. Unsalted pasta. Low-sodium bread. Whole-grain  breads and whole-grain pasta. Vegetables Fresh or frozen vegetables. "No salt added" canned vegetables. "No salt added" tomato sauce and paste. Low-sodium or reduced-sodium tomato and vegetable juice. Fruits Fresh, frozen, or canned fruit. Fruit juice. Meats and other protein foods Fresh or frozen (no salt added) meat, poultry, seafood, and fish. Low-sodium canned tuna and salmon. Unsalted nuts. Dried peas, beans, and lentils without added salt. Unsalted canned beans. Eggs. Unsalted nut butters. Dairy Milk. Soy milk. Cheese that is naturally low in sodium, such as ricotta cheese, fresh mozzarella, or Swiss cheese Low-sodium or reduced-sodium cheese. Cream cheese. Yogurt. Fats and oils Unsalted butter. Unsalted margarine with no trans fat. Vegetable oils such as canola or olive oils. Seasonings and other foods Fresh and dried herbs and spices. Salt-free seasonings. Low-sodium mustard and ketchup. Sodium-free salad dressing. Sodium-free light mayonnaise. Fresh or refrigerated horseradish. Lemon juice. Vinegar. Homemade, reduced-sodium, or low-sodium soups. Unsalted popcorn and pretzels. Low-salt or salt-free chips. What foods are not recommended? The items listed may not be a complete list.  Talk with your dietitian about what dietary choices are best for you. Grains Instant hot cereals. Bread stuffing, pancake, and biscuit mixes. Croutons. Seasoned rice or pasta mixes. Noodle soup cups. Boxed or frozen macaroni and cheese. Regular salted crackers. Self-rising flour. Vegetables Sauerkraut, pickled vegetables, and relishes. Olives. [place] fries. Onion rings. Regular canned vegetables (not low-sodium or reduced-sodium). Regular canned tomato sauce and paste (not low-sodium or reduced-sodium). Regular tomato and vegetable juice (not low-sodium or reduced-sodium). Frozen vegetables in sauces. Meats and other protein foods Meat or fish that is salted, canned, smoked, spiced, or pickled. Bacon, ham, sausage, hotdogs, corned beef, chipped beef, packaged lunch meats, salt pork, jerky, pickled herring, anchovies, regular canned tuna, sardines, salted nuts. Dairy Processed cheese and cheese spreads. Cheese curds. Blue cheese. Feta cheese. String cheese. Regular cottage cheese. Buttermilk. Canned milk. Fats and oils Salted butter. Regular margarine. Ghee. Bacon fat. Seasonings and other foods Onion salt, garlic salt, seasoned salt, table salt, and sea salt. Canned and packaged gravies. Worcestershire sauce. Tartar sauce. Barbecue sauce. Teriyaki sauce. Soy sauce, including reduced-sodium. Steak sauce. Fish sauce. Oyster sauce. Cocktail sauce. Horseradish that you find on the shelf. Regular ketchup and mustard. Meat flavorings and tenderizers. Bouillon cubes. Hot sauce and Tabasco sauce. Premade or packaged marinades. Premade or packaged taco seasonings. Relishes. Regular salad dressings. Salsa. Potato and tortilla chips. Corn chips and puffs. Salted popcorn and pretzels. Canned or dried soups. Pizza. Frozen entrees and pot pies. Summary  Eating less sodium can help lower your blood pressure, reduce swelling, and protect your heart, liver, and kidneys.  Most people on this plan should limit their  sodium intake to 1,500-2,000 mg (milligrams) of sodium each day.  Canned, boxed, and frozen foods are high in sodium. Restaurant foods, fast foods, and pizza are also very high in sodium. You also get sodium by adding salt to food.  Try to cook at home, eat more fresh fruits and vegetables, and eat less fast food, canned, processed, or prepared foods. This information is not intended to replace advice given to you by your health care provider. Make sure you discuss any questions you have with your health care provider. Document Revised: 03/20/2017 Document Reviewed: 03/31/2016 Elsevier Patient Education  2020 [employer].

## 2020-02-08 LAB — CBC WITH DIFFERENTIAL/PLATELET
Basophils Absolute: 0 10*3/uL (ref 0.0–0.2)
Basos: 1 %
EOS (ABSOLUTE): 0.4 10*3/uL (ref 0.0–0.4)
Eos: 8 %
Hematocrit: 43 % (ref 34.0–46.6)
Hemoglobin: 13.9 g/dL (ref 11.1–15.9)
Immature Grans (Abs): 0 10*3/uL (ref 0.0–0.1)
Immature Granulocytes: 0 %
Lymphocytes Absolute: 1.2 10*3/uL (ref 0.7–3.1)
Lymphs: 26 %
MCH: 31.4 pg (ref 26.6–33.0)
MCHC: 32.3 g/dL (ref 31.5–35.7)
MCV: 97 fL (ref 79–97)
Monocytes Absolute: 0.6 10*3/uL (ref 0.1–0.9)
Monocytes: 14 %
Neutrophils Absolute: 2.3 10*3/uL (ref 1.4–7.0)
Neutrophils: 51 %
Platelets: 310 10*3/uL (ref 150–450)
RBC: 4.42 x10E6/uL (ref 3.77–5.28)
RDW: 12.5 % (ref 11.7–15.4)
WBC: 4.6 10*3/uL (ref 3.4–10.8)

## 2020-02-08 LAB — COMPREHENSIVE METABOLIC PANEL
ALT: 10 IU/L (ref 0–32)
AST: 15 IU/L (ref 0–40)
Albumin/Globulin Ratio: 1.7 (ref 1.2–2.2)
Albumin: 4 g/dL (ref 3.8–4.8)
Alkaline Phosphatase: 83 IU/L (ref 44–121)
BUN/Creatinine Ratio: 13 (ref 9–23)
BUN: 10 mg/dL (ref 6–24)
Bilirubin Total: 0.3 mg/dL (ref 0.0–1.2)
CO2: 24 mmol/L (ref 20–29)
Calcium: 8.9 mg/dL (ref 8.7–10.2)
Chloride: 104 mmol/L (ref 96–106)
Creatinine, Ser: 0.78 mg/dL (ref 0.57–1.00)
GFR calc Af Amer: 105 mL/min/{1.73_m2} (ref >59)
GFR calc non Af Amer: 91 mL/min/{1.73_m2} (ref >59)
Globulin, Total: 2.3 g/dL (ref 1.5–4.5)
Glucose: 66 mg/dL (ref 65–99)
Potassium: 4.1 mmol/L (ref 3.5–5.2)
Sodium: 139 mmol/L (ref 134–144)
Total Protein: 6.3 g/dL (ref 6.0–8.5)

## 2020-02-08 LAB — TSH: TSH: 1.43 u[IU]/mL (ref 0.450–4.500)

## 2020-02-08 LAB — VITAMIN D 25 HYDROXY (VIT D DEFICIENCY, FRACTURES): Vit D, 25-Hydroxy: 77.6 ng/mL (ref 30.0–100.0)

## 2020-02-08 NOTE — Progress Notes (Signed)
Labs within normal limits. Continue current plan and follow up as directed at visit and as needed.

## 2020-02-27 ENCOUNTER — Other Ambulatory Visit: Payer: Self-pay | Admitting: Adult Health

## 2020-03-31 ENCOUNTER — Other Ambulatory Visit: Payer: Self-pay | Admitting: Adult Health

## 2020-04-11 ENCOUNTER — Telehealth: Payer: Self-pay | Admitting: Adult Health

## 2020-04-11 NOTE — Telephone Encounter (Signed)
Needs appointment for evaluation.

## 2020-04-11 NOTE — Telephone Encounter (Signed)
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 Pt denied appt and is requesting to have something sent in for her. Please advise.       [id number] [place], [place] - [address]  [place] [address]  Phone: [phone] Fax: [phone]  Hours: Not open 24 hours

## 2020-04-11 NOTE — Telephone Encounter (Signed)
CRM # [id number] Owner: None Status: Resolved Priority: Routine Created on: 04/11/2020 03:28 PM By: Benjiman Core, CMA    Primary Information  Source Subject Topic  Isabella Burch (Patient) Isabella Burch (Patient) Quick Communication - See Telephone Encounter  CRM for notification. See Telephone encounter for: 04/11/20. Tried calling patient to advise that she needs to schedule appointment for evaluation of back pain. OK for [facility] to schedule appointment with any provider that has an opening.   Pt states she will just go back to [facility] that she had [facility], offered appt, refused

## 2020-04-11 NOTE — Telephone Encounter (Signed)
Noted  

## 2020-04-11 NOTE — Telephone Encounter (Signed)
Patient returned call and was advised of message below by [facility] agent  Karl Pock. Patient declined scheduling an appointment. See phone message.

## 2020-04-11 NOTE — Telephone Encounter (Signed)
Tried calling patient. Left message to call back. OK for [facility] to advise of message below and schedule appointment with any provider that has an opening.

## 2020-04-27 ENCOUNTER — Ambulatory Visit: Payer: Self-pay

## 2020-04-27 ENCOUNTER — Other Ambulatory Visit: Payer: Self-pay

## 2020-04-27 ENCOUNTER — Emergency Department
Admission: EM | Admit: 2020-04-27 | Discharge: 2020-04-27 | Disposition: A | Discharge status: Home / Self Care | Payer: Managed Care, Other (non HMO) | Attending: Emergency Medicine | Admitting: Emergency Medicine

## 2020-04-27 DIAGNOSIS — R112 Nausea with vomiting, unspecified: Secondary | ICD-10-CM | POA: Diagnosis not present

## 2020-04-27 DIAGNOSIS — Z79899 Other long term (current) drug therapy: Secondary | ICD-10-CM | POA: Diagnosis not present

## 2020-04-27 DIAGNOSIS — R42 Dizziness and giddiness: Secondary | ICD-10-CM | POA: Diagnosis not present

## 2020-04-27 DIAGNOSIS — I10 Essential (primary) hypertension: Secondary | ICD-10-CM | POA: Diagnosis not present

## 2020-04-27 DIAGNOSIS — F17210 Nicotine dependence, cigarettes, uncomplicated: Secondary | ICD-10-CM | POA: Diagnosis not present

## 2020-04-27 LAB — BASIC METABOLIC PANEL
Anion gap: 8 (ref 5–15)
BUN: 12 mg/dL (ref 6–20)
CO2: 27 mmol/L (ref 22–32)
Calcium: 9.1 mg/dL (ref 8.9–10.3)
Chloride: 102 mmol/L (ref 98–111)
Creatinine, Ser: 0.74 mg/dL (ref 0.44–1.00)
GFR, Estimated: >60 mL/min (ref >60)
Glucose, Bld: 109 mg/dL — ABNORMAL HIGH (ref 70–99)
Potassium: 3.9 mmol/L (ref 3.5–5.1)
Sodium: 137 mmol/L (ref 135–145)

## 2020-04-27 LAB — CBC
HCT: 42.4 % (ref 36.0–46.0)
Hemoglobin: 14.4 g/dL (ref 12.0–15.0)
MCH: 32.8 pg (ref 26.0–34.0)
MCHC: 34 g/dL (ref 30.0–36.0)
MCV: 96.6 fL (ref 80.0–100.0)
Platelets: 278 10*3/uL (ref 150–400)
RBC: 4.39 MIL/uL (ref 3.87–5.11)
RDW: 12.3 % (ref 11.5–15.5)
WBC: 6.6 10*3/uL (ref 4.0–10.5)
nRBC: 0 % (ref 0.0–0.2)

## 2020-04-27 MED ORDER — MECLIZINE HCL 25 MG PO TABS: 25.0000 mg | ORAL_TABLET | Freq: Three times a day (TID) | ORAL | 0 refills | Status: DC | PRN

## 2020-04-27 MED ORDER — ONDANSETRON 4 MG PO TBDP: 4.0000 mg | ORAL_TABLET | Freq: Three times a day (TID) | ORAL | 0 refills | Status: DC | PRN

## 2020-04-27 MED ORDER — DIAZEPAM 5 MG PO TABS: 5.0000 mg | ORAL_TABLET | Freq: Three times a day (TID) | ORAL | 0 refills | Status: AC | PRN

## 2020-04-27 NOTE — Telephone Encounter (Signed)
FYI. KW 

## 2020-04-27 NOTE — Telephone Encounter (Signed)
Patient called with C/O vertigo that started last night.  She states that she has had vertigo in 2014.  She states that she is able to walk but must be careful.  She dentes fever or other symptoms.  Per protocol patient will go to [facility]. No in office visit remain for today. Care advice read to patient.  She verbalized understanding.   Reason for Disposition . Taking a medicine that could cause dizziness (e.g., blood pressure medications, diuretics)  Answer Assessment - Initial Assessment Questions 1. DESCRIPTION: "Describe your dizziness."     Lightheaded to vertigo 2. LIGHTHEADED: "Do you feel lightheaded?" (e.g., somewhat faint, woozy, weak upon standing)    Feels woozy 3. VERTIGO: "Do you feel like either you or the room is spinning or tilting?" (i.e. vertigo)     yes 4. SEVERITY: "How bad is it?"  "Do you feel like you are going to faint?" "Can you stand and walk?"   - MILD: Feels slightly dizzy, but walking normally.   - MODERATE: Feels very unsteady when walking, but not falling; interferes with normal activities (e.g., school, work) .   - SEVERE: Unable to walk without falling, or requires assistance to walk without falling; feels like passing out now.      A little unsteady 5. ONSET:  "When did the dizziness begin?"     Middle of night 6. AGGRAVATING FACTORS: "Does anything make it worse?" (e.g., standing, change in head position)     Changing position 7. HEART RATE: "Can you tell me your heart rate?" "How many beats in 15 seconds?"  (Note: not all patients can do this)      74 8. CAUSE: "What do you think is causing the dizziness?"    vertigo 9. RECURRENT SYMPTOM: "Have you had dizziness before?" If Yes, ask: "When was the last time?" "What happened that time?"    Yes 2014 10. OTHER SYMPTOMS: "Do you have any other symptoms?" (e.g., fever, chest pain, vomiting, diarrhea, bleeding)      nausea 11. PREGNANCY: "Is there any chance you are pregnant?" "When was your  last menstrual period?"      No irregular  Protocols used: [facility]

## 2020-04-27 NOTE — ED Provider Notes (Signed)
[facility] Emergency Department Provider Note  ____________________________________________   Event Date/Time   First MD Initiated Contact with Patient 04/27/20 1843     (approximate)  I have reviewed the triage vital signs and the nursing notes.   HISTORY  Chief Complaint Dizziness    HPI Isabella Burch is a 50 y.o. female with anxiety, hypertension, rheumatoid arthritis who comes in for vertigo.  Patient states that she had vertigo in the past.  She states she is taking meclizine without much relief.  She states that her symptoms have been going on for 1 day.  States that her symptoms are not there at rest but if she moves her head at all her symptoms come on.  Feels like the room is spinning.  She tried Epley maneuvers but it made her more nauseous.  She is also had Zofran in the past that seemed to help one of the meclizine.  She denies any neck injuries or recent chiropractor.  She still able to ambulate well.  It was associate with a little bit of nausea and episode of vomiting.  Denies any chest pain or shortness of breath.          Past Medical History:  Diagnosis Date  . Anxiety   . Extensor tendon rupture, non-traumatic, hand and wrist    right  . Heart murmur   . History of kidney stones   . Hypertension   . Rheumatoid arthritis ([place]) dx 2006   BILATERAL HANDS AND RIGHT ANKLE    Patient Active Problem List   Diagnosis Date Noted  . Vitamin D deficiency 02/07/2020  . Anxiety 06/14/2019  . Acute otitis externa of left ear 06/14/2019  . Eustachian tube dysfunction, bilateral 06/14/2019  . Low serum vitamin D 06/14/2019  . Tobacco use disorder, continuous 06/14/2019  . Rheumatoid arthritis ([place]) 11/22/2015  . Chest pain 11/22/2015  . Panic attacks 12/04/2014  . Benign essential HTN 10/08/2014  . Screening mammogram for breast cancer 02/25/2013    Past Surgical History:  Procedure Laterality Date  . CARDIAC VALVE REPLACEMENT    .  CYSTOSCOPY WITH URETEROSCOPY, STONE BASKETRY AND STENT PLACEMENT  2011  . LEFT WRIST EXTENSOR TENDON REPAIR    . TENDON TRANSFER Right 11/09/2014   Procedure: RIGHT THUMB EIP TO EPL TENDON TRANSFER;  Surgeon: Iran Planas, MD;  Location: [place];  Service: Orthopedics;  Laterality: Right;  . TUBAL LIGATION      Prior to Admission medications   Medication Sig Start Date End Date Taking? Authorizing Provider  DULoxetine (CYMBALTA) 30 MG capsule Take 1 capsule (30 mg total) by mouth daily. 02/07/20 03/08/20  Flinchum, Kelby Aline, FNP  fluticasone (FLONASE) 50 MCG/ACT nasal spray SPRAY 2 SPRAYS INTO EACH NOSTRIL EVERY DAY 07/07/19   Flinchum, Kelby Aline, FNP  hydrochlorothiazide (HYDRODIURIL) 12.5 MG tablet Take 1 tablet (12.5 mg total) by mouth daily. 02/07/20   Flinchum, Kelby Aline, FNP  leflunomide (ARAVA) 10 MG tablet TAKE 1 TABLET BY MOUTH ONCE A DAY 07/17/14   [provider]  losartan (COZAAR) 25 MG tablet Take 1 tablet (25 mg total) by mouth daily. 02/07/20   Flinchum, Kelby Aline, FNP  NEOMYCIN-POLYMYXIN-HYDROCORTISONE (CORTISPORIN) 1 % SOLN OTIC solution Place 3 drops into the right ear every 6 (six) hours. 06/14/19   Flinchum, Kelby Aline, FNP  ORENCIA CLICKJECT 008 MG/ML SOAJ Inject 1 Syringe into the skin once a week. 05/30/19   [provider]  tiZANidine (ZANAFLEX) 4 MG  tablet Take by mouth. Patient not taking: Reported on 02/07/2020 10/05/18   [provider]  VITAMIN D PO Take by mouth. 4000IU    [provider]    Allergies Enbrel [etanercept] and Humira [adalimumab]  Family History  Problem Relation Age of Onset  . Hyperlipidemia Father   . Hypertension Father   . Diabetes Father   . Hypothyroidism Mother     Social History Social History   Tobacco Use  . Smoking status: Current Every Day Smoker    Packs/day: 0.50    Years: 25.00    Pack years: 12.50    Types: Cigarettes  . Smokeless tobacco: Never Used  Substance  Use Topics  . Alcohol use: No    Alcohol/week: 0.0 standard drinks  . Drug use: No      Review of Systems Constitutional: No fever/chills Eyes: No visual changes. ENT: No sore throat. Cardiovascular: Denies chest pain. Respiratory: Denies shortness of breath. Gastrointestinal: No abdominal pain.  Positive nausea and vomiting no diarrhea.  No constipation. Genitourinary: Negative for dysuria. Musculoskeletal: Negative for back pain. Skin: Negative for rash. Neurological: Negative for headaches, focal weakness or numbness.  Positive dizziness All other ROS negative ____________________________________________   PHYSICAL EXAM:  VITAL SIGNS: ED Triage Vitals  Enc Vitals Group     BP 04/27/20 1449 (!) 144/94     Pulse Rate 04/27/20 1449 66     Resp 04/27/20 1449 17     Temp 04/27/20 1449 98.1 F (36.7 C)     Temp Source 04/27/20 1449 Oral     SpO2 04/27/20 1449 100 %     Weight 04/27/20 1451 135 lb (61.2 kg)     Height 04/27/20 1451 5\' 6"  (1.676 m)     Head Circumference --      Peak Flow --      Pain Score 04/27/20 1450 0     Pain Loc --      Pain Edu? --      Excl. in GC? --     Constitutional: Alert and oriented. Well appearing and in no acute distress. Eyes: Conjunctivae are normal. EOMI. Head: Atraumatic. Nose: No congestion/rhinnorhea. Mouth/Throat: Mucous membranes are moist.   Neck: No stridor. Trachea Midline. FROM Cardiovascular: Normal rate, regular rhythm. Grossly normal heart sounds.  Good peripheral circulation. Respiratory: Normal respiratory effort.  No retractions. Lungs CTAB. Gastrointestinal: Soft and nontender. No distention. No abdominal bruits.  Musculoskeletal: No lower extremity tenderness nor edema.  No joint effusions. Neurologic:  Normal speech and language. No gross focal neurologic deficits are appreciated.  Cranial 2 through 12 are intact.  Equal strength in arms and legs.  Finger-to-nose normal.  Heel-to-shin normal.  Negative Romberg.   Ambulates around the room with a steady gait. Skin:  Skin is warm, dry and intact. No rash noted. Psychiatric: Mood and affect are normal. Speech and behavior are normal. GU: Deferred   ____________________________________________   LABS (all labs ordered are listed, but only abnormal results are displayed)  Labs Reviewed  BASIC METABOLIC PANEL - Abnormal; Notable for the following components:      Result Value   Glucose, Bld 109 (*)    All other components within normal limits  CBC   ____________________________________________   INITIAL IMPRESSION / ASSESSMENT AND PLAN / ED COURSE  Isabella Burch was evaluated in Emergency Department on 04/27/2020 for the symptoms described in the history of present illness. She was evaluated in the context of the global COVID-19 pandemic, which  necessitated consideration that the patient might be at risk for infection with the SARS-CoV-2 virus that causes COVID-19. Institutional protocols and algorithms that pertain to the evaluation of patients at risk for COVID-19 are in a state of rapid change based on information released by regulatory bodies including the CDC and federal and state organizations. These policies and algorithms were followed during the patient's care in the ED.    I suspect this is most likely secondary to vertigo.  It is very positional in nature when she moves her head.  TMs were observed without evidence of impaction.  I have low suspicion for stroke given normal neuro exam, negative cerebellar signs.  I have low suspicion for neck dissection given no mechanism.  Labs ordered to evaluate for electrolyte abnormalities, AKI which are reassuring.  No chest pain to suggest ACS and very much associated with movement of her head.  Patient had been waiting the waiting room for 5 hours.  We discussed trialing the medications and see how she does versus getting a prescription.  Patient prescribed to go home at this time.  Patient instructed to  try the meclizine and Zofran again and if is not getting better in 2 hours that she can use the Valium instead of the meclizine.  She understands not to drive while on this.  She understands not to use it with any other sedating medications.  I discussed the provisional nature of ED diagnosis, the treatment so far, the ongoing plan of care, follow up appointments and return precautions with the patient and any family or support people present. They expressed understanding and agreed with the plan, discharged home.           ____________________________________________   FINAL CLINICAL IMPRESSION(S) / ED DIAGNOSES   Final diagnoses:  Vertigo      MEDICATIONS GIVEN DURING THIS VISIT:  Medications - No data to display   ED Discharge Orders         Ordered    meclizine (ANTIVERT) 25 MG tablet  3 times daily PRN        04/27/20 1906    diazepam (VALIUM) 5 MG tablet  Every 8 hours PRN        04/27/20 1906    ondansetron (ZOFRAN ODT) 4 MG disintegrating tablet  Every 8 hours PRN        04/27/20 1906           Note:  This document was prepared using Dragon voice recognition software and may include unintentional dictation errors.   Concha Se, MD 04/27/20 [phone]

## 2020-04-27 NOTE — ED Triage Notes (Signed)
Pt c/o dizziness/spinning since this morning with N/V.Marland Kitchen states she has a hx of vertigo but never had vomiting with it, pt is in NAD at present.,

## 2020-04-27 NOTE — Telephone Encounter (Signed)
Noted  

## 2020-04-27 NOTE — Discharge Instructions (Signed)
Take the meclizine with the Zofran.  If the symptoms are not getting better in 2 hours you may take a Valium.  If the Valium works you can stop taking the meclizine and do the Valium and Zofran only.  Do not drive while on the Valium.  If your symptoms are worsening or your symptoms start while at rest please return to the ER

## 2020-05-07 ENCOUNTER — Other Ambulatory Visit: Payer: Self-pay | Admitting: Adult Health

## 2020-05-07 NOTE — Telephone Encounter (Signed)
Requested Prescriptions  Pending Prescriptions Disp Refills  . hydrochlorothiazide (HYDRODIURIL) 12.5 MG tablet [Pharmacy Med Name: HYDROCHLOROTHIAZIDE 12.5 MG TB] 30 tablet 0    Sig: TAKE 1 TABLET BY MOUTH EVERY DAY     Cardiovascular: Diuretics - Thiazide Failed - 05/07/2020  9:31 AM      Failed - Last BP in normal range    BP Readings from Last 1 Encounters:  04/27/20 (!) 158/103         Passed - Ca in normal range and within 360 days    Calcium  Date Value Ref Range Status  04/27/2020 9.1 8.9 - 10.3 mg/dL Final         Passed - Cr in normal range and within 360 days    Creatinine, Ser  Date Value Ref Range Status  04/27/2020 0.74 0.44 - 1.00 mg/dL Final         Passed - K in normal range and within 360 days    Potassium  Date Value Ref Range Status  04/27/2020 3.9 3.5 - 5.1 mmol/L Final         Passed - Na in normal range and within 360 days    Sodium  Date Value Ref Range Status  04/27/2020 137 135 - 145 mmol/L Final  02/07/2020 139 134 - 144 mmol/L Final         Passed - Valid encounter within last 6 months    Recent Outpatient Visits          3 months ago Anxiety   [facility] Flinchum, Eula Fried, FNP   10 months ago Low serum vitamin D   [facility], Eula Fried, FNP   12 months ago Encounter for routine adult health examination without abnormal findings   [facility], Eula Fried, FNP   1 year ago Rheumatoid arthritis involving multiple sites, unspecified whether rheumatoid factor present (HCC)   [facility] Flinchum, Eula Fried, FNP      Future Appointments            In 1 week Flinchum, Eula Fried, FNP [facility], [facility]           . losartan (COZAAR) 25 MG tablet [Pharmacy Med Name: LOSARTAN POTASSIUM 25 MG TAB] 30 tablet 0    Sig: TAKE 1 TABLET BY MOUTH EVERY DAY     Cardiovascular:  Angiotensin Receptor Blockers Failed - 05/07/2020  9:31 AM      Failed -  Last BP in normal range    BP Readings from Last 1 Encounters:  04/27/20 (!) 158/103         Passed - Cr in normal range and within 180 days    Creatinine, Ser  Date Value Ref Range Status  04/27/2020 0.74 0.44 - 1.00 mg/dL Final         Passed - K in normal range and within 180 days    Potassium  Date Value Ref Range Status  04/27/2020 3.9 3.5 - 5.1 mmol/L Final         Passed - Patient is not pregnant      Passed - Valid encounter within last 6 months    Recent Outpatient Visits          3 months ago Anxiety   [facility] Flinchum, Eula Fried, FNP   10 months ago Low serum vitamin D   [facility] Flinchum, Eula Fried, FNP   12 months ago Encounter for routine adult  health examination without abnormal findings   [facility] Flinchum, Eula Fried, FNP   1 year ago Rheumatoid arthritis involving multiple sites, unspecified whether rheumatoid factor present [facility])   [facility] Flinchum, Eula Fried, FNP      Future Appointments            In 1 week Flinchum, Eula Fried, FNP [facility], [facility]

## 2020-05-08 ENCOUNTER — Other Ambulatory Visit: Payer: Self-pay | Admitting: Adult Health

## 2020-05-14 NOTE — Progress Notes (Signed)
Established patient visit   Patient: Isabella Burch   DOB: 05/10/1970   50 y.o. Female  MRN: [id number] Visit Date: 05/15/2020  Today's healthcare provider: Jairo Ben, FNP   Chief Complaint  Patient presents with   Anxiety   Hypertension   Subjective    HPI  Anxiety, Follow-up  She was last seen for anxiety 3 months ago. Changes made at last visit include  Increased Cymbalta to 30mg .   She reports excellent compliance with treatment. She reports excellent tolerance of treatment. She is not having side effects.   She feels her anxiety is mild and Improved since last visit.  Symptoms: No chest pain Yes difficulty concentrating  Yes dizziness Yes fatigue  No feelings of losing control No insomnia  No irritable No palpitations  Yes panic attacks Yes racing thoughts  Yes shortness of breath Yes sweating  Yes tremors/shakes     She does have some chronic shortness of breath for years mostly with exerting or climbing stairs , wonders if it is " due to masks' now.  Denies any chest pain, jaw pain.  Patient  denies any fever, body aches,chills, rash, chest pain,  nausea, vomiting, or diarrhea.   GAD-7 Results GAD-7 Generalized Anxiety Disorder Screening Tool 02/07/2020 04/05/2019  1. Feeling Nervous, Anxious, or on Edge 1 2  2. Not Being Able to Stop or Control Worrying 1 2  3. Worrying Too Much About Different Things 1 2  4. Trouble Relaxing 1 2  5. Being So Restless it's Hard To Sit Still 1 1  6. Becoming Easily Annoyed or Irritable 1 2  7. Feeling Afraid As If Something Awful Might Happen 1 2  Total GAD-7 Score 7 13  Difficulty At Work, Home, or Getting  Along With Others? Somewhat difficult -    PHQ-9 Scores PHQ9 SCORE ONLY 02/07/2020 04/05/2019 02/25/2013  PHQ-9 Total Score 10 4 1    CBC and CMP on 04/27/2020 ok.  --------------------------------------------------------------------------------------------------- Hypertension, follow-up  BP  Readings from Last 3 Encounters:  05/15/20 132/85  04/27/20 (!) 158/103  02/07/20 (!) 132/93   Wt Readings from Last 3 Encounters:  05/15/20 135 lb 9.6 oz (61.5 kg)  04/27/20 135 lb (61.2 kg)  02/07/20 137 lb 12.8 oz (62.5 kg)     She was last seen for hypertension 3 months ago.  BP at that visit was 132/93. Management since that visit includes continue Losartan 25mg  and HCTZ 12.5mg .  She reports excellent compliance with treatment. She is not having side effects.  She is following a Regular diet. She is exercising. She does not smoke.  Use of agents associated with hypertension: none.   Outside blood pressures are systolic ranging in the 150s and diastolic 80-100s. Symptoms: No chest pain No chest pressure  No palpitations No syncope  Yes dyspnea No orthopnea  No paroxysmal nocturnal dyspnea No lower extremity edema   Pertinent labs: Lab Results  Component Value Date   CHOL 182 04/05/2019   HDL 53 04/05/2019   LDLCALC 113 (H) 04/05/2019   TRIG 86 04/05/2019   CHOLHDL 3 11/21/2015   Lab Results  Component Value Date   NA 137 04/27/2020   K 3.9 04/27/2020   CREATININE 0.74 04/27/2020   GFRNONAA >60 04/27/2020   GFRAA 105 02/07/2020   GLUCOSE 109 (H) 04/27/2020     The 10-year ASCVD risk score 06/25/2020 DC Jr., et al., 2013) is: 4.2%   ---------------------------------------------------------------------------------------------------   Patient Active Problem List  Diagnosis Date Noted   Shortness of breath- occasional 05/15/2020   Vitamin D deficiency 02/07/2020   Anxiety 06/14/2019   Acute otitis externa of left ear 06/14/2019   Eustachian tube dysfunction, bilateral 06/14/2019   Low serum vitamin D 06/14/2019   Tobacco use disorder, continuous 06/14/2019   Rheumatoid arthritis (HCC) 11/22/2015   Chest pain 11/22/2015   Panic attacks 12/04/2014   Hypertension 10/08/2014   Screening mammogram for breast cancer 02/25/2013   Past Medical History:   Diagnosis Date   Anxiety    Extensor tendon rupture, non-traumatic, hand and wrist    right   Heart murmur    History of kidney stones    Hypertension    Rheumatoid arthritis (HCC) dx 2006   BILATERAL HANDS AND RIGHT ANKLE   Family History  Problem Relation Age of Onset   Hyperlipidemia Father    Hypertension Father    Diabetes Father    Hypothyroidism Mother    Allergies  Allergen Reactions   Enbrel [Etanercept] Swelling   Humira [Adalimumab] Other (See Comments)    kidney dysfunction       Medications: Outpatient Medications Prior to Visit  Medication Sig   DULoxetine (CYMBALTA) 30 MG capsule TAKE 1 CAPSULE BY MOUTH EVERY DAY   fluticasone (FLONASE) 50 MCG/ACT nasal spray SPRAY 2 SPRAYS INTO EACH NOSTRIL EVERY DAY   leflunomide (ARAVA) 10 MG tablet TAKE 1 TABLET BY MOUTH ONCE A DAY   losartan (COZAAR) 25 MG tablet TAKE 1 TABLET BY MOUTH EVERY DAY   meclizine (ANTIVERT) 25 MG tablet Take 1 tablet (25 mg total) by mouth 3 (three) times daily as needed for dizziness.   NEOMYCIN-POLYMYXIN-HYDROCORTISONE (CORTISPORIN) 1 % SOLN OTIC solution Place 3 drops into the right ear every 6 (six) hours.   ondansetron (ZOFRAN ODT) 4 MG disintegrating tablet Take 1 tablet (4 mg total) by mouth every 8 (eight) hours as needed for nausea or vomiting.   ORENCIA CLICKJECT 125 MG/ML SOAJ Inject 1 Syringe into the skin once a week.   VITAMIN D PO Take by mouth. 4000IU   [DISCONTINUED] hydrochlorothiazide (HYDRODIURIL) 12.5 MG tablet TAKE 1 TABLET BY MOUTH EVERY DAY   tiZANidine (ZANAFLEX) 4 MG tablet Take by mouth. (Patient not taking: Reported on 05/15/2020)   No facility-administered medications prior to visit.    Review of Systems  Constitutional: Negative.   Respiratory: Positive for shortness of breath. Negative for apnea, cough, choking, chest tightness, wheezing and stridor.   Cardiovascular: Negative.   Gastrointestinal: Negative.   Genitourinary: Negative.    Musculoskeletal: Negative.   Neurological: Negative.   Psychiatric/Behavioral: Negative.     Last CBC Lab Results  Component Value Date   WBC 6.6 04/27/2020   HGB 14.4 04/27/2020   HCT 42.4 04/27/2020   MCV 96.6 04/27/2020   MCH 32.8 04/27/2020   RDW 12.3 04/27/2020   PLT 278 04/27/2020   Last metabolic panel Lab Results  Component Value Date   GLUCOSE 109 (H) 04/27/2020   NA 137 04/27/2020   K 3.9 04/27/2020   CL 102 04/27/2020   CO2 27 04/27/2020   BUN 12 04/27/2020   CREATININE 0.74 04/27/2020   GFRNONAA >60 04/27/2020   GFRAA 105 02/07/2020   CALCIUM 9.1 04/27/2020   PROT 6.3 02/07/2020   ALBUMIN 4.0 02/07/2020   LABGLOB 2.3 02/07/2020   AGRATIO 1.7 02/07/2020   BILITOT 0.3 02/07/2020   ALKPHOS 83 02/07/2020   AST 15 02/07/2020   ALT 10 02/07/2020   ANIONGAP 8  04/27/2020       Objective    BP 132/85    Pulse 83    Temp 97.8 F (36.6 C) (Oral)    Resp 16    Wt 135 lb 9.6 oz (61.5 kg)    SpO2 99%    BMI 21.89 kg/m  BP Readings from Last 3 Encounters:  05/15/20 132/85  04/27/20 (!) 158/103  02/07/20 (!) 132/93   Wt Readings from Last 3 Encounters:  05/15/20 135 lb 9.6 oz (61.5 kg)  04/27/20 135 lb (61.2 kg)  02/07/20 137 lb 12.8 oz (62.5 kg)       Physical Exam Vitals reviewed.  Constitutional:      General: She is not in acute distress.    Appearance: She is well-developed. She is not diaphoretic.     Interventions: She is not intubated.    Comments: Patient is alert and oriented and responsive to questions Engages in eye contact with provider. Speaks in full sentences without any pauses without any shortness of breath or distress.    HENT:     Head: Normocephalic and atraumatic.     Right Ear: External ear normal.     Left Ear: External ear normal.     Nose: Nose normal.     Mouth/Throat:     Pharynx: No oropharyngeal exudate.  Eyes:     General: Lids are normal. No scleral icterus.       Right eye: No discharge.        Left eye: No  discharge.     Conjunctiva/sclera: Conjunctivae normal.     Right eye: Right conjunctiva is not injected. No exudate or hemorrhage.    Left eye: Left conjunctiva is not injected. No exudate or hemorrhage.    Pupils: Pupils are equal, round, and reactive to light.  Neck:     Thyroid: No thyroid mass or thyromegaly.     Vascular: Normal carotid pulses. No carotid bruit, hepatojugular reflux or JVD.     Trachea: Trachea and phonation normal. No tracheal tenderness or tracheal deviation.     Meningeal: Brudzinski's sign and Kernig's sign absent.  Cardiovascular:     Rate and Rhythm: Normal rate and regular rhythm.     Pulses: Normal pulses.          Radial pulses are 2+ on the right side and 2+ on the left side.       Dorsalis pedis pulses are 2+ on the right side and 2+ on the left side.       Posterior tibial pulses are 2+ on the right side and 2+ on the left side.     Heart sounds: Normal heart sounds, S1 normal and S2 normal. Heart sounds not distant. No murmur heard. No friction rub. No gallop.   Pulmonary:     Effort: Pulmonary effort is normal. No tachypnea, bradypnea, accessory muscle usage or respiratory distress. She is not intubated.     Breath sounds: Normal breath sounds. No stridor. No wheezing or rales.  Chest:     Chest wall: No tenderness.  Breasts:     Right: No supraclavicular adenopathy.     Left: No supraclavicular adenopathy.    Abdominal:     General: Bowel sounds are normal. There is no distension or abdominal bruit.     Palpations: Abdomen is soft. There is no shifting dullness, fluid wave, hepatomegaly, splenomegaly, mass or pulsatile mass.     Tenderness: There is no abdominal tenderness. There is no guarding or  rebound.     Hernia: No hernia is present.  Musculoskeletal:        General: No tenderness or deformity. Normal range of motion.     Cervical back: Full passive range of motion without pain, normal range of motion and neck supple. No edema, erythema  or rigidity. No spinous process tenderness or muscular tenderness. Normal range of motion.  Lymphadenopathy:     Head:     Right side of head: No submental, submandibular, tonsillar, preauricular, posterior auricular or occipital adenopathy.     Left side of head: No submental, submandibular, tonsillar, preauricular, posterior auricular or occipital adenopathy.     Cervical: No cervical adenopathy.     Right cervical: No superficial, deep or posterior cervical adenopathy.    Left cervical: No superficial, deep or posterior cervical adenopathy.     Upper Body:     Right upper body: No supraclavicular or pectoral adenopathy.     Left upper body: No supraclavicular or pectoral adenopathy.  Skin:    General: Skin is warm and dry.     Coloration: Skin is not pale.     Findings: No abrasion, bruising, burn, ecchymosis, erythema, lesion, petechiae or rash.     Nails: There is no clubbing.  Neurological:     Mental Status: She is alert and oriented to person, place, and time.     GCS: GCS eye subscore is 4. GCS verbal subscore is 5. GCS motor subscore is 6.     Cranial Nerves: No cranial nerve deficit.     Sensory: No sensory deficit.     Motor: No tremor, atrophy, abnormal muscle tone or seizure activity.     Coordination: Coordination normal.     Gait: Gait normal.     Deep Tendon Reflexes: Reflexes are normal and symmetric. Reflexes normal. Babinski sign absent on the right side. Babinski sign absent on the left side.     Reflex Scores:      Tricep reflexes are 2+ on the right side and 2+ on the left side.      Bicep reflexes are 2+ on the right side and 2+ on the left side.      Brachioradialis reflexes are 2+ on the right side and 2+ on the left side.      Patellar reflexes are 2+ on the right side and 2+ on the left side.      Achilles reflexes are 2+ on the right side and 2+ on the left side. Psychiatric:        Speech: Speech normal.        Behavior: Behavior normal.        Thought  Content: Thought content normal.        Judgment: Judgment normal.      No results found for any visits on 05/15/20.  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    Answer:   [facility] Flags discussed. The patient was given clear instructions to  go to ER or return to medical center if any red flags develop, symptoms do not improve, worsen or new problems develop. They verbalized understanding.  Smoking cessation instruction/counseling given:  counseled patient on the dangers of tobacco use, advised patient to stop smoking, and reviewed strategies to maximize success  Return in about 3 months (around 08/13/2020), or if symptoms worsen or fail to improve, for Go to Emergency room/ urgent care if worse, at any time for any worsening symptoms.      The entirety of the information documented in the History of Present Illness, Review of Systems and Physical Exam were personally obtained by me. Portions of this information were initially documented by the CMA and reviewed by me for thoroughness and accuracy.      Jairo Ben, FNP  [facility] [phone] (phone) [phone] (fax)  [facility]

## 2020-05-15 ENCOUNTER — Other Ambulatory Visit: Payer: Self-pay

## 2020-05-15 ENCOUNTER — Ambulatory Visit
Admission: RE | Admit: 2020-05-15 | Discharge: 2020-05-15 | Disposition: A | Discharge status: Home / Self Care | Payer: Managed Care, Other (non HMO) | Source: Ambulatory Visit | Attending: Adult Health | Admitting: Adult Health

## 2020-05-15 ENCOUNTER — Telehealth: Payer: Self-pay

## 2020-05-15 ENCOUNTER — Encounter: Payer: Self-pay | Admitting: Adult Health

## 2020-05-15 ENCOUNTER — Ambulatory Visit: Payer: Managed Care, Other (non HMO) | Admitting: Adult Health

## 2020-05-15 VITALS — BP 132/85 | HR 83 | Temp 97.8°F | Resp 16 | Wt 135.6 lb

## 2020-05-15 DIAGNOSIS — R0602 Shortness of breath: Secondary | ICD-10-CM

## 2020-05-15 DIAGNOSIS — I10 Essential (primary) hypertension: Secondary | ICD-10-CM | POA: Diagnosis present

## 2020-05-15 DIAGNOSIS — M057A Rheumatoid arthritis with rheumatoid factor of other specified site without organ or systems involvement: Secondary | ICD-10-CM

## 2020-05-15 DIAGNOSIS — F17209 Nicotine dependence, unspecified, with unspecified nicotine-induced disorders: Secondary | ICD-10-CM

## 2020-05-15 MED ORDER — HYDROCHLOROTHIAZIDE 25 MG PO TABS: 25.0000 mg | ORAL_TABLET | Freq: Every day | ORAL | 1 refills | Status: DC

## 2020-05-15 NOTE — Patient Instructions (Signed)
Mediterranean Diet A Mediterranean diet refers to food and lifestyle choices that are based on the traditions of countries located on the [place]. This way of eating has been shown to help prevent certain conditions and improve outcomes for people who have chronic diseases, like kidney disease and heart disease. What are tips for following this plan? Lifestyle  Cook and eat meals together with your family, when possible.  Drink enough fluid to keep your urine clear or pale yellow.  Be physically active every day. This includes: ? Aerobic exercise like running or swimming. ? Leisure activities like gardening, walking, or housework.  Get 7-8 hours of sleep each night.  If recommended by your health care provider, drink red wine in moderation. This means 1 glass a day for nonpregnant women and 2 glasses a day for men. A glass of wine equals 5 oz (150 mL). Reading food labels  Check the serving size of packaged foods. For foods such as rice and pasta, the serving size refers to the amount of cooked product, not dry.  Check the total fat in packaged foods. Avoid foods that have saturated fat or trans fats.  Check the ingredients list for added sugars, such as corn syrup.   Shopping  At the grocery store, buy most of your food from the areas near the walls of the store. This includes: ? Fresh fruits and vegetables (produce). ? Grains, beans, nuts, and seeds. Some of these may be available in unpackaged forms or large amounts (in bulk). ? Fresh seafood. ? Poultry and eggs. ? Low-fat dairy products.  Buy whole ingredients instead of prepackaged foods.  Buy fresh fruits and vegetables in-season from local farmers markets.  Buy frozen fruits and vegetables in resealable bags.  If you do not have access to quality fresh seafood, buy precooked frozen shrimp or canned fish, such as tuna, salmon, or sardines.  Buy small amounts of raw or cooked vegetables, salads, or olives from  the deli or salad bar at your store.  Stock your pantry so you always have certain foods on hand, such as olive oil, canned tuna, canned tomatoes, rice, pasta, and beans. Cooking  Cook foods with extra-virgin olive oil instead of using butter or other vegetable oils.  Have meat as a side dish, and have vegetables or grains as your main dish. This means having meat in small portions or adding small amounts of meat to foods like pasta or stew.  Use beans or vegetables instead of meat in common dishes like chili or lasagna.  Experiment with different cooking methods. Try roasting or broiling vegetables instead of steaming or sauteing them.  Add frozen vegetables to soups, stews, pasta, or rice.  Add nuts or seeds for added healthy fat at each meal. You can add these to yogurt, salads, or vegetable dishes.  Marinate fish or vegetables using olive oil, lemon juice, garlic, and fresh herbs. Meal planning  Plan to eat 1 vegetarian meal one day each week. Try to work up to 2 vegetarian meals, if possible.  Eat seafood 2 or more times a week.  Have healthy snacks readily available, such as: ? Vegetable sticks with hummus. ? Greek yogurt. ? Fruit and nut trail mix.  Eat balanced meals throughout the week. This includes: ? Fruit: 2-3 servings a day ? Vegetables: 4-5 servings a day ? Low-fat dairy: 2 servings a day ? Fish, poultry, or lean meat: 1 serving a day ? Beans and legumes: 2 or more servings a week ?   Nuts and seeds: 1-2 servings a day ? Whole grains: 6-8 servings a day ? Extra-virgin olive oil: 3-4 servings a day  Limit red meat and sweets to only a few servings a month   What are my food choices?  Mediterranean diet ? Recommended  Grains: Whole-grain pasta. Brown rice. Bulgar wheat. Polenta. Couscous. Whole-wheat bread. Oatmeal. Quinoa.  Vegetables: Artichokes. Beets. Broccoli. Cabbage. Carrots. Eggplant. Green beans. Chard. Kale. Spinach. Onions. Leeks. Peas. Squash.  Tomatoes. Peppers. Radishes.  Fruits: Apples. Apricots. Avocado. Berries. Bananas. Cherries. Dates. Figs. Grapes. Lemons. Melon. Oranges. Peaches. Plums. Pomegranate.  Meats and other protein foods: Beans. Almonds. Sunflower seeds. Pine nuts. Peanuts. Cod. Salmon. Scallops. Shrimp. Tuna. Tilapia. Clams. Oysters. Eggs.  Dairy: Low-fat milk. Cheese. Greek yogurt.  Beverages: Water. Red wine. Herbal tea.  Fats and oils: Extra virgin olive oil. Avocado oil. Grape seed oil.  Sweets and desserts: Greek yogurt with honey. Baked apples. Poached pears. Trail mix.  Seasoning and other foods: Basil. Cilantro. Coriander. Cumin. Mint. Parsley. Sage. Rosemary. Tarragon. Garlic. Oregano. Thyme. Pepper. Balsalmic vinegar. Tahini. Hummus. Tomato sauce. Olives. Mushrooms. ? Limit these  Grains: Prepackaged pasta or rice dishes. Prepackaged cereal with added sugar.  Vegetables: Deep fried potatoes (french fries).  Fruits: Fruit canned in syrup.  Meats and other protein foods: Beef. Pork. Lamb. Poultry with skin. Hot dogs. Bacon.  Dairy: Ice cream. Sour cream. Whole milk.  Beverages: Juice. Sugar-sweetened soft drinks. Beer. Liquor and spirits.  Fats and oils: Butter. Canola oil. Vegetable oil. Beef fat (tallow). Lard.  Sweets and desserts: Cookies. Cakes. Pies. Candy.  Seasoning and other foods: Mayonnaise. Premade sauces and marinades. The items listed may not be a complete list. Talk with your dietitian about what dietary choices are right for you. Summary  The Mediterranean diet includes both food and lifestyle choices.  Eat a variety of fresh fruits and vegetables, beans, nuts, seeds, and whole grains.  Limit the amount of red meat and sweets that you eat.  Talk with your health care provider about whether it is safe for you to drink red wine in moderation. This means 1 glass a day for nonpregnant women and 2 glasses a day for men. A glass of wine equals 5 oz (150 mL). This information  is not intended to replace advice given to you by your health care provider. Make sure you discuss any questions you have with your health care provider. Document Revised: 12/06/2015 Document Reviewed: 11/29/2015 Elsevier Patient Education  2020 Elsevier Inc. Hypertension, Adult Hypertension is another name for high blood pressure. High blood pressure forces your heart to work harder to pump blood. This can cause problems over time. There are two numbers in a blood pressure reading. There is a top number (systolic) over a bottom number (diastolic). It is best to have a blood pressure that is below 120/80. Healthy choices can help lower your blood pressure, or you may need medicine to help lower it. What are the causes? The cause of this condition is not known. Some conditions may be related to high blood pressure. What increases the risk?  Smoking.  Having type 2 diabetes mellitus, high cholesterol, or both.  Not getting enough exercise or physical activity.  Being overweight.  Having too much fat, sugar, calories, or salt (sodium) in your diet.  Drinking too much alcohol.  Having long-term (chronic) kidney disease.  Having a family history of high blood pressure.  Age. Risk increases with age.  Race. You may be at higher risk if   you are African American.  Gender. Men are at higher risk than women before age 45. After age 65, women are at higher risk than men.  Having obstructive sleep apnea.  Stress. What are the signs or symptoms?  High blood pressure may not cause symptoms. Very high blood pressure (hypertensive crisis) may cause: ? Headache. ? Feelings of worry or nervousness (anxiety). ? Shortness of breath. ? Nosebleed. ? A feeling of being sick to your stomach (nausea). ? Throwing up (vomiting). ? Changes in how you see. ? Very bad chest pain. ? Seizures. How is this treated?  This condition is treated by making healthy lifestyle changes, such as: ? Eating  healthy foods. ? Exercising more. ? Drinking less alcohol.  Your health care provider may prescribe medicine if lifestyle changes are not enough to get your blood pressure under control, and if: ? Your top number is above 130. ? Your bottom number is above 80.  Your personal target blood pressure may vary. Follow these instructions at home: Eating and drinking  If told, follow the DASH eating plan. To follow this plan: ? Fill one half of your plate at each meal with fruits and vegetables. ? Fill one fourth of your plate at each meal with whole grains. Whole grains include whole-wheat pasta, brown rice, and whole-grain bread. ? Eat or drink low-fat dairy products, such as skim milk or low-fat yogurt. ? Fill one fourth of your plate at each meal with low-fat (lean) proteins. Low-fat proteins include fish, chicken without skin, eggs, beans, and tofu. ? Avoid fatty meat, cured and processed meat, or chicken with skin. ? Avoid pre-made or processed food.  Eat less than 1,500 mg of salt each day.  Do not drink alcohol if: ? Your doctor tells you not to drink. ? You are pregnant, may be pregnant, or are planning to become pregnant.  If you drink alcohol: ? Limit how much you use to:  0-1 drink a day for women.  0-2 drinks a day for men. ? Be aware of how much alcohol is in your drink. In the [place] one drink equals one 12 oz bottle of beer (355 mL), one 5 oz glass of wine (148 mL), or one 1 oz glass of hard liquor (44 mL).   Lifestyle  Work with your doctor to stay at a healthy weight or to lose weight. Ask your doctor what the best weight is for you.  Get at least 30 minutes of exercise most days of the week. This may include walking, swimming, or biking.  Get at least 30 minutes of exercise that strengthens your muscles (resistance exercise) at least 3 days a week. This may include lifting weights or doing Pilates.  Do not use any products that contain nicotine or tobacco, such as  cigarettes, e-cigarettes, and chewing tobacco. If you need help quitting, ask your doctor.  Check your blood pressure at home as told by your doctor.  Keep all follow-up visits as told by your doctor. This is important.   Medicines  Take over-the-counter and prescription medicines only as told by your doctor. Follow directions carefully.  Do not skip doses of blood pressure medicine. The medicine does not work as well if you skip doses. Skipping doses also puts you at risk for problems.  Ask your doctor about side effects or reactions to medicines that you should watch for. Contact a doctor if you:  Think you are having a reaction to the medicine you are taking.    Have headaches that keep coming back (recurring).  Feel dizzy.  Have swelling in your ankles.  Have trouble with your vision. Get help right away if you:  Get a very bad headache.  Start to feel mixed up (confused).  Feel weak or numb.  Feel faint.  Have very bad pain in your: ? Chest. ? Belly (abdomen).  Throw up more than once.  Have trouble breathing. Summary  Hypertension is another name for high blood pressure.  High blood pressure forces your heart to work harder to pump blood.  For most people, a normal blood pressure is less than 120/80.  Making healthy choices can help lower blood pressure. If your blood pressure does not get lower with healthy choices, you may need to take medicine. This information is not intended to replace advice given to you by your health care provider. Make sure you discuss any questions you have with your health care provider. Document Revised: 12/16/2017 Document Reviewed: 12/16/2017 Elsevier Patient Education  2021 Elsevier Inc.  

## 2020-05-15 NOTE — Progress Notes (Signed)
X ray shows mild pleural scarring, provider had pulmonary review given her shortness of breath,she does have over inflated lungs on x ray. CT of neck in 2007 showed some early emphysema, and recommend discontinuing smoking and referral to pulmonary Dr. Jayme Cloud for pulmonary function testing given her mild shortness of breath. Ok to refer if she agrees.

## 2020-05-15 NOTE — Telephone Encounter (Signed)
-----   Message from Berniece Pap, FNP sent at 05/15/2020  1:30 PM EST ----- X ray shows mild pleural scarring, provider had pulmonary review given her shortness of breath,she does have over inflated lungs on x ray. CT of neck in 2007 showed some early emphysema, and recommend discontinuing smoking and referral to pulmonary Dr. Jayme Cloud for pulmonary function testing given her mild shortness of breath. Ok to refer if she agrees.

## 2020-05-15 NOTE — Telephone Encounter (Signed)
Patient has reviewed over message. KW

## 2020-06-08 ENCOUNTER — Other Ambulatory Visit: Payer: Self-pay | Admitting: Adult Health

## 2020-06-18 ENCOUNTER — Other Ambulatory Visit: Payer: Self-pay | Admitting: Adult Health

## 2020-07-24 ENCOUNTER — Institutional Professional Consult (permissible substitution): Payer: Managed Care, Other (non HMO) | Admitting: Pulmonary Disease

## 2020-08-20 ENCOUNTER — Other Ambulatory Visit: Payer: Self-pay | Admitting: Adult Health

## 2020-08-21 ENCOUNTER — Ambulatory Visit: Payer: Self-pay | Admitting: Adult Health

## 2020-09-11 ENCOUNTER — Other Ambulatory Visit: Payer: Self-pay | Admitting: Adult Health

## 2020-09-12 ENCOUNTER — Encounter: Payer: Self-pay | Admitting: Adult Health

## 2020-09-19 ENCOUNTER — Other Ambulatory Visit: Payer: Self-pay | Admitting: Adult Health

## 2020-09-20 ENCOUNTER — Encounter: Payer: Managed Care, Other (non HMO) | Admitting: Adult Health

## 2020-10-15 ENCOUNTER — Encounter: Payer: Self-pay | Admitting: Adult Health

## 2020-10-18 ENCOUNTER — Encounter: Payer: Managed Care, Other (non HMO) | Admitting: Adult Health

## 2020-10-23 ENCOUNTER — Telehealth: Payer: Self-pay

## 2020-10-23 MED ORDER — LOSARTAN POTASSIUM 25 MG PO TABS: 25.0000 mg | ORAL_TABLET | Freq: Every day | ORAL | 0 refills | Status: DC

## 2020-10-23 NOTE — Telephone Encounter (Signed)
[facility] faxed refill request for the following medications:   losartan (COZAAR) 25 MG tablet   Please advise.  

## 2020-10-26 NOTE — Telephone Encounter (Signed)
PT called to request a refill of their losartan (COZAAR) 25 MG tablet

## 2020-11-16 ENCOUNTER — Other Ambulatory Visit: Payer: Self-pay | Admitting: Adult Health

## 2020-11-18 ENCOUNTER — Other Ambulatory Visit: Payer: Self-pay | Admitting: Family Medicine

## 2020-11-18 NOTE — Telephone Encounter (Signed)
Re routing to [facility].

## 2020-11-23 ENCOUNTER — Other Ambulatory Visit: Payer: Self-pay | Admitting: Adult Health

## 2020-12-09 ENCOUNTER — Emergency Department: Payer: Managed Care, Other (non HMO)

## 2020-12-09 ENCOUNTER — Inpatient Hospital Stay
Admission: EM | Admit: 2020-12-09 | Discharge: 2020-12-11 | DRG: 281 | Disposition: A | Discharge status: Home / Self Care | Payer: Managed Care, Other (non HMO) | Attending: Internal Medicine | Admitting: Internal Medicine

## 2020-12-09 ENCOUNTER — Other Ambulatory Visit: Payer: Self-pay

## 2020-12-09 DIAGNOSIS — E785 Hyperlipidemia, unspecified: Secondary | ICD-10-CM

## 2020-12-09 DIAGNOSIS — F32A Depression, unspecified: Secondary | ICD-10-CM

## 2020-12-09 DIAGNOSIS — Z79899 Other long term (current) drug therapy: Secondary | ICD-10-CM | POA: Diagnosis not present

## 2020-12-09 DIAGNOSIS — I214 Non-ST elevation (NSTEMI) myocardial infarction: Secondary | ICD-10-CM | POA: Diagnosis present

## 2020-12-09 DIAGNOSIS — Z8249 Family history of ischemic heart disease and other diseases of the circulatory system: Secondary | ICD-10-CM | POA: Diagnosis not present

## 2020-12-09 DIAGNOSIS — F17209 Nicotine dependence, unspecified, with unspecified nicotine-induced disorders: Secondary | ICD-10-CM | POA: Diagnosis present

## 2020-12-09 DIAGNOSIS — Z888 Allergy status to other drugs, medicaments and biological substances status: Secondary | ICD-10-CM

## 2020-12-09 DIAGNOSIS — I5181 Takotsubo syndrome: Secondary | ICD-10-CM

## 2020-12-09 DIAGNOSIS — Z833 Family history of diabetes mellitus: Secondary | ICD-10-CM

## 2020-12-09 DIAGNOSIS — I712 Thoracic aortic aneurysm, without rupture: Secondary | ICD-10-CM | POA: Diagnosis present

## 2020-12-09 DIAGNOSIS — F419 Anxiety disorder, unspecified: Secondary | ICD-10-CM | POA: Diagnosis present

## 2020-12-09 DIAGNOSIS — I10 Essential (primary) hypertension: Secondary | ICD-10-CM | POA: Diagnosis present

## 2020-12-09 DIAGNOSIS — Z83438 Family history of other disorder of lipoprotein metabolism and other lipidemia: Secondary | ICD-10-CM | POA: Diagnosis not present

## 2020-12-09 DIAGNOSIS — F17210 Nicotine dependence, cigarettes, uncomplicated: Secondary | ICD-10-CM | POA: Diagnosis present

## 2020-12-09 DIAGNOSIS — I77810 Thoracic aortic ectasia: Secondary | ICD-10-CM | POA: Diagnosis present

## 2020-12-09 DIAGNOSIS — M069 Rheumatoid arthritis, unspecified: Secondary | ICD-10-CM | POA: Diagnosis present

## 2020-12-09 DIAGNOSIS — R079 Chest pain, unspecified: Secondary | ICD-10-CM | POA: Diagnosis not present

## 2020-12-09 DIAGNOSIS — I2510 Atherosclerotic heart disease of native coronary artery without angina pectoris: Secondary | ICD-10-CM | POA: Diagnosis present

## 2020-12-09 DIAGNOSIS — Z20822 Contact with and (suspected) exposure to covid-19: Secondary | ICD-10-CM | POA: Diagnosis present

## 2020-12-09 DIAGNOSIS — R011 Cardiac murmur, unspecified: Secondary | ICD-10-CM | POA: Diagnosis present

## 2020-12-09 DIAGNOSIS — Z720 Tobacco use: Secondary | ICD-10-CM | POA: Diagnosis not present

## 2020-12-09 HISTORY — DX: Tobacco use: Z72.0

## 2020-12-09 LAB — HEPATIC FUNCTION PANEL
ALT: 13 U/L (ref 0–44)
AST: 24 U/L (ref 15–41)
Albumin: 3.6 g/dL (ref 3.5–5.0)
Alkaline Phosphatase: 72 U/L (ref 38–126)
Bilirubin, Direct: <0.1 mg/dL (ref 0.0–0.2)
Total Bilirubin: 0.6 mg/dL (ref 0.3–1.2)
Total Protein: 6.3 g/dL — ABNORMAL LOW (ref 6.5–8.1)

## 2020-12-09 LAB — BASIC METABOLIC PANEL
Anion gap: 8 (ref 5–15)
BUN: 11 mg/dL (ref 6–20)
CO2: 26 mmol/L (ref 22–32)
Calcium: 8.7 mg/dL — ABNORMAL LOW (ref 8.9–10.3)
Chloride: 105 mmol/L (ref 98–111)
Creatinine, Ser: 0.77 mg/dL (ref 0.44–1.00)
GFR, Estimated: >60 mL/min (ref >60)
Glucose, Bld: 138 mg/dL — ABNORMAL HIGH (ref 70–99)
Potassium: 3.5 mmol/L (ref 3.5–5.1)
Sodium: 139 mmol/L (ref 135–145)

## 2020-12-09 LAB — CBC
HCT: 40.4 % (ref 36.0–46.0)
Hemoglobin: 14.2 g/dL (ref 12.0–15.0)
MCH: 34 pg (ref 26.0–34.0)
MCHC: 35.1 g/dL (ref 30.0–36.0)
MCV: 96.7 fL (ref 80.0–100.0)
Platelets: 266 10*3/uL (ref 150–400)
RBC: 4.18 MIL/uL (ref 3.87–5.11)
RDW: 12.8 % (ref 11.5–15.5)
WBC: 5.9 10*3/uL (ref 4.0–10.5)
nRBC: 0 % (ref 0.0–0.2)

## 2020-12-09 LAB — PROTIME-INR
INR: 1 (ref 0.8–1.2)
Prothrombin Time: 13.4 seconds (ref 11.4–15.2)

## 2020-12-09 LAB — TROPONIN I (HIGH SENSITIVITY)
Troponin I (High Sensitivity): 514 ng/L (ref <18)
Troponin I (High Sensitivity): 3284 ng/L (ref <18)

## 2020-12-09 LAB — TSH: TSH: 1.159 u[IU]/mL (ref 0.350–4.500)

## 2020-12-09 LAB — T4, FREE: Free T4: 0.82 ng/dL (ref 0.61–1.12)

## 2020-12-09 LAB — RESP PANEL BY RT-PCR (FLU A&B, COVID) ARPGX2
Influenza A by PCR: NEGATIVE
Influenza B by PCR: NEGATIVE
SARS Coronavirus 2 by RT PCR: NEGATIVE

## 2020-12-09 LAB — HCG, QUANTITATIVE, PREGNANCY: hCG, Beta Chain, Quant, S: 5 m[IU]/mL — ABNORMAL HIGH (ref <5)

## 2020-12-09 LAB — APTT: aPTT: 28 seconds (ref 24–36)

## 2020-12-09 MED ORDER — HEPARIN BOLUS VIA INFUSION
3700.0000 [IU] | Freq: Once | INTRAVENOUS | Status: AC
  Administered 2020-12-09: 3700 [IU] via INTRAVENOUS
  Filled 2020-12-09: qty 3700

## 2020-12-09 MED ORDER — HYDRALAZINE HCL 20 MG/ML IJ SOLN
10.0000 mg | Freq: Four times a day (QID) | INTRAMUSCULAR | Status: DC | PRN
  Administered 2020-12-09: 10 mg via INTRAVENOUS
  Filled 2020-12-09: qty 1

## 2020-12-09 MED ORDER — ASPIRIN EC 81 MG PO TBEC
81.0000 mg | DELAYED_RELEASE_TABLET | Freq: Every day | ORAL | Status: DC
  Administered 2020-12-10 – 2020-12-11 (×2): 81 mg via ORAL
  Filled 2020-12-09 (×2): qty 1

## 2020-12-09 MED ORDER — ACETAMINOPHEN 325 MG PO TABS
650.0000 mg | ORAL_TABLET | ORAL | Status: DC | PRN
Start: 2020-12-09 — End: 2020-12-11
  Administered 2020-12-10: 650 mg via ORAL
  Filled 2020-12-09: qty 2

## 2020-12-09 MED ORDER — IOHEXOL 350 MG/ML SOLN
75.0000 mL | Freq: Once | INTRAVENOUS | Status: AC | PRN
  Administered 2020-12-09: 75 mL via INTRAVENOUS

## 2020-12-09 MED ORDER — SODIUM CHLORIDE 0.9 % IV BOLUS
500.0000 mL | Freq: Once | INTRAVENOUS | Status: AC
  Administered 2020-12-09: 500 mL via INTRAVENOUS

## 2020-12-09 MED ORDER — SODIUM CHLORIDE 0.9 % IV SOLN: INTRAVENOUS | Status: DC

## 2020-12-09 MED ORDER — MORPHINE SULFATE (PF) 4 MG/ML IV SOLN
4.0000 mg | Freq: Once | INTRAVENOUS | Status: AC
  Administered 2020-12-09: 4 mg via INTRAVENOUS
  Filled 2020-12-09: qty 1

## 2020-12-09 MED ORDER — ONDANSETRON HCL 4 MG/2ML IJ SOLN
4.0000 mg | Freq: Once | INTRAMUSCULAR | Status: AC
  Administered 2020-12-09: 4 mg via INTRAVENOUS
  Filled 2020-12-09: qty 2

## 2020-12-09 MED ORDER — NITROGLYCERIN 0.4 MG SL SUBL: 0.4000 mg | SUBLINGUAL_TABLET | SUBLINGUAL | Status: DC | PRN

## 2020-12-09 MED ORDER — ATORVASTATIN CALCIUM 20 MG PO TABS
80.0000 mg | ORAL_TABLET | Freq: Every day | ORAL | Status: DC
  Administered 2020-12-09 – 2020-12-10 (×2): 80 mg via ORAL
  Filled 2020-12-09: qty 1
  Filled 2020-12-09: qty 4

## 2020-12-09 MED ORDER — SODIUM CHLORIDE 0.9 % IV BOLUS: 1000.0000 mL | Freq: Once | INTRAVENOUS | Status: DC

## 2020-12-09 MED ORDER — IOHEXOL 350 MG/ML SOLN
100.0000 mL | Freq: Once | INTRAVENOUS | Status: AC | PRN
  Administered 2020-12-09: 80 mL via INTRAVENOUS

## 2020-12-09 MED ORDER — HEPARIN (PORCINE) 25000 UT/250ML-% IV SOLN
1050.0000 [IU]/h | INTRAVENOUS | Status: DC
  Administered 2020-12-09: 750 [IU]/h via INTRAVENOUS
  Filled 2020-12-09 (×2): qty 250

## 2020-12-09 MED ORDER — ONDANSETRON HCL 4 MG/2ML IJ SOLN: 4.0000 mg | Freq: Four times a day (QID) | INTRAMUSCULAR | Status: DC | PRN

## 2020-12-09 NOTE — Consult Note (Signed)
ANTICOAGULATION CONSULT NOTE - Initial Consult  Pharmacy Consult for heparin Indication: chest pain/ACS  Allergies  Allergen Reactions   Enbrel [Etanercept] Swelling   Humira [Adalimumab] Other (See Comments)    kidney dysfunction    Patient Measurements: Height: 5\' 6"  (167.6 cm) Weight: 61.2 kg (135 lb) IBW/kg (Calculated) : 59.3 Heparin Dosing Weight: 61.2kg  Vital Signs: Temp: 98.6 F (37 C) (08/21 1225) Temp Source: Oral (08/21 1225) BP: 133/105 (08/21 1539) Pulse Rate: 87 (08/21 1539)  Labs: Recent Labs    12/09/20 1227 12/09/20 1427 12/09/20 1451  HGB 14.2  --   --   HCT 40.4  --   --   PLT 266  --   --   APTT  --   --  28  LABPROT  --   --  13.4  INR  --   --  1.0  CREATININE 0.77  --   --   TROPONINIHS 514* 3,284*  --     Estimated Creatinine Clearance: 80.5 mL/min (by C-G formula based on SCr of 0.77 mg/dL).   Medical History: Past Medical History:  Diagnosis Date   Anxiety    Extensor tendon rupture, non-traumatic, hand and wrist    right   Heart murmur    History of kidney stones    Hypertension    Rheumatoid arthritis (HCC) dx 2006   BILATERAL HANDS AND RIGHT ANKLE    Medications:  Scheduled:   Assessment: Pt presented with chest pain with ACS/PE as likely diagnosis. CT PE was negative for pulmonary embolism. Normal sinus, no st elevation, no twi, normal intervals, but troponin has elevated 514>3284. Pharmacy has been consulted for heparin dosing and monitoring.   No anticoagulants were noted on PTA med list.   Baseline aPTT, INR, and CBC were within normal limits.    Goal of Therapy:  Heparin level 0.3-0.7 units/ml Monitor platelets by anticoagulation protocol: Yes   Plan:  Give 3700 units bolus x 1 Start heparin infusion at 750 units/hr Check anti-Xa level in 6 hours and daily while on heparin Continue to monitor H&H and platelets  2007, PharmD Pharmacy Resident  12/09/2020 5:21 PM

## 2020-12-09 NOTE — ED Notes (Signed)
Pt lying in bed with daughter at bedside. Pt states that she feels much better after the pain medication and the pain in her jaw is now at a 1 on pain scale. Pt vitals stable, pt in no obvious distress.

## 2020-12-09 NOTE — ED Triage Notes (Signed)
[place] EMS from [place] for CP that radiates up to neck. EMS Administered 324 aspirin. A&O x4

## 2020-12-09 NOTE — H&P (Signed)
History and Physical    Isabella Burch NWG:[id number] DOB: Oct 06, 1970 DOA: 12/09/2020  PCP: Berniece Pap, FNP    Patient coming from:  Home    Chief Complaint:  Chest pain    HPI: Isabella Burch is a 50 y.o. female seen in ed with complaints of chest pain. Patient is a 50 year old Caucasian female with past medical history of hypertension and rheumatoid arthritis coming to [place] today with complaints of chest pain that started about 11:00 in church today.  Patient sat down did not feel well and reported chest pain going up into her jaw along with shortness of breath.  Pt has past medical history of anxiety, hypertension, rheumatoid arthritis.  ED Course:  Vitals:   12/09/20 1225 12/09/20 1539  BP: (!) 135/100 (!) 133/105  Pulse: 68 87  Resp: 18 18  Temp: 98.6 F (37 C)   TempSrc: Oral   SpO2: 100% 99%  Weight: 61.2 kg   Height: 5\' 6"  (1.676 m)   In the emergency room patient is alert awake afebrile oxygenating normal on room air. Blood pressure is stable at 135/100 BMP shows elevated glucose of 138 otherwise normal creatinine, troponin initially has gone from 514 2 3284, normal CBC with a white count of 5.9 hemoglobin 14.2, CT angio chest PE protocol is negative negative for PE but has shown an ascending aortic aneurysm without dissection at 4 cm in diameter,.  The emergency room patient was started on heparin drip.  I have requested ED provider consult to vascular and CT surgeon and also consult cardiology.   Review of Systems:  Review of Systems  Cardiovascular:  Positive for chest pain.  All other systems reviewed and are negative.   Past Medical History:  Diagnosis Date   Anxiety    Extensor tendon rupture, non-traumatic, hand and wrist    right   Heart murmur    History of kidney stones    Hypertension    Rheumatoid arthritis (HCC) dx 2006   BILATERAL HANDS AND RIGHT ANKLE    Past Surgical History:  Procedure Laterality Date   CARDIAC VALVE REPLACEMENT      CYSTOSCOPY WITH URETEROSCOPY, STONE BASKETRY AND STENT PLACEMENT  2011   LEFT WRIST EXTENSOR TENDON REPAIR     TENDON TRANSFER Right 11/09/2014   Procedure: RIGHT THUMB EIP TO EPL TENDON TRANSFER;  Surgeon: 11/11/2014, MD;  Location: [facility];  Service: Orthopedics;  Laterality: Right;   TUBAL LIGATION       reports that she has been smoking cigarettes. She has a 12.50 pack-year smoking history. She has never used smokeless tobacco. She reports that she does not drink alcohol and does not use drugs.  Allergies  Allergen Reactions   Enbrel [Etanercept] Swelling   Humira [Adalimumab] Other (See Comments)    kidney dysfunction    Family History  Problem Relation Age of Onset   Hyperlipidemia Father    Hypertension Father    Diabetes Father    Hypothyroidism Mother     Prior to Admission medications   Medication Sig Start Date End Date Taking? Authorizing Provider  DULoxetine (CYMBALTA) 30 MG capsule TAKE 1 CAPSULE BY MOUTH EVERY DAY 05/08/20   Flinchum, 05/10/20, FNP  fluticasone (FLONASE) 50 MCG/ACT nasal spray SPRAY 2 SPRAYS INTO EACH NOSTRIL EVERY DAY 07/07/19   Flinchum, 07/09/19, FNP  hydrochlorothiazide (HYDRODIURIL) 25 MG tablet Take 1 tablet (25 mg total) by mouth daily. 05/15/20   Flinchum, 05/17/20, FNP  leflunomide (ARAVA) 10 MG tablet TAKE 1 TABLET BY MOUTH ONCE A DAY 07/17/14   [provider]  losartan (COZAAR) 25 MG tablet TAKE 1 TABLET (25 MG TOTAL) BY MOUTH DAILY. PLEASE SCHEDULE OFFICE VISIT BEFORE ANY FUTURE REFILLS. 11/20/20   Eulis Foster, FNP  meclizine (ANTIVERT) 25 MG tablet Take 1 tablet (25 mg total) by mouth 3 (three) times daily as needed for dizziness. 04/27/20   Concha Se, MD  NEOMYCIN-POLYMYXIN-HYDROCORTISONE (CORTISPORIN) 1 % SOLN OTIC solution Place 3 drops into the right ear every 6 (six) hours. 06/14/19   Flinchum, Eula Fried, FNP  ondansetron (ZOFRAN ODT) 4 MG disintegrating tablet Take 1 tablet (4 mg total) by  mouth every 8 (eight) hours as needed for nausea or vomiting. 04/27/20   Concha Se, MD  ORENCIA CLICKJECT 125 MG/ML SOAJ Inject 1 Syringe into the skin once a week. 05/30/19   [provider]  tiZANidine (ZANAFLEX) 4 MG tablet Take by mouth. Patient not taking: Reported on 05/15/2020 10/05/18   [provider]  VITAMIN D PO Take by mouth. 4000IU    [provider]    Physical Exam: Vitals:   12/09/20 1225 12/09/20 1539  BP: (!) 135/100 (!) 133/105  Pulse: 68 87  Resp: 18 18  Temp: 98.6 F (37 C)   TempSrc: Oral   SpO2: 100% 99%  Weight: 61.2 kg   Height: 5\' 6"  (1.676 m)    Physical Exam Vitals reviewed.  Constitutional:      General: She is not in acute distress.    Appearance: Normal appearance. She is not ill-appearing.  HENT:     Head: Normocephalic and atraumatic.     Right Ear: External ear normal.     Left Ear: External ear normal.     Nose: Nose normal.     Mouth/Throat:     Mouth: Mucous membranes are moist.  Eyes:     Extraocular Movements: Extraocular movements intact.     Pupils: Pupils are equal, round, and reactive to light.  Neck:     Vascular: No carotid bruit.  Cardiovascular:     Rate and Rhythm: Normal rate and regular rhythm.     Pulses: Normal pulses.     Heart sounds: Normal heart sounds.  Pulmonary:     Effort: Pulmonary effort is normal.     Breath sounds: Normal breath sounds.  Abdominal:     General: Bowel sounds are normal. There is no distension.     Palpations: Abdomen is soft. There is no mass.     Tenderness: There is no abdominal tenderness. There is no guarding.     Hernia: No hernia is present.  Musculoskeletal:     Right lower leg: No edema.     Left lower leg: No edema.  Skin:    General: Skin is warm.  Neurological:     General: No focal deficit present.     Mental Status: She is alert and oriented to person, place, and time.  Psychiatric:        Mood and Affect: Mood normal.        Behavior:  Behavior normal.     Labs on Admission: I have personally reviewed following labs and imaging studies  No results for input(s): CKTOTAL, CKMB, TROPONINI in the last 72 hours. Lab Results  Component Value Date   WBC 5.9 12/09/2020   HGB 14.2 12/09/2020   HCT 40.4 12/09/2020   MCV 96.7 12/09/2020   PLT 266  12/09/2020    Recent Labs  Lab 12/09/20 1227  NA 139  K 3.5  CL 105  CO2 26  BUN 11  CREATININE 0.77  CALCIUM 8.7*  GLUCOSE 138*   Lab Results  Component Value Date   CHOL 182 04/05/2019   HDL 53 04/05/2019   LDLCALC 113 (H) 04/05/2019   TRIG 86 04/05/2019   COVID-19 Labs Lab Results  Component Value Date   SARSCOV2NAA NEGATIVE 12/09/2020    Radiological Exams on Admission:   EKG: Independently reviewed.  NSR 74, no q waves.    Assessment/Plan Principal Problem:   Chest pain Active Problems:   Hypertension   Rheumatoid arthritis (HCC)   Tobacco use disorder, continuous   Ascending aortic aneurysm (HCC)   NSTEMI (non-ST elevated myocardial infarction) (HCC) Chest pain/ NSEMI: Will admit patient to progressive cardiac unit with continuous cardiac monitoring for chest pain. Discussed case with Dr. Mariah Milling cardiology on-call who anticipates the patient getting cardiac catheterization tomorrow and will make patient n.p.o. after midnight. .  Nitroglycerin, morphine, clear liquid diet until midnight. Continue patient on heparin drip  Hypertension: ...Blood pressure (!) 133/105, pulse 87, temperature 98.6 F (37 C), temperature source Oral, resp. rate 18, height 5\' 6"  (1.676 m), weight 61.2 kg, SpO2 99 %. We will continue patient on aspirin 81, start atorvastatin 80, patient started on heparin drip, hydralazine 4.  Hypertension, Zofran for nausea.  Rheumatoid arthritis:  Patient continue her orencia.   Tobacco abuse: Discussed with patient about tobacco cessation worsening of aneurysm tobacco use. Patient verbalized understanding and states that she is  going to stop smoking.  Ascending aortic aneurysm, Incidental finding on CT angio of the chest.  Informed patient about finding.     DVT prophylaxis:  Heparin Drip    Code Status:  Full code   Family Communication:  (Mother)  [phone] (Work Phone)   Disposition Plan:  Home    Consults called:  Cardiology -Dr.Golan.  Admission status: Inpatient.     [phone] MD [facility] [phone] How to contact the [facility] Attending or Consulting provider 7A - 7P or covering provider during after hours 7P -7A, for this patient.    Check the care team in [facility] and look for a) attending/consulting [facility] provider listed and b) the [facility] team listed Log into [email or web address] and use [facility] universal password to access. If you do not have the password, please contact the hospital operator. Locate the [facility] provider you are looking for under [facility] and page to a number that you can be directly reached. If you still have difficulty reaching the provider, please page the [facility] (Director on Call) for the Hospitalists listed on amion for assistance. [email or web address] Password [facility] 12/09/2020, 6:27 PM

## 2020-12-09 NOTE — ED Provider Notes (Signed)
Emergency department handoff note  Care of this patient was signed out to me at the end of the previous provider shift.  All pertinent patient information was conveyed and all questions were answered.  Patient pending CT angiography of the aorta that showed a ascending thoracic aortic aneurysm at 4 cm diameter.  I spoke to Dr. Imogene Burn and vascular surgery who recommended talking to cardiothoracic surgery however stated that there is nothing to be done given no dissection as well as very low risk for this being the reason for her troponin elevations.  Therefore will treat as an NSTEMI at this time and start patient on heparin drip.  I spoke to the on-call hospitalist who agreed to accept this patient onto their service for further evaluation and management.   Merwyn Katos, MD 12/09/20 [phone]

## 2020-12-09 NOTE — ED Triage Notes (Signed)
[place]- pt states she had gotten excited and then felt a pressure in her chest that radiates up into the R side of her neck and jaw- pt has 18G LAC- pt denies n/v- pt denies cardiac hx

## 2020-12-09 NOTE — ED Notes (Signed)
Isabella Burch [phone]  ** call in am to notify family of pt location **

## 2020-12-09 NOTE — ED Notes (Signed)
Care transferred, report received from [place] , [place]

## 2020-12-09 NOTE — ED Notes (Signed)
Patient provided broth per clear liquid diet. VSS, family at the bedside

## 2020-12-09 NOTE — ED Provider Notes (Addendum)
[facility] Emergency Department Provider Note  ____________________________________________   Event Date/Time   First MD Initiated Contact with Patient 12/09/20 1322     (approximate)  I have reviewed the triage vital signs and the nursing notes.   HISTORY  Chief Complaint Chest Pain     HPI Isabella Burch is a 50 y.o. female with HTN,, Rheu. Arthritis, anxiety who comes in with chest pain. The chest pain started in church today around 11AM.  She reports she was singing and then she sat down and didn't feel well and started having increasing of chest pain up into her jaw.  Pt reports difficulty breathing as well.  No h/o of blood clots.               Past Medical History:  Diagnosis Date   Anxiety    Extensor tendon rupture, non-traumatic, hand and wrist    right   Heart murmur    History of kidney stones    Hypertension    Rheumatoid arthritis (HCC) dx 2006   BILATERAL HANDS AND RIGHT ANKLE    Patient Active Problem List   Diagnosis Date Noted   Shortness of breath- occasional 05/15/2020   Vitamin D deficiency 02/07/2020   Anxiety 06/14/2019   Acute otitis externa of left ear 06/14/2019   Eustachian tube dysfunction, bilateral 06/14/2019   Low serum vitamin D 06/14/2019   Tobacco use disorder, continuous 06/14/2019   Rheumatoid arthritis (HCC) 11/22/2015   Chest pain 11/22/2015   Panic attacks 12/04/2014   Hypertension 10/08/2014   Screening mammogram for breast cancer 02/25/2013    Past Surgical History:  Procedure Laterality Date   CARDIAC VALVE REPLACEMENT     CYSTOSCOPY WITH URETEROSCOPY, STONE BASKETRY AND STENT PLACEMENT  2011   LEFT WRIST EXTENSOR TENDON REPAIR     TENDON TRANSFER Right 11/09/2014   Procedure: RIGHT THUMB EIP TO EPL TENDON TRANSFER;  Surgeon: Bradly Bienenstock, MD;  Location: [facility];  Service: Orthopedics;  Laterality: Right;   TUBAL LIGATION      Prior to Admission medications    Medication Sig Start Date End Date Taking? Authorizing Provider  DULoxetine (CYMBALTA) 30 MG capsule TAKE 1 CAPSULE BY MOUTH EVERY DAY 05/08/20   Flinchum, Eula Fried, FNP  fluticasone (FLONASE) 50 MCG/ACT nasal spray SPRAY 2 SPRAYS INTO EACH NOSTRIL EVERY DAY 07/07/19   Flinchum, Eula Fried, FNP  hydrochlorothiazide (HYDRODIURIL) 25 MG tablet Take 1 tablet (25 mg total) by mouth daily. 05/15/20   Flinchum, Eula Fried, FNP  leflunomide (ARAVA) 10 MG tablet TAKE 1 TABLET BY MOUTH ONCE A DAY 07/17/14   [provider]  losartan (COZAAR) 25 MG tablet TAKE 1 TABLET (25 MG TOTAL) BY MOUTH DAILY. PLEASE SCHEDULE OFFICE VISIT BEFORE ANY FUTURE REFILLS. 11/20/20   Eulis Foster, FNP  meclizine (ANTIVERT) 25 MG tablet Take 1 tablet (25 mg total) by mouth 3 (three) times daily as needed for dizziness. 04/27/20   Concha Se, MD  NEOMYCIN-POLYMYXIN-HYDROCORTISONE (CORTISPORIN) 1 % SOLN OTIC solution Place 3 drops into the right ear every 6 (six) hours. 06/14/19   Flinchum, Eula Fried, FNP  ondansetron (ZOFRAN ODT) 4 MG disintegrating tablet Take 1 tablet (4 mg total) by mouth every 8 (eight) hours as needed for nausea or vomiting. 04/27/20   Concha Se, MD  ORENCIA CLICKJECT 125 MG/ML SOAJ Inject 1 Syringe into the skin once a week. 05/30/19   [provider]  tiZANidine (ZANAFLEX) 4 MG  tablet Take by mouth. Patient not taking: Reported on 05/15/2020 10/05/18   [provider]  VITAMIN D PO Take by mouth. 4000IU    [provider]    Allergies Enbrel [etanercept] and Humira [adalimumab]  Family History  Problem Relation Age of Onset   Hyperlipidemia Father    Hypertension Father    Diabetes Father    Hypothyroidism Mother     Social History Social History   Tobacco Use   Smoking status: Every Day    Packs/day: 0.50    Years: 25.00    Pack years: 12.50    Types: Cigarettes   Smokeless tobacco: Never  Substance Use Topics   Alcohol use: No    Alcohol/week: 0.0  standard drinks   Drug use: No      Review of Systems Constitutional: No fever/chills Eyes: No visual changes. ENT: No sore throat. Cardiovascular: Positive chest pain Respiratory: Denies shortness of breath. Gastrointestinal: No abdominal pain.  No nausea, no vomiting.  No diarrhea.  No constipation. Genitourinary: Negative for dysuria. Musculoskeletal: Negative for back pain. Skin: Negative for rash. Neurological: Negative for headaches, focal weakness or numbness. All other ROS negative ____________________________________________   PHYSICAL EXAM:  VITAL SIGNS: ED Triage Vitals  Enc Vitals Group     BP 12/09/20 1225 (!) 135/100     Pulse Rate 12/09/20 1225 68     Resp 12/09/20 1225 18     Temp 12/09/20 1225 98.6 F (37 C)     Temp Source 12/09/20 1225 Oral     SpO2 12/09/20 1225 100 %     Weight 12/09/20 1225 135 lb (61.2 kg)     Height 12/09/20 1225 5\' 6"  (1.676 m)     Head Circumference --      Peak Flow --      Pain Score 12/09/20 1224 4     Pain Loc --      Pain Edu? --      Excl. in GC? --     Constitutional: Alert and oriented.  Appears uncomfortable Eyes: Conjunctivae are normal. EOMI. Head: Atraumatic. Nose: No congestion/rhinnorhea. Mouth/Throat: Mucous membranes are moist.   Neck: No stridor. Trachea Midline. FROM Cardiovascular: Normal rate, regular rhythm. Grossly normal heart sounds.  Good peripheral circulation. Respiratory: Normal respiratory effort.  No retractions. Lungs CTAB. Gastrointestinal: Soft and nontender. No distention. No abdominal bruits.  Musculoskeletal: No lower extremity tenderness nor edema.  No joint effusions. Neurologic:  Normal speech and language. No gross focal neurologic deficits are appreciated.  Skin:  Skin is warm, dry and intact. No rash noted. Psychiatric: Mood and affect are normal. Speech and behavior are normal. GU: Deferred   ____________________________________________   LABS (all labs ordered are  listed, but only abnormal results are displayed)  Labs Reviewed  BASIC METABOLIC PANEL - Abnormal; Notable for the following components:      Result Value   Glucose, Bld 138 (*)    Calcium 8.7 (*)    All other components within normal limits  TROPONIN I (HIGH SENSITIVITY) - Abnormal; Notable for the following components:   Troponin I (High Sensitivity) 514 (*)    All other components within normal limits  CBC  POC URINE PREG, ED   ____________________________________________   ED ECG REPORT I, Concha Se, the attending physician, personally viewed and interpreted this ECG.   Normal sinus, no st elevation, no twi, normal intervals  ____________________________________________  RADIOLOGY Vela Prose, personally viewed and evaluated these images (plain  radiographs) as part of my medical decision making, as well as reviewing the written report by the radiologist.  ED MD interpretation:  chest xray negative   Official radiology report(s): No results found.  ____________________________________________   PROCEDURES  Procedure(s) performed (including Critical Care):  .1-3 Lead EKG Interpretation  Date/Time: 12/09/2020 2:26 PM Performed by: Concha Se, MD Authorized by: Concha Se, MD     Interpretation: normal     ECG rate:  60s   ECG rate assessment: normal     Rhythm: sinus rhythm     Ectopy: none     Conduction: normal   .Critical Care  Date/Time: 12/10/2020 3:40 PM Performed by: Concha Se, MD Authorized by: Concha Se, MD   Critical care provider statement:    Critical care time (minutes):  45   Critical care was necessary to treat or prevent imminent or life-threatening deterioration of the following conditions:  Circulatory failure   Critical care was time spent personally by me on the following activities:  Discussions with consultants, evaluation of patient's response to treatment, examination of patient, ordering and performing  treatments and interventions, ordering and review of laboratory studies, ordering and review of radiographic studies, pulse oximetry, re-evaluation of patient's condition, obtaining history from patient or surrogate and review of old charts   ____________________________________________   INITIAL IMPRESSION / ASSESSMENT AND PLAN / ED COURSE   Isabella Burch was evaluated in Emergency Department on 12/09/2020 for the symptoms described in the history of present illness. She was evaluated in the context of the global COVID-19 pandemic, which necessitated consideration that the patient might be at risk for infection with the SARS-CoV-2 virus that causes COVID-19. Institutional protocols and algorithms that pertain to the evaluation of patients at risk for COVID-19 are in a state of rapid change based on information released by regulatory bodies including the CDC and federal and state organizations. These policies and algorithms were followed during the patient's care in the ED.    Most Likely DDx:  -ACS vs pe    DDx that was also considered d/t potential to cause harm, but was found less likely based on history and physical (as detailed above): -PNA (no fevers, cough but CXR to evaluate) -PNX (reassured with equal b/l breath sounds, CXR to evaluate) -Symptomatic anemia (will get H&H) -Pericarditis no rub on exam, EKG changes or hx to suggest dx -Tamponade (no notable SOB, tachycardic, hypotensive) -Esophageal rupture (no h/o diffuse vomitting/no crepitus)   CT PE was negative for pulmonary embolism however they stated that her aorta was dilated they got no contrast in the order to see if there was a dissection.  They recommended a repeat CT dissection.  Patient was given a little bit of fluids to help with contrast load.  Patient was given some IV morphine and Zofran to help with pain and discomfort.  Patient be kept on the cardiac monitor to evaluate for decompensation.  Handed off to  oncoming team pending CT imaging and admission        ____________________________________________   FINAL CLINICAL IMPRESSION(S) / ED DIAGNOSES   Final diagnoses:  NSTEMI (non-ST elevated myocardial infarction) (HCC)     MEDICATIONS GIVEN DURING THIS VISIT:  Medications - No data to display   ED Discharge Orders     None        Note:  This document was prepared using Dragon voice recognition software and may include unintentional dictation errors.    Concha Se,  MD 12/09/20 1516    Concha Se, MD 12/10/20 1540

## 2020-12-10 ENCOUNTER — Encounter: Payer: Self-pay | Admitting: Internal Medicine

## 2020-12-10 ENCOUNTER — Inpatient Hospital Stay (HOSPITAL_COMMUNITY)
Admit: 2020-12-10 | Discharge: 2020-12-10 | Disposition: A | Discharge status: Home / Self Care | Payer: Managed Care, Other (non HMO) | Attending: Internal Medicine | Admitting: Internal Medicine

## 2020-12-10 ENCOUNTER — Encounter
Admission: EM | Disposition: A | Discharge status: Home / Self Care | Payer: Self-pay | Source: Home / Self Care | Attending: Internal Medicine

## 2020-12-10 DIAGNOSIS — Z720 Tobacco use: Secondary | ICD-10-CM | POA: Diagnosis not present

## 2020-12-10 DIAGNOSIS — I214 Non-ST elevation (NSTEMI) myocardial infarction: Principal | ICD-10-CM

## 2020-12-10 DIAGNOSIS — I219 Acute myocardial infarction, unspecified: Secondary | ICD-10-CM

## 2020-12-10 DIAGNOSIS — R079 Chest pain, unspecified: Secondary | ICD-10-CM

## 2020-12-10 DIAGNOSIS — E785 Hyperlipidemia, unspecified: Secondary | ICD-10-CM

## 2020-12-10 DIAGNOSIS — I10 Essential (primary) hypertension: Secondary | ICD-10-CM

## 2020-12-10 DIAGNOSIS — F419 Anxiety disorder, unspecified: Secondary | ICD-10-CM

## 2020-12-10 HISTORY — DX: Acute myocardial infarction, unspecified: I21.9

## 2020-12-10 HISTORY — PX: LEFT HEART CATH AND CORONARY ANGIOGRAPHY: CATH118249

## 2020-12-10 LAB — TROPONIN I (HIGH SENSITIVITY)
Troponin I (High Sensitivity): 3128 ng/L (ref <18)
Troponin I (High Sensitivity): 2791 ng/L (ref <18)

## 2020-12-10 LAB — MAGNESIUM: Magnesium: 2 mg/dL (ref 1.7–2.4)

## 2020-12-10 LAB — COMPREHENSIVE METABOLIC PANEL
ALT: 13 U/L (ref 0–44)
AST: 36 U/L (ref 15–41)
Albumin: 3.4 g/dL — ABNORMAL LOW (ref 3.5–5.0)
Alkaline Phosphatase: 72 U/L (ref 38–126)
Anion gap: 6 (ref 5–15)
BUN: 11 mg/dL (ref 6–20)
CO2: 28 mmol/L (ref 22–32)
Calcium: 8.8 mg/dL — ABNORMAL LOW (ref 8.9–10.3)
Chloride: 105 mmol/L (ref 98–111)
Creatinine, Ser: 0.87 mg/dL (ref 0.44–1.00)
GFR, Estimated: >60 mL/min (ref >60)
Glucose, Bld: 115 mg/dL — ABNORMAL HIGH (ref 70–99)
Potassium: 4.7 mmol/L (ref 3.5–5.1)
Sodium: 139 mmol/L (ref 135–145)
Total Bilirubin: 1 mg/dL (ref 0.3–1.2)
Total Protein: 6.2 g/dL — ABNORMAL LOW (ref 6.5–8.1)

## 2020-12-10 LAB — CBC
HCT: 40.2 % (ref 36.0–46.0)
Hemoglobin: 13.3 g/dL (ref 12.0–15.0)
MCH: 31.7 pg (ref 26.0–34.0)
MCHC: 33.1 g/dL (ref 30.0–36.0)
MCV: 95.9 fL (ref 80.0–100.0)
Platelets: 281 10*3/uL (ref 150–400)
RBC: 4.19 MIL/uL (ref 3.87–5.11)
RDW: 13.2 % (ref 11.5–15.5)
WBC: 7.3 10*3/uL (ref 4.0–10.5)
nRBC: 0 % (ref 0.0–0.2)

## 2020-12-10 LAB — HEMOGLOBIN A1C
Hgb A1c MFr Bld: 5.7 % — ABNORMAL HIGH (ref 4.8–5.6)
Mean Plasma Glucose: 116.89 mg/dL

## 2020-12-10 LAB — ECHOCARDIOGRAM COMPLETE
AR max vel: 1.86 cm2
AV Area VTI: 2 cm2
AV Area mean vel: 1.77 cm2
AV Mean grad: 2 mmHg
AV Peak grad: 4.2 mmHg
Ao pk vel: 1.03 m/s
Area-P 1/2: 7.29 cm2
Height: 66 in
MV VTI: 2.36 cm2
S' Lateral: 3 cm
Weight: 2160 [oz_av]

## 2020-12-10 LAB — LIPID PANEL
Cholesterol: 181 mg/dL (ref 0–200)
HDL: 50 mg/dL (ref >40)
LDL Cholesterol: 116 mg/dL — ABNORMAL HIGH (ref 0–99)
Total CHOL/HDL Ratio: 3.6 RATIO
Triglycerides: 73 mg/dL (ref <150)
VLDL: 15 mg/dL (ref 0–40)

## 2020-12-10 LAB — HEPARIN LEVEL (UNFRACTIONATED)
Heparin Unfractionated: <0.1 IU/mL — ABNORMAL LOW (ref 0.30–0.70)
Heparin Unfractionated: 0.16 IU/mL — ABNORMAL LOW (ref 0.30–0.70)

## 2020-12-10 SURGERY — LEFT HEART CATH AND CORONARY ANGIOGRAPHY
Anesthesia: Moderate Sedation

## 2020-12-10 MED ORDER — LIDOCAINE HCL (PF) 1 % IJ SOLN
INTRAMUSCULAR | Status: DC | PRN
  Administered 2020-12-10: 2 mL

## 2020-12-10 MED ORDER — HEPARIN SODIUM (PORCINE) 1000 UNIT/ML IJ SOLN
INTRAMUSCULAR | Status: AC
  Filled 2020-12-10: qty 1

## 2020-12-10 MED ORDER — VERAPAMIL HCL 2.5 MG/ML IV SOLN
INTRAVENOUS | Status: AC
  Filled 2020-12-10: qty 2

## 2020-12-10 MED ORDER — HEPARIN SODIUM (PORCINE) 1000 UNIT/ML IJ SOLN
INTRAMUSCULAR | Status: DC | PRN
  Administered 2020-12-10: 3000 [IU] via INTRAVENOUS

## 2020-12-10 MED ORDER — HEPARIN BOLUS VIA INFUSION
1800.0000 [IU] | Freq: Once | INTRAVENOUS | Status: AC
  Administered 2020-12-10: 1800 [IU] via INTRAVENOUS
  Filled 2020-12-10: qty 1800

## 2020-12-10 MED ORDER — SODIUM CHLORIDE 0.9% FLUSH: 3.0000 mL | INTRAVENOUS | Status: DC | PRN

## 2020-12-10 MED ORDER — SODIUM CHLORIDE 0.9 % WEIGHT BASED INFUSION
3.0000 mL/kg/h | INTRAVENOUS | Status: DC
  Administered 2020-12-10: 3 mL/kg/h via INTRAVENOUS

## 2020-12-10 MED ORDER — MIDAZOLAM HCL 2 MG/2ML IJ SOLN
INTRAMUSCULAR | Status: AC
  Filled 2020-12-10: qty 2

## 2020-12-10 MED ORDER — IOHEXOL 300 MG/ML  SOLN
INTRAMUSCULAR | Status: DC | PRN
  Administered 2020-12-10: 24 mL

## 2020-12-10 MED ORDER — SODIUM CHLORIDE 0.9 % IV SOLN: 250.0000 mL | INTRAVENOUS | Status: DC | PRN

## 2020-12-10 MED ORDER — FENTANYL CITRATE (PF) 100 MCG/2ML IJ SOLN
INTRAMUSCULAR | Status: AC
  Filled 2020-12-10: qty 2

## 2020-12-10 MED ORDER — FUROSEMIDE 20 MG PO TABS
20.0000 mg | ORAL_TABLET | Freq: Every day | ORAL | Status: DC
  Administered 2020-12-10 – 2020-12-11 (×2): 20 mg via ORAL
  Filled 2020-12-10 (×2): qty 1

## 2020-12-10 MED ORDER — SODIUM CHLORIDE 0.9% FLUSH
3.0000 mL | Freq: Two times a day (BID) | INTRAVENOUS | Status: DC
  Administered 2020-12-10 – 2020-12-11 (×2): 3 mL via INTRAVENOUS

## 2020-12-10 MED ORDER — LIDOCAINE HCL 1 % IJ SOLN
INTRAMUSCULAR | Status: AC
  Filled 2020-12-10: qty 20

## 2020-12-10 MED ORDER — SODIUM CHLORIDE 0.9% FLUSH
3.0000 mL | Freq: Two times a day (BID) | INTRAVENOUS | Status: DC
  Administered 2020-12-10: 3 mL via INTRAVENOUS

## 2020-12-10 MED ORDER — MIDAZOLAM HCL 2 MG/2ML IJ SOLN
INTRAMUSCULAR | Status: DC | PRN
  Administered 2020-12-10: 1 mg via INTRAVENOUS

## 2020-12-10 MED ORDER — CARVEDILOL 3.125 MG PO TABS
3.1250 mg | ORAL_TABLET | Freq: Two times a day (BID) | ORAL | Status: DC
  Administered 2020-12-10: 3.125 mg via ORAL
  Filled 2020-12-10 (×2): qty 1

## 2020-12-10 MED ORDER — SODIUM CHLORIDE 0.9 % WEIGHT BASED INFUSION: 1.0000 mL/kg/h | INTRAVENOUS | Status: DC

## 2020-12-10 MED ORDER — VERAPAMIL HCL 2.5 MG/ML IV SOLN
INTRAVENOUS | Status: DC | PRN
  Administered 2020-12-10: 2.5 mg via INTRA_ARTERIAL

## 2020-12-10 MED ORDER — ATORVASTATIN CALCIUM 20 MG PO TABS
20.0000 mg | ORAL_TABLET | Freq: Every day | ORAL | Status: DC
  Administered 2020-12-11: 20 mg via ORAL
  Filled 2020-12-10: qty 1

## 2020-12-10 MED ORDER — HEPARIN (PORCINE) IN NACL 1000-0.9 UT/500ML-% IV SOLN
INTRAVENOUS | Status: DC | PRN
  Administered 2020-12-10 (×2): 500 mL

## 2020-12-10 MED ORDER — FENTANYL CITRATE (PF) 100 MCG/2ML IJ SOLN
INTRAMUSCULAR | Status: DC | PRN
  Administered 2020-12-10: 25 ug via INTRAVENOUS

## 2020-12-10 MED ORDER — ENOXAPARIN SODIUM 40 MG/0.4ML IJ SOSY
40.0000 mg | PREFILLED_SYRINGE | INTRAMUSCULAR | Status: DC
  Administered 2020-12-11: 40 mg via SUBCUTANEOUS
  Filled 2020-12-10: qty 0.4

## 2020-12-10 SURGICAL SUPPLY — 11 items
CATH 5F 110X4 TIG (CATHETERS) ×2 IMPLANT
DEVICE RAD TR BAND REGULAR (VASCULAR PRODUCTS) ×2 IMPLANT
DRAPE BRACHIAL (DRAPES) ×2 IMPLANT
GLIDESHEATH SLEND SS 6F .021 (SHEATH) ×2 IMPLANT
GUIDEWIRE INQWIRE 1.5J.035X260 (WIRE) ×1 IMPLANT
INQWIRE 1.5J .035X260CM (WIRE) ×2
PACK CARDIAC CATH (CUSTOM PROCEDURE TRAY) ×2 IMPLANT
PROTECTION STATION PRESSURIZED (MISCELLANEOUS) ×2
SET ATX SIMPLICITY (MISCELLANEOUS) ×2 IMPLANT
STATION PROTECTION PRESSURIZED (MISCELLANEOUS) ×1 IMPLANT
TUBING CIL FLEX 10 FLL-RA (TUBING) ×2 IMPLANT

## 2020-12-10 NOTE — Assessment & Plan Note (Signed)
-   LDL 116 - Continue statin

## 2020-12-10 NOTE — ED Notes (Signed)
Patient assisted to the restroom, steady gait noted. Urine collected, labeled, and sent to the lab.

## 2020-12-10 NOTE — Assessment & Plan Note (Addendum)
-   presented with CP, diaphoresis, elevated trops and some EKG changes - appreciate cardiology assistance; plan is for heart cath to further eval coronary anatomy - s/p heparin drip - mild nonobs CAD on cath on 8/23 - continue asa and statin -TSH normal, A1c 5.7%

## 2020-12-10 NOTE — Progress Notes (Signed)
Progress Note    Isabella Burch   LTJ:[id number]  DOB: 06-06-1970  DOA: 12/09/2020     1  PCP: Berniece Pap, FNP  Initial CC: CP  Hospital Course: [name] is a 50 yo female with PMH hypertension, rheumatoid arthritis, anxiety, tobacco use who presented with chest pain while she was at church.  The pain radiated from her chest to right arm and up to her jaw with associated shortness of breath and diaphoresis. She presented to the ER for further work-up due to this. Initial troponin was 514 which further up trended and peaked at 3284.  EKG was concerning for subtle ST elevation changes.  She was started on a heparin drip and cardiology was consulted.  Cardiac catheterization was recommended.  Interval History:  Seen in the ER this morning.  Her mother was present bedside as well.  Pain had improved significantly from presentation and onset.  She understands tentative plan is for undergoing heart cath today.  ROS: Constitutional: negative for chills and fevers, Respiratory: negative for cough, Cardiovascular: negative for chest pain, and Gastrointestinal: negative for abdominal pain  Assessment & Plan: * NSTEMI (non-ST elevated myocardial infarction) (HCC) - presented with CP, diaphoresis, elevated trops and some EKG changes - appreciate cardiology assistance; plan is for heart cath to further eval coronary anatomy - continue heparin drip - trops seem to have peaked at 3284 and are downtrending - CP was improved this am when seen in ER - TSH normal. Check A1c. LDL is 116, statin started   Tobacco use - Patient was counseled for cessation in the ER on admission  HLD (hyperlipidemia) - LDL 116 - Continue statin  Ascending aortic aneurysm (HCC) - Patient underwent CTA chest due to complaints of her chest pain followed by dedicated CT angio for dissection as there was a 4 cm ascending thoracic aortic aneurysm noted without dissection.  She is recommended for annual imaging  for follow-up  Rheumatoid arthritis (HCC) - On Arava and Orencia at home  Hypertension - On losartan and HCTZ at home    Old records reviewed in assessment of this patient  Antimicrobials:   DVT prophylaxis: SCDs Start: 12/09/20 1738   Code Status:   Code Status: Full Code Family Communication: Mother  Disposition Plan: Status is: Inpatient  Remains inpatient appropriate because:IV treatments appropriate due to intensity of illness or inability to take PO and Inpatient level of care appropriate due to severity of illness  Dispo: The patient is from: Home              Anticipated d/c is to: Home              Patient currently is not medically stable to d/c.   Difficult to place patient No      Risk of unplanned readmission score: Unplanned Admission- Pilot do not use: 10.68   Objective: Blood pressure 90/71, pulse 86, temperature 98.4 F (36.9 C), resp. rate 17, height 5\' 6"  (1.676 m), weight 61.2 kg, SpO2 97 %.  Examination: General appearance: alert, cooperative, and no distress Head: Normocephalic, without obvious abnormality, atraumatic Eyes:  EOMI Lungs: clear to auscultation bilaterally Heart: regular rate and rhythm and S1, S2 normal Abdomen: normal findings: bowel sounds normal and soft, non-tender Extremities:  No edema Skin: mobility and turgor normal Neurologic: Grossly normal  Consultants:  Cardiology  Procedures:    Data Reviewed: I have personally reviewed following labs and imaging studies Results for orders placed or performed during the  hospital encounter of 12/09/20 (from the past 24 hour(s))  hCG, quantitative, pregnancy     Status: Abnormal   Collection Time: 12/09/20  2:50 PM  Result Value Ref Range   hCG, Beta Chain, Quant, S 5 (H) <5 mIU/mL  Protime-INR     Status: None   Collection Time: 12/09/20  2:51 PM  Result Value Ref Range   Prothrombin Time 13.4 11.4 - 15.2 seconds   INR 1.0 0.8 - 1.2  APTT     Status: None    Collection Time: 12/09/20  2:51 PM  Result Value Ref Range   aPTT 28 24 - 36 seconds  Hepatic function panel     Status: Abnormal   Collection Time: 12/09/20  2:53 PM  Result Value Ref Range   Total Protein 6.3 (L) 6.5 - 8.1 g/dL   Albumin 3.6 3.5 - 5.0 g/dL   AST 24 15 - 41 U/L   ALT 13 0 - 44 U/L   Alkaline Phosphatase 72 38 - 126 U/L   Total Bilirubin 0.6 0.3 - 1.2 mg/dL   Bilirubin, Direct <9.6 0.0 - 0.2 mg/dL   Indirect Bilirubin NOT CALCULATED 0.3 - 0.9 mg/dL  T4, free     Status: None   Collection Time: 12/09/20  2:53 PM  Result Value Ref Range   Free T4 0.82 0.61 - 1.12 ng/dL  TSH     Status: None   Collection Time: 12/09/20  2:53 PM  Result Value Ref Range   TSH 1.159 0.350 - 4.500 uIU/mL  Heparin level (unfractionated)     Status: Abnormal   Collection Time: 12/09/20 11:52 PM  Result Value Ref Range   Heparin Unfractionated <0.10 (L) 0.30 - 0.70 IU/mL  Lipid panel     Status: Abnormal   Collection Time: 12/10/20  6:35 AM  Result Value Ref Range   Cholesterol 181 0 - 200 mg/dL   Triglycerides 73 <045 mg/dL   HDL 50 >40 mg/dL   Total CHOL/HDL Ratio 3.6 RATIO   VLDL 15 0 - 40 mg/dL   LDL Cholesterol 981 (H) 0 - 99 mg/dL  CBC     Status: None   Collection Time: 12/10/20  6:35 AM  Result Value Ref Range   WBC 7.3 4.0 - 10.5 K/uL   RBC 4.19 3.87 - 5.11 MIL/uL   Hemoglobin 13.3 12.0 - 15.0 g/dL   HCT 19.1 47.8 - 29.5 %   MCV 95.9 80.0 - 100.0 fL   MCH 31.7 26.0 - 34.0 pg   MCHC 33.1 30.0 - 36.0 g/dL   RDW 62.1 30.8 - 65.7 %   Platelets 281 150 - 400 K/uL   nRBC 0.0 0.0 - 0.2 %  Comprehensive metabolic panel     Status: Abnormal   Collection Time: 12/10/20  6:35 AM  Result Value Ref Range   Sodium 139 135 - 145 mmol/L   Potassium 4.7 3.5 - 5.1 mmol/L   Chloride 105 98 - 111 mmol/L   CO2 28 22 - 32 mmol/L   Glucose, Bld 115 (H) 70 - 99 mg/dL   BUN 11 6 - 20 mg/dL   Creatinine, Ser 8.46 0.44 - 1.00 mg/dL   Calcium 8.8 (L) 8.9 - 10.3 mg/dL   Total Protein 6.2  (L) 6.5 - 8.1 g/dL   Albumin 3.4 (L) 3.5 - 5.0 g/dL   AST 36 15 - 41 U/L   ALT 13 0 - 44 U/L   Alkaline Phosphatase 72 38 - 126 U/L  Total Bilirubin 1.0 0.3 - 1.2 mg/dL   GFR, Estimated >40 >98 mL/min   Anion gap 6 5 - 15  Magnesium     Status: None   Collection Time: 12/10/20  6:35 AM  Result Value Ref Range   Magnesium 2.0 1.7 - 2.4 mg/dL  Heparin level (unfractionated)     Status: Abnormal   Collection Time: 12/10/20  9:28 AM  Result Value Ref Range   Heparin Unfractionated 0.16 (L) 0.30 - 0.70 IU/mL  Troponin I (High Sensitivity)     Status: Abnormal   Collection Time: 12/10/20  9:28 AM  Result Value Ref Range   Troponin I (High Sensitivity) 3,128 (HH) <18 ng/L  Troponin I (High Sensitivity)     Status: Abnormal   Collection Time: 12/10/20 12:02 PM  Result Value Ref Range   Troponin I (High Sensitivity) 2,791 (HH) <18 ng/L    Recent Results (from the past 240 hour(s))  Resp Panel by RT-PCR (Flu A&B, Covid) Nasopharyngeal Swab     Status: None   Collection Time: 12/09/20  2:26 PM   Specimen: Nasopharyngeal Swab; Nasopharyngeal(NP) swabs in vial transport medium  Result Value Ref Range Status   SARS Coronavirus 2 by RT PCR NEGATIVE NEGATIVE Final    Comment: (NOTE) SARS-CoV-2 target nucleic acids are NOT DETECTED.  The SARS-CoV-2 RNA is generally detectable in upper respiratory specimens during the acute phase of infection. The lowest concentration of SARS-CoV-2 viral copies this assay can detect is 138 copies/mL. A negative result does not preclude SARS-Cov-2 infection and should not be used as the sole basis for treatment or other patient management decisions. A negative result may occur with  improper specimen collection/handling, submission of specimen other than nasopharyngeal swab, presence of viral mutation(s) within the areas targeted by this assay, and inadequate number of viral copies(<138 copies/mL). A negative result must be combined with clinical  observations, patient history, and epidemiological information. The expected result is Negative.  Fact Sheet for Patients:  BloggerCourse.com  Fact Sheet for Healthcare Providers:  SeriousBroker.it  This test is no t yet approved or cleared by the [place] FDA and  has been authorized for detection and/or diagnosis of SARS-CoV-2 by FDA under an Emergency Use Authorization (EUA). This EUA will remain  in effect (meaning this test can be used) for the duration of the COVID-19 declaration under Section 564(b)(1) of the Act, 21 U.S.C.section 360bbb-3(b)(1), unless the authorization is terminated  or revoked sooner.       Influenza A by PCR NEGATIVE NEGATIVE Final   Influenza B by PCR NEGATIVE NEGATIVE Final    Comment: (NOTE) The Xpert Xpress SARS-CoV-2/FLU/RSV plus assay is intended as an aid in the diagnosis of influenza from Nasopharyngeal swab specimens and should not be used as a sole basis for treatment. Nasal washings and aspirates are unacceptable for Xpert Xpress SARS-CoV-2/FLU/RSV testing.  Fact Sheet for Patients: BloggerCourse.com  Fact Sheet for Healthcare Providers: SeriousBroker.it  This test is not yet approved or cleared by the [place] FDA and has been authorized for detection and/or diagnosis of SARS-CoV-2 by FDA under an Emergency Use Authorization (EUA). This EUA will remain in effect (meaning this test can be used) for the duration of the COVID-19 declaration under Section 564(b)(1) of the Act, 21 U.S.C. section 360bbb-3(b)(1), unless the authorization is terminated or revoked.  Performed at [facility], [address], [place], [place] [address]      Radiology Studies: DG Chest 2 View  Result Date: 12/09/2020  CLINICAL DATA:  Chest pain radiating to right neck and jaw. EXAM: CHEST - 2 VIEW COMPARISON:  05/15/2020 FINDINGS: The heart  size and mediastinal contours are within normal limits. Both lungs are clear. The visualized skeletal structures are unremarkable. IMPRESSION: No active cardiopulmonary disease. Electronically Signed   By: Danae Orleans M.D.   On: 12/09/2020 13:25   CT Angio Chest PE W and/or Wo Contrast  Addendum Date: 12/09/2020   ADDENDUM REPORT: 12/09/2020 14:31 ADDENDUM: When measured in the coronal plane the aortic caliber is closer to 3.8 cm still more dilated than expected but difficult to evaluate again due to lack of opacification and motion related artifact. The aortic root is approximately 3 cm. As stated previously exclusion of aortic pathology with CT angiography may be helpful given above findings. Electronically Signed   By: Donzetta Kohut M.D.   On: 12/09/2020 14:31   Result Date: 12/09/2020 CLINICAL DATA:  Suspected pulmonary embolism.  Chest pain. EXAM: CT ANGIOGRAPHY CHEST WITH CONTRAST TECHNIQUE: Multidetector CT imaging of the chest was performed using the standard protocol during bolus administration of intravenous contrast. Multiplanar CT image reconstructions and MIPs were obtained to evaluate the vascular anatomy. CONTRAST:  69mL OMNIPAQUE IOHEXOL 350 MG/ML SOLN COMPARISON:  No cross-sectional comparison is available. FINDINGS: Cardiovascular: 4 cm dilation of the ascending thoracic aorta. Descending thoracic aorta at 2.4 cm. The aorta is not opacified and not well assessed on the current study. Heart size is normal without pericardial effusion. Main pulmonary artery is well opacified mean density of 748 Hounsfield units. No evidence of pulmonary embolism. Mediastinum/Nodes: No axillary lymphadenopathy. No mediastinal adenopathy. No hilar lymphadenopathy. Lungs/Pleura: No consolidation, pneumothorax or pleural effusion. Mild biapical scarring. Mild pulmonary emphysema mainly paraseptal. Upper Abdomen: Incidental imaging of upper abdominal contents without signs of acute process. Musculoskeletal: No  acute musculoskeletal finding. Spinal degenerative changes. Review of the MIP images confirms the above findings. IMPRESSION: 1. No evidence of pulmonary embolism. 2. 4 cm dilation of the ascending thoracic aorta. The aorta is not opacified and not well assessed on the current study. This meets criteria for mild aneurysmal dilation and is seen in the absence of aortic atherosclerosis. Given configuration would consider the possibility of bicuspid aortic valve as a cause for aortic dilation. The aorta is not assessed on the current study due to lack of opacification. Given symptoms of chest pain and abnormal dilation of the aorta, CT angiography may be helpful to exclude the possibility of dissection. 3. Mild pulmonary emphysema mainly paraseptal. 4. Emphysema and aortic atherosclerosis. Aortic Atherosclerosis (ICD10-I70.0) and Emphysema (ICD10-J43.9). These results were called by telephone at the time of interpretation on 12/09/2020 at 2:24 pm to provider [facility] , who verbally acknowledged these results. Electronically Signed: By: Donzetta Kohut M.D. On: 12/09/2020 14:27   CT Angio Chest/Abd/Pel for Dissection W and/or Wo Contrast  Result Date: 12/09/2020 CLINICAL DATA:  Chest pressure into  right neck. EXAM: CT ANGIOGRAPHY CHEST, ABDOMEN AND PELVIS TECHNIQUE: Multidetector CT imaging through the chest, abdomen and pelvis was performed using the standard protocol during bolus administration of intravenous contrast. Multiplanar reconstructed images and MIPs were obtained and reviewed to evaluate the vascular anatomy. CONTRAST:  67mL OMNIPAQUE IOHEXOL 350 MG/ML SOLN COMPARISON:  09/30/2009 FINDINGS: CTA CHEST FINDINGS Cardiovascular: Heart size normal. No pericardial effusion. Satisfactory opacification of pulmonary arteries noted, and there is no evidence of pulmonary emboli. Good contrast opacification of the thoracic aorta. No dissection or stenosis. Bovine variant brachiocephalic arterial origin anatomy  without proximal  stenosis. Aortic Root: --Valve: 2.1 cm --Sinuses: 2.9 cm --Sinotubular Junction: 2.7 cm Limitations by motion: Moderate Thoracic Aorta: --Ascending Aorta: 4 cm --Aortic Arch: 3.7 cm --Descending Aorta: 2.8 cm Mediastinum/Nodes: No hematoma.  No mass or adenopathy. Lungs/Pleura: No pleural effusion. No pneumothorax. Paraseptal emphysema most evident in the upper lobes. Lungs otherwise clear. Musculoskeletal: No chest wall abnormality. No acute or significant osseous findings. Review of the MIP images confirms the above findings. CTA ABDOMEN AND PELVIS FINDINGS VASCULAR Aorta: Minimal calcified plaque. No aneurysm, dissection, or stenosis. Celiac: Patent without evidence of aneurysm, dissection, vasculitis or significant stenosis. SMA: Patent without evidence of aneurysm, dissection, vasculitis or significant stenosis. Replaced right hepatic arterial supply, an anatomic variant. Renals: Both renal arteries are patent without evidence of aneurysm, dissection, vasculitis, fibromuscular dysplasia or significant stenosis. IMA: Patent without evidence of aneurysm, dissection, vasculitis or significant stenosis. Inflow: Patent without evidence of aneurysm, dissection, vasculitis or significant stenosis. Veins: No obvious venous abnormality within the limitations of this arterial phase study. Review of the MIP images confirms the above findings. NON-VASCULAR Hepatobiliary: No focal liver abnormality is seen. No gallstones, gallbladder wall thickening, or biliary dilatation. Pancreas: Unremarkable. No pancreatic ductal dilatation or surrounding inflammatory changes. Spleen: Normal in size without focal abnormality. Adrenals/Urinary Tract: Adrenal glands are unremarkable. Kidneys are normal, without renal calculi, focal lesion, or hydronephrosis. Bladder is unremarkable. Stomach/Bowel: Stomach decompressed. Distal duodenal diverticulum. Jejunum and ileum decompressed. Normal appendix. The colon is nondilated,  unremarkable. Lymphatic: No abdominal or pelvic adenopathy. Reproductive: Uterus and bilateral adnexa are unremarkable. Other: No ascites.  No free air. Musculoskeletal: No acute or significant osseous findings. Review of the MIP images confirms the above findings. IMPRESSION: 1. No acute findings. 2. 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 Recommend annual imaging followup by CTA or MRA. This recommendation follows 2010 ACCF/AHA/AATS/ACR/ASA/SCA/SCAI/SIR/STS/SVM Guidelines for the Diagnosis and Management of Patients with Thoracic Aortic Disease. Circulation. 2010; 121: O536-U440 3.  Emphysema (ICD10-J43.9). Electronically Signed   By: Corlis Leak M.D.   On: 12/09/2020 14:58   CT Angio Chest/Abd/Pel for Dissection W and/or Wo Contrast  Final Result    CT Angio Chest PE W and/or Wo Contrast  Final Result  Addendum (preliminary) 1 of 1  ADDENDUM REPORT: 12/09/2020 14:31    ADDENDUM:  When measured in the coronal plane the aortic caliber is closer to  3.8 cm still more dilated than expected but difficult to evaluate  again due to lack of opacification and motion related artifact. The  aortic root is approximately 3 cm. As stated previously exclusion of  aortic pathology with CT angiography may be helpful given above  findings.      Electronically Signed    By: Donzetta Kohut M.D.    On: 12/09/2020 14:31      Final    DG Chest 2 View  Final Result      Scheduled Meds:  aspirin EC  81 mg Oral Daily   atorvastatin  80 mg Oral Daily   sodium chloride flush  3 mL Intravenous Q12H   PRN Meds: acetaminophen, hydrALAZINE, nitroGLYCERIN, ondansetron (ZOFRAN) IV Continuous Infusions:  sodium chloride 50 mL/hr at 12/10/20 0509   heparin 1,050 Units/hr (12/10/20 1012)     LOS: 1 day  Time spent: Greater than 50% of the 35 minute visit was spent in counseling/coordination of care for the patient as laid out in the A&P.   Lewie Chamber, MD [facility] 12/10/2020, 2:39 PM

## 2020-12-10 NOTE — Consult Note (Signed)
ANTICOAGULATION CONSULT NOTE - Initial Consult  Pharmacy Consult for heparin Indication: chest pain/ACS  Allergies  Allergen Reactions   Enbrel [Etanercept] Swelling   Humira [Adalimumab] Other (See Comments)    kidney dysfunction    Patient Measurements: Height: 5\' 6"  (167.6 cm) Weight: 61.2 kg (135 lb) IBW/kg (Calculated) : 59.3 Heparin Dosing Weight: 61.2kg  Vital Signs: BP: 90/65 (08/22 0112) Pulse Rate: 102 (08/22 0112)  Labs: Recent Labs    12/09/20 1227 12/09/20 1427 12/09/20 1451 12/09/20 2352  HGB 14.2  --   --   --   HCT 40.4  --   --   --   PLT 266  --   --   --   APTT  --   --  28  --   LABPROT  --   --  13.4  --   INR  --   --  1.0  --   HEPARINUNFRC  --   --   --  <0.10*  CREATININE 0.77  --   --   --   TROPONINIHS 514* 3,284*  --   --      Estimated Creatinine Clearance: 80.5 mL/min (by C-G formula based on SCr of 0.77 mg/dL).   Medical History: Past Medical History:  Diagnosis Date   Anxiety    Extensor tendon rupture, non-traumatic, hand and wrist    right   Heart murmur    History of kidney stones    Hypertension    Rheumatoid arthritis (HCC) dx 2006   BILATERAL HANDS AND RIGHT ANKLE    Medications:  PTA: No anticoagulants were noted on PTA med list.  Inpatient: +heparin gtt  Heparin Dosing Weight: 61.2kg  Assessment: Pt presented with chest pain with ACS/PE as likely diagnosis. CT PE was negative for pulmonary embolism. Normal sinus, no st elevation, no twi, normal intervals, but troponin has elevated 514>3284. Pharmacy has been consulted for heparin dosing and monitoring.    Date Time aPTT/HL Rate/Comment 0821 2352 <0.1  Subtherapeutic; 750 > 950 un/hr      Baseline Labs: aPTT - 28s INR - 1.0 Hgb - 14.2 Plts - 266 Trop 2353  Goal of Therapy:  Heparin level 0.3-0.7 units/ml Monitor platelets by anticoagulation protocol: Yes   Plan: HL subtherapeutic at first check. Will bolus and increase rate.  Give 1800 units  bolus x 1; increase heparin infusion to 950 units/hr Check anti-Xa level in 6 hours and daily while on heparin Continue to monitor H&H and platelets  [phone], PharmD, [facility] Clinical Pharmacist  12/10/2020 1:52 AM

## 2020-12-10 NOTE — Consult Note (Signed)
ANTICOAGULATION CONSULT NOTE  Pharmacy Consult for heparin Indication: chest pain/ACS  Allergies  Allergen Reactions   Enbrel [Etanercept] Swelling   Humira [Adalimumab] Other (See Comments)    kidney dysfunction    Patient Measurements: Height: 5\' 6"  (167.6 cm) Weight: 61.2 kg (135 lb) IBW/kg (Calculated) : 59.3 Heparin Dosing Weight: 61.2kg  Vital Signs: Temp: 98.4 F (36.9 C) (08/22 0800) BP: 90/76 (08/22 1030) Pulse Rate: 84 (08/22 1030)  Labs: Recent Labs    12/09/20 1227 12/09/20 1427 12/09/20 1451 12/09/20 2352 12/10/20 0635 12/10/20 0928  HGB 14.2  --   --   --  13.3  --   HCT 40.4  --   --   --  40.2  --   PLT 266  --   --   --  281  --   APTT  --   --  28  --   --   --   LABPROT  --   --  13.4  --   --   --   INR  --   --  1.0  --   --   --   HEPARINUNFRC  --   --   --  <0.10*  --  0.16*  CREATININE 0.77  --   --   --  0.87  --   TROPONINIHS 514* 3,284*  --   --   --  3,128*     Estimated Creatinine Clearance: 74 mL/min (by C-G formula based on SCr of 0.87 mg/dL).   Medical History: Past Medical History:  Diagnosis Date   Anxiety    Extensor tendon rupture, non-traumatic, hand and wrist    right   Heart murmur    History of kidney stones    Hypertension    Rheumatoid arthritis (HCC) dx 2006   BILATERAL HANDS AND RIGHT ANKLE   Tobacco abuse      Assessment: Pt presented with chest pain with ACS/PE as likely diagnosis. CT PE was negative for pulmonary embolism. Normal sinus, no st elevation, no twi, normal intervals, but troponin has elevated 514>3284. Pharmacy has been consulted for heparin dosing and monitoring.        Baseline Labs: aPTT - 28s INR - 1.0 Hgb - 14.2 Plts - 266 Trop 2007  Goal of Therapy:  Heparin level 0.3-0.7 units/ml Monitor platelets by anticoagulation protocol: Yes   Plan:  Heparin level subtherapeutic Heparin 1800 unit bolus Increase heparin infusion to 1050 units/hr Recheck HL at 1600 Continue to  monitor H&H and platelets  [phone], PharmD, BCPS Clinical Pharmacist 12/10/2020 10:45 AM

## 2020-12-10 NOTE — Consult Note (Signed)
Cardiology Consult    Patient ID: Isabella Burch MRN: [id number], DOB/AGE: [id number]   Admit date: 12/09/2020 Date of Consult: 12/10/2020  Primary Physician: Berniece Pap, FNP Primary Cardiologist: Lorine Bears, MD Requesting Provider: Roanna Epley, MD  Patient Profile    Isabella Burch is a 50 y.o. female with a history of HTN, anxiety, RA, and tobacco abuse, who is being seen today for the evaluation of non-STEMI at the request of Dr. Frederick Peers.  Past Medical History   Past Medical History:  Diagnosis Date   Anxiety    Extensor tendon rupture, non-traumatic, hand and wrist    right   Heart murmur    History of kidney stones    Hypertension    Rheumatoid arthritis (HCC) dx 2006   BILATERAL HANDS AND RIGHT ANKLE   Tobacco abuse     Past Surgical History:  Procedure Laterality Date   CARDIAC VALVE REPLACEMENT     CYSTOSCOPY WITH URETEROSCOPY, STONE BASKETRY AND STENT PLACEMENT  2011   LEFT WRIST EXTENSOR TENDON REPAIR     TENDON TRANSFER Right 11/09/2014   Procedure: RIGHT THUMB EIP TO EPL TENDON TRANSFER;  Surgeon: Bradly Bienenstock, MD;  Location: [facility];  Service: Orthopedics;  Laterality: Right;   TUBAL LIGATION       Allergies  Allergies  Allergen Reactions   Enbrel [Etanercept] Swelling   Humira [Adalimumab] Other (See Comments)    kidney dysfunction    History of Present Illness    50 year old female with the above past medical history including hypertension, anxiety, rheumatoid arthritis, and tobacco abuse.  She also has a family history of coronary artery disease involving both parents.  She lives locally.  She does not routinely exercise but works full-time and generally does not experience any limitations in activity.  She notes that she was evaluated for chest pain sometime between 2 and 5 years ago and was told that symptoms were likely secondary to anxiety.  She was started on Cymbalta and had no recurrent symptoms over the past  several years.  She was in her usual state of health until August 22, while she was at church and had just sat down and had sudden onset of substernal chest heaviness with radiation to the jaw, associated with diaphoresis, nausea, and dyspnea.  The jaw pain was quite severe.  Her sisters were sitting around her and they all went to the back of the church.  Her 1 sister, who is a Engineer, civil (consulting), checked her pulse and they were monitoring her however, due to progressive jaw pain, they elected to call EMS.  Upon EMS arrival, she was given 4 baby aspirin with improvement in chest pressure but persistence of jaw pain and other symptoms.  She was taken to the [facility] ED where ECG showed sinus rhythm at 64 bpm, left atrial lodgment, prior anteroseptal infarct, and 1 mm ST elevation in leads V1 and V2 with more subtle ST elevation in V3 through V6.  CT angio of the chest was performed and showed no evidence of PE.  4 cm dilation of the ascending aorta was noted as well as mild pulmonary emphysema and aortic atherosclerosis.  An addendum was subsequently added to the report noting that the aortic caliber was closer to 3.8 cm.  Patient was eventually treated with morphine with complete resolution of all symptoms.  Initial troponin was elevated at 514 with subsequent of 3284.  She was placed on heparin and high potency statin therapy with plan  for admission.  Currently she is chest pain-free.  Follow-up ECG and troponin are pending this morning.  Inpatient Medications     aspirin EC  81 mg Oral Daily   atorvastatin  80 mg Oral Daily   sodium chloride flush  3 mL Intravenous Q12H    Family History    Family History  Problem Relation Age of Onset   Hypothyroidism Mother    Coronary artery disease Mother    Hyperlipidemia Father    Hypertension Father    Diabetes Father    She indicated that her mother is alive. She indicated that her father is deceased. She indicated that both of her sisters are alive. She indicated that  her daughter is alive. She indicated that her son is alive.   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    Employer: [employer]    Comment: Engineer, maintenance (IT)   Occupation: Estate agent    Comment: Part-time job  Tobacco Use   Smoking status: Every Day    Packs/day: 0.50    Years: 25.00    Pack years: 12.50    Types: Cigarettes   Smokeless tobacco: Never   Tobacco comments:    0.5 ppd for much of her life but currently smoking 2-4 cigarettes/day (11/2020)  Substance and Sexual Activity   Alcohol use: No    Alcohol/week: 0.0 standard drinks   Drug use: No   Sexual activity: Not on file  Other Topics Concern   Not on file  Social History Narrative   Isabella Burch grew up in [place]. She lives at home with her husband, two children and a granddaughter. They have 2 dogs (great pyreneees and dachbull).  She works at the [employer] in [employer]. She enjoys working and being around children.    Social Determinants of Health   Financial Resource Strain: Not on file  Food Insecurity: Not on file  Transportation Needs: Not on file  Physical Activity: Not on file  Stress: Not on file  Social Connections: Not on file  Intimate Partner Violence: Not on file     Review of Systems    General:  +++ diaphoresis in the setting of chest pain.  No chills, fever, night sweats or weight changes.  Cardiovascular:  +++ chest pain, +++ dyspnea, no edema, orthopnea, palpitations, paroxysmal nocturnal dyspnea. Dermatological: No rash, lesions/masses Respiratory: No cough, +++ dyspnea Urologic: No hematuria, dysuria Abdominal:   +++ nausea, vomiting, diarrhea, bright red blood per rectum, melena, or hematemesis Neurologic:  No visual changes,  wkns, changes in mental status. All other systems reviewed and are otherwise negative except as noted above.  Physical Exam    Blood pressure (!) 88/72, pulse 97, temperature 98.6 F (37 C), temperature source Oral, resp. rate 12, height 5\' 6"  (1.676 m), weight 61.2 kg, SpO2 95 %.  General: Pleasant, NAD Psych: Normal affect. Neuro: Alert and oriented X 3. Moves all extremities spontaneously. HEENT: Normal  Neck: Supple without bruits or JVD. Lungs:  Resp regular and unlabored, CTA. Heart: RRR no s3, s4, or murmurs.  Normal Allen's test on the right. Abdomen: Soft, non-tender, non-distended, BS + x 4.  Extremities: No clubbing, cyanosis or edema. DP/PT2+, Radials 2+ and equal bilaterally.  Labs    Cardiac Enzymes Recent Labs  Lab 12/09/20 1227 12/09/20 1427  TROPONINIHS 514* 3,284*      Lab Results  Component Value Date   WBC 7.3 12/10/2020   HGB 13.3 12/10/2020   HCT 40.2 12/10/2020   MCV 95.9 12/10/2020   PLT 281 12/10/2020    Recent Labs  Lab 12/10/20 0635  NA 139  K 4.7  CL 105  CO2 28  BUN 11  CREATININE 0.87  CALCIUM 8.8*  PROT 6.2*  BILITOT 1.0  ALKPHOS 72  ALT 13  AST 36  GLUCOSE 115*   Lab Results  Component Value Date   CHOL 181 12/10/2020   HDL 50 12/10/2020   LDLCALC 116 (H) 12/10/2020   TRIG 73 12/10/2020     Radiology Studies    DG Chest 2 View  Result Date: 12/09/2020 CLINICAL DATA:  Chest pain radiating to right neck and jaw. EXAM: CHEST - 2 VIEW COMPARISON:  05/15/2020 FINDINGS: The heart size and mediastinal contours are within normal limits. Both lungs are clear. The visualized skeletal structures are unremarkable. IMPRESSION: No active cardiopulmonary disease. Electronically Signed   By: Danae Orleans M.D.   On: 12/09/2020 13:25   CT Angio Chest PE W and/or Wo Contrast  Addendum Date: 12/09/2020   ADDENDUM REPORT: 12/09/2020 14:31 ADDENDUM: When measured in the coronal plane the aortic caliber is closer to 3.8 cm still more  dilated than expected but difficult to evaluate again due to lack of opacification and motion related artifact. The aortic root is approximately 3 cm. As stated previously exclusion of aortic pathology with CT angiography may be helpful given above findings. Electronically Signed   By: Donzetta Kohut M.D.   On: 12/09/2020 14:31   Result Date: 12/09/2020 CLINICAL DATA:  Suspected pulmonary embolism.  Chest pain. EXAM: CT ANGIOGRAPHY CHEST WITH CONTRAST TECHNIQUE: Multidetector CT imaging of the chest was performed using the standard protocol during bolus administration of intravenous contrast. Multiplanar CT image reconstructions and MIPs were obtained to evaluate the vascular anatomy. CONTRAST:  24mL OMNIPAQUE IOHEXOL 350 MG/ML SOLN COMPARISON:  No cross-sectional comparison is available. FINDINGS: Cardiovascular: 4 cm dilation of the ascending thoracic aorta. Descending thoracic aorta at 2.4 cm. The aorta is not opacified and not well assessed on the current study. Heart size is normal without pericardial effusion. Main pulmonary artery is well opacified mean density of 748 Hounsfield units. No evidence of pulmonary embolism. Mediastinum/Nodes: No axillary lymphadenopathy. No mediastinal adenopathy. No hilar lymphadenopathy. Lungs/Pleura: No consolidation, pneumothorax or pleural effusion. Mild biapical scarring. Mild pulmonary emphysema mainly paraseptal. Upper Abdomen: Incidental imaging of upper abdominal contents without signs of acute process. Musculoskeletal: No acute musculoskeletal finding. Spinal degenerative changes. Review of the MIP images confirms the above findings. IMPRESSION: 1. No evidence of pulmonary embolism. 2. 4 cm dilation of the ascending thoracic aorta. The aorta is not opacified and not well assessed on the current study. This meets criteria for mild aneurysmal dilation and is seen in the absence of aortic atherosclerosis. Given configuration would consider the possibility of bicuspid  aortic valve as a cause for aortic dilation. The aorta is not assessed on the current study due to lack of opacification. Given symptoms of chest pain and abnormal dilation of the aorta, CT angiography may be helpful to exclude the possibility of dissection. 3. Mild pulmonary emphysema mainly paraseptal. 4. Emphysema and aortic atherosclerosis. Aortic Atherosclerosis (ICD10-I70.0) and Emphysema (ICD10-J43.9). These results were called by telephone at the time of interpretation  on 12/09/2020 at 2:24 pm to provider [facility] , who verbally acknowledged these results. Electronically Signed: By: Donzetta Kohut M.D. On: 12/09/2020 14:27   CT Angio Chest/Abd/Pel for Dissection W and/or Wo Contrast  Result Date: 12/09/2020 CLINICAL DATA:  Chest pressure into  right neck. EXAM: CT ANGIOGRAPHY CHEST, ABDOMEN AND PELVIS TECHNIQUE: Multidetector CT imaging through the chest, abdomen and pelvis was performed using the standard protocol during bolus administration of intravenous contrast. Multiplanar reconstructed images and MIPs were obtained and reviewed to evaluate the vascular anatomy. CONTRAST:  74mL OMNIPAQUE IOHEXOL 350 MG/ML SOLN COMPARISON:  09/30/2009 FINDINGS: CTA CHEST FINDINGS Cardiovascular: Heart size normal. No pericardial effusion. Satisfactory opacification of pulmonary arteries noted, and there is no evidence of pulmonary emboli. Good contrast opacification of the thoracic aorta. No dissection or stenosis. Bovine variant brachiocephalic arterial origin anatomy without proximal stenosis. Aortic Root: --Valve: 2.1 cm --Sinuses: 2.9 cm --Sinotubular Junction: 2.7 cm Limitations by motion: Moderate Thoracic Aorta: --Ascending Aorta: 4 cm --Aortic Arch: 3.7 cm --Descending Aorta: 2.8 cm Mediastinum/Nodes: No hematoma.  No mass or adenopathy. Lungs/Pleura: No pleural effusion. No pneumothorax. Paraseptal emphysema most evident in the upper lobes. Lungs otherwise clear. Musculoskeletal: No chest wall abnormality.  No acute or significant osseous findings. Review of the MIP images confirms the above findings. CTA ABDOMEN AND PELVIS FINDINGS VASCULAR Aorta: Minimal calcified plaque. No aneurysm, dissection, or stenosis. Celiac: Patent without evidence of aneurysm, dissection, vasculitis or significant stenosis. SMA: Patent without evidence of aneurysm, dissection, vasculitis or significant stenosis. Replaced right hepatic arterial supply, an anatomic variant. Renals: Both renal arteries are patent without evidence of aneurysm, dissection, vasculitis, fibromuscular dysplasia or significant stenosis. IMA: Patent without evidence of aneurysm, dissection, vasculitis or significant stenosis. Inflow: Patent without evidence of aneurysm, dissection, vasculitis or significant stenosis. Veins: No obvious venous abnormality within the limitations of this arterial phase study. Review of the MIP images confirms the above findings. NON-VASCULAR Hepatobiliary: No focal liver abnormality is seen. No gallstones, gallbladder wall thickening, or biliary dilatation. Pancreas: Unremarkable. No pancreatic ductal dilatation or surrounding inflammatory changes. Spleen: Normal in size without focal abnormality. Adrenals/Urinary Tract: Adrenal glands are unremarkable. Kidneys are normal, without renal calculi, focal lesion, or hydronephrosis. Bladder is unremarkable. Stomach/Bowel: Stomach decompressed. Distal duodenal diverticulum. Jejunum and ileum decompressed. Normal appendix. The colon is nondilated, unremarkable. Lymphatic: No abdominal or pelvic adenopathy. Reproductive: Uterus and bilateral adnexa are unremarkable. Other: No ascites.  No free air. Musculoskeletal: No acute or significant osseous findings. Review of the MIP images confirms the above findings. IMPRESSION: 1. No acute findings. 2. 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 Recommend annual imaging followup by CTA or MRA. This recommendation follows 2010  ACCF/AHA/AATS/ACR/ASA/SCA/SCAI/SIR/STS/SVM Guidelines for the Diagnosis and Management of Patients with Thoracic Aortic Disease. Circulation. 2010; 121: N562-Z308 3.  Emphysema (ICD10-J43.9). Electronically Signed   By: Corlis Leak M.D.   On: 12/09/2020 14:58    ECG & Cardiac Imaging    Regular sinus rhythm, 64, left atrial argument, prior anteroseptal infarct with approximately 1 mm ST segment elevation in leads V1 and V2 and more subtle ST elevation in V3 through V6- personally reviewed.  Findings of poor R wave progression similar to 2017 ECG however, ST segment elevation new.  Assessment & Plan    1.  Acute coronary syndrome: Patient without prior cardiac history but with risk factors including family history, hypertension, and tobacco abuse.  She developed substernal chest heaviness and pressure radiating to her jaw, associated with dyspnea, diaphoresis,  and nausea, while sitting in church on August 21.  Symptoms improved some with aspirin and then eventually resolved several hours later with morphine in the emergency department.  Initial ECG showed prior anteroseptal infarct with approximately 1 mm ST segment elevation in leads V1 and V2 and more subtle ST elevation in V3 through V6.  Troponin was elevated at 514 subsequently rose to 3284.  Follow-up ECG and troponin pending this morning.  CTA of the chest was negative for PE but did show a 3.8 cm dilated thoracic aorta.  Currently, patient is pain-free.  She remains on aspirin, heparin, and statin therapy.  Blood pressure is soft, precluding initiation of beta-blocker.  Her mother is at her bedside, who has a history of heart disease and is also followed by our practice.  We will plan on diagnostic catheterization this afternoon.  She received contrast with CTs, I will initiate aggressive hydration.  The patient understands that risks include but are not limited to stroke (1 in 1000), death (1 in 1000), kidney failure [usually temporary] (1 in 500),  bleeding (1 in 200), allergic reaction [possibly serious] (1 in 200), and agrees to proceed.  Follow-up echo.  Eventual cardiac rehab.  2.  Essential HTN:  BP soft in ED.  Follow.  On losartan and HCTZ at home.  3.  Lipids: Total cholesterol 181 with an LDL of 116.  In the setting of above, continue high potency statin therapy.  4.  Dilated ascending aorta: CTA of the chest initially read as 4 cm ascending thoracic aortic aneurysm without dissection.  Addendum added noting that size is closer to 3.8 cm.  Will require aggressive blood pressure management and annual imaging.  Echo pending.  5.  Tobacco abuse: Currently smoking 2 to 4 cigarettes/day.  Complete cessation advised.  6.  Anxiety: This has been stable on Cymbalta in the outpatient setting.  7.  Rheumatoid arthritis: Followed by rheumatology in the outpatient setting.  Signed, Nicolasa Ducking, NP 12/10/2020, 8:00 AM  For questions or updates, please contact   Please consult [email or web address] for contact info under Cardiology/STEMI.

## 2020-12-10 NOTE — Assessment & Plan Note (Signed)
-   On losartan and HCTZ at home

## 2020-12-10 NOTE — Assessment & Plan Note (Signed)
-   Patient was counseled for cessation in the ER on admission

## 2020-12-10 NOTE — Assessment & Plan Note (Signed)
-   Patient underwent CTA chest due to complaints of her chest pain followed by dedicated CT angio for dissection as there was a 4 cm ascending thoracic aortic aneurysm noted without dissection.  She is recommended for annual imaging for follow-up

## 2020-12-10 NOTE — Progress Notes (Signed)
*  PRELIMINARY RESULTS* Echocardiogram 2D Echocardiogram has been performed.  Joanette Gula Shala Baumbach 12/10/2020, 8:58 AM

## 2020-12-10 NOTE — ED Notes (Signed)
Lab at the bedside 

## 2020-12-10 NOTE — Assessment & Plan Note (Signed)
-   On Arava and Orencia at home

## 2020-12-10 NOTE — Hospital Course (Addendum)
Ms. Rochette is a 51 yo female with PMH hypertension, rheumatoid arthritis, anxiety, tobacco use who presented with chest pain while she was at church.  The pain radiated from her chest to right arm and up to her jaw with associated shortness of breath and diaphoresis. She presented to the ER for further work-up due to this. Initial troponin was 514 which further up trended and peaked at 3284.  EKG was concerning for subtle ST elevation changes.  She was started on a heparin drip and cardiology was consulted.  Cardiac catheterization was recommended Cath showed nonobstructive CAD.  Mid LAD lesion 20% stenosed. Echo was notable for severely reduced EF, 2025% with regional wall motion abnormalities and significant akinesis.  Findings were concerning for underlying stress-induced cardiomyopathy. The patient endorsed that she is under a severe amount of stress at home and has been unable to manage it well and also has been out of her Cymbalta due to changing of practices with her PCP. Her blood pressure was low/normal which prevented full initiation of GDMT.  She will follow-up with cardiology at discharge for further medication adjustment as needed.

## 2020-12-11 ENCOUNTER — Telehealth: Payer: Self-pay | Admitting: Adult Health

## 2020-12-11 ENCOUNTER — Encounter: Payer: Self-pay | Admitting: Cardiovascular Disease

## 2020-12-11 DIAGNOSIS — I5181 Takotsubo syndrome: Secondary | ICD-10-CM | POA: Diagnosis not present

## 2020-12-11 DIAGNOSIS — F32A Depression, unspecified: Secondary | ICD-10-CM

## 2020-12-11 LAB — CBC
HCT: 38.9 % (ref 36.0–46.0)
Hemoglobin: 12.9 g/dL (ref 12.0–15.0)
MCH: 32.3 pg (ref 26.0–34.0)
MCHC: 33.2 g/dL (ref 30.0–36.0)
MCV: 97.3 fL (ref 80.0–100.0)
Platelets: 257 10*3/uL (ref 150–400)
RBC: 4 MIL/uL (ref 3.87–5.11)
RDW: 13.2 % (ref 11.5–15.5)
WBC: 5.5 10*3/uL (ref 4.0–10.5)
nRBC: 0 % (ref 0.0–0.2)

## 2020-12-11 LAB — BASIC METABOLIC PANEL
Anion gap: 7 (ref 5–15)
BUN: 14 mg/dL (ref 6–20)
CO2: 29 mmol/L (ref 22–32)
Calcium: 8.6 mg/dL — ABNORMAL LOW (ref 8.9–10.3)
Chloride: 102 mmol/L (ref 98–111)
Creatinine, Ser: 0.92 mg/dL (ref 0.44–1.00)
GFR, Estimated: >60 mL/min (ref >60)
Glucose, Bld: 110 mg/dL — ABNORMAL HIGH (ref 70–99)
Potassium: 4.1 mmol/L (ref 3.5–5.1)
Sodium: 138 mmol/L (ref 135–145)

## 2020-12-11 LAB — MAGNESIUM: Magnesium: 2 mg/dL (ref 1.7–2.4)

## 2020-12-11 MED ORDER — DULOXETINE HCL 30 MG PO CPEP
30.0000 mg | ORAL_CAPSULE | Freq: Every day | ORAL | Status: DC
  Administered 2020-12-11: 30 mg via ORAL
  Filled 2020-12-11: qty 1

## 2020-12-11 MED ORDER — METOPROLOL SUCCINATE ER 25 MG PO TB24: 12.5000 mg | ORAL_TABLET | Freq: Every day | ORAL | 3 refills | Status: DC

## 2020-12-11 MED ORDER — FUROSEMIDE 20 MG PO TABS: 20.0000 mg | ORAL_TABLET | Freq: Every day | ORAL | 3 refills | Status: DC

## 2020-12-11 MED ORDER — ATORVASTATIN CALCIUM 20 MG PO TABS: 20.0000 mg | ORAL_TABLET | Freq: Every day | ORAL | 3 refills | Status: DC

## 2020-12-11 MED ORDER — ASPIRIN 81 MG PO TBEC: 81.0000 mg | DELAYED_RELEASE_TABLET | Freq: Every day | ORAL | 11 refills | Status: DC

## 2020-12-11 MED ORDER — DULOXETINE HCL 30 MG PO CPEP: 30.0000 mg | ORAL_CAPSULE | Freq: Every day | ORAL | 2 refills | Status: DC

## 2020-12-11 MED ORDER — METOPROLOL SUCCINATE ER 25 MG PO TB24
12.5000 mg | ORAL_TABLET | Freq: Every day | ORAL | Status: DC
  Administered 2020-12-11: 12.5 mg via ORAL
  Filled 2020-12-11: qty 1

## 2020-12-11 NOTE — Telephone Encounter (Signed)
Patient needing refills of the Cymbalta. Patient is completely out of the medication.   Please advise    Currently in the hospital for heart attack.

## 2020-12-11 NOTE — Progress Notes (Signed)
Progress Note  Patient Name: Isabella Burch Date of Encounter: 12/11/2020  Primary Cardiologist: New to [facility] - consult by Arida  Subjective   No chest pain, dyspnea, palpitations, dizziness, presyncope, or syncope. She has not had a cigarette since prior to church on 8/21.  Inpatient Medications    Scheduled Meds:  aspirin EC  81 mg Oral Daily   atorvastatin  20 mg Oral Daily   carvedilol  3.125 mg Oral BID WC   DULoxetine  30 mg Oral Daily   enoxaparin (LOVENOX) injection  40 mg Subcutaneous Q24H   furosemide  20 mg Oral Daily   sodium chloride flush  3 mL Intravenous Q12H   sodium chloride flush  3 mL Intravenous Q12H   Continuous Infusions:  sodium chloride Stopped (12/10/20 2135)   PRN Meds: sodium chloride, acetaminophen, nitroGLYCERIN, ondansetron (ZOFRAN) IV, sodium chloride flush   Vital Signs    Vitals:   12/10/20 1800 12/10/20 1845 12/10/20 2126 12/11/20 0357  BP: 90/69 (!) 88/69 94/70 93/71   Pulse: 81 78 81 83  Resp: (!) 22 20 20 18   Temp:  98.2 F (36.8 C) 98.6 F (37 C) 98.5 F (36.9 C)  TempSrc:   Oral Oral  SpO2: 97% 99% 100% 99%  Weight:      Height:        Intake/Output Summary (Last 24 hours) at 12/11/2020 4888 Last data filed at 12/10/2020 2135 Gross per 24 hour  Intake 250 ml  Output --  Net 250 ml   Filed Weights   12/09/20 1225 12/10/20 1505  Weight: 61.2 kg 61.2 kg    Telemetry    SR - Personally Reviewed  ECG    No new tracings - Personally Reviewed  Physical Exam   GEN: No acute distress.   Neck: No JVD. Cardiac: RRR, no murmurs, rubs, or gallops. Right radial cardiac cath site without active bleeding, swelling, warmth, erythema, or TTP. Radial pulse 2+.  Respiratory: Clear to auscultation bilaterally.  GI: Soft, nontender, non-distended.   MS: No edema; No deformity. Neuro:  Alert and oriented x 3; Nonfocal.  Psych: Normal affect.  Labs    Chemistry Recent Labs  Lab 12/09/20 1227 12/09/20 1453  12/10/20 0635 12/11/20 0511  NA 139  --  139 138  K 3.5  --  4.7 4.1  CL 105  --  105 102  CO2 26  --  28 29  GLUCOSE 138*  --  115* 110*  BUN 11  --  11 14  CREATININE 0.77  --  0.87 0.92  CALCIUM 8.7*  --  8.8* 8.6*  PROT  --  6.3* 6.2*  --   ALBUMIN  --  3.6 3.4*  --   AST  --  24 36  --   ALT  --  13 13  --   ALKPHOS  --  72 72  --   BILITOT  --  0.6 1.0  --   GFRNONAA >60  --  >60 >60  ANIONGAP 8  --  6 7     Hematology Recent Labs  Lab 12/09/20 1227 12/10/20 0635 12/11/20 0511  WBC 5.9 7.3 5.5  RBC 4.18 4.19 4.00  HGB 14.2 13.3 12.9  HCT 40.4 40.2 38.9  MCV 96.7 95.9 97.3  MCH 34.0 31.7 32.3  MCHC 35.1 33.1 33.2  RDW 12.8 13.2 13.2  PLT 266 281 257    Cardiac EnzymesNo results for input(s): TROPONINI in the last 168 hours. No  results for input(s): TROPIPOC in the last 168 hours.   BNPNo results for input(s): BNP, PROBNP in the last 168 hours.   DDimer No results for input(s): DDIMER in the last 168 hours.   Radiology    DG Chest 2 View  Result Date: 12/09/2020 IMPRESSION: No active cardiopulmonary disease. Electronically Signed   By: Danae Orleans M.D.   On: 12/09/2020 13:25   CT Angio Chest PE W and/or Wo Contrast  Addendum Date: 12/09/2020   ADDENDUM REPORT: 12/09/2020 14:31 ADDENDUM: When measured in the coronal plane the aortic caliber is closer to 3.8 cm still more dilated than expected but difficult to evaluate again due to lack of opacification and motion related artifact. The aortic root is approximately 3 cm. As stated previously exclusion of aortic pathology with CT angiography may be helpful given above findings. Electronically Signed   By: Donzetta Kohut M.D.   On: 12/09/2020 14:31   Result Date: 12/09/2020 IMPRESSION: 1. No evidence of pulmonary embolism. 2. 4 cm dilation of the ascending thoracic aorta. The aorta is not opacified and not well assessed on the current study. This meets criteria for mild aneurysmal dilation and is seen in the  absence of aortic atherosclerosis. Given configuration would consider the possibility of bicuspid aortic valve as a cause for aortic dilation. The aorta is not assessed on the current study due to lack of opacification. Given symptoms of chest pain and abnormal dilation of the aorta, CT angiography may be helpful to exclude the possibility of dissection. 3. Mild pulmonary emphysema mainly paraseptal. 4. Emphysema and aortic atherosclerosis. Aortic Atherosclerosis (ICD10-I70.0) and Emphysema (ICD10-J43.9). These results were called by telephone at the time of interpretation on 12/09/2020 at 2:24 pm to provider [facility] , who verbally acknowledged these results. Electronically Signed: By: Donzetta Kohut M.D. On: 12/09/2020 14:27    CT Angio Chest/Abd/Pel for Dissection W and/or Wo Contrast  Result Date: 12/09/2020 IMPRESSION: 1. No acute findings. 2. 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 Recommend annual imaging followup by CTA or MRA. This recommendation follows 2010 ACCF/AHA/AATS/ACR/ASA/SCA/SCAI/SIR/STS/SVM Guidelines for the Diagnosis and Management of Patients with Thoracic Aortic Disease. Circulation. 2010; 121: K440-N027 3.  Emphysema (ICD10-J43.9). Electronically Signed   By: Corlis Leak M.D.   On: 12/09/2020 14:58    Cardiac Studies   2D echo 12/10/2020: 1. Left ventricular ejection fraction, by estimation, is 20 to 25%. The  left ventricle has severely decreased function. The left ventricle  demonstrates regional wall motion abnormalities (see scoring  diagram/findings for description). There is akinesis  of the left ventricular, mid-apical anterior wall, inferior wall and  apical segment. The average left ventricular global longitudinal strain is  -8.5 %. The global longitudinal strain is abnormal. Wall motion is highly  suggestive of stress induced  cardiomyopathy.   2. Right ventricular systolic function is normal. The right ventricular  size is normal. Tricuspid  regurgitation signal is inadequate for assessing  PA pressure.   3. The mitral valve is normal in structure. Mild mitral valve  regurgitation. No evidence of mitral stenosis.   4. The aortic valve is normal in structure. Aortic valve regurgitation is  not visualized. No aortic stenosis is present.   5. The inferior vena cava is normal in size with greater than 50%  respiratory variability, suggesting right atrial pressure of 3 mmHg.  __________  LHC 12/10/2020:   Mid LAD lesion is 20% stenosed.   1.  Mild nonobstructive coronary artery disease.  Mild mid  LAD myocardial bridge. 2.  Left ventricular angiography was not performed.  EF was severely reduced by echo. 3.  Severely elevated left ventricular end-diastolic pressure at 32 mmHg.   Recommendations: The patient's presentation and echocardiogram findings are consistent with stress-induced cardiomyopathy. She is volume overloaded.  I started small dose furosemide and small dose carvedilol.  If blood pressure tolerates, start a small dose ACE inhibitor or ARB tomorrow.  Her blood pressure is too low to tolerate Entresto.  Patient Profile     50 y.o. female with history of HTN, RA, anxiety, and tobacco use who we are seeing for evaluation of elevated troponin/stress-induced cardiomyopathy.  Assessment & Plan    1. Takotsubo cardiomyopathy: -Appears grossly euvolemic and well compensated -BP soft in the low 90s mmHg systolic, hold Coreg this morning -Continue po Lasix 20 mg daily -If BP allows at discharge (> 100 mmHg systolic), would send home on low dose Coreg -Relative hypotension precludes the addition of further GDMT including ACEi/ARB/ARNI/MRA/SGLT2i -Escalate evidence-based medical therapy as tolerated in the outpatient setting  -Will need a follow up echo as an outpatient in several weeks to reassess LVSF -CHF education  2. ACS: -LHC showed mild nonobstructive CAD as outlined above -Aggressive risk factor modification and  medical management  -ASA -Post cath instructions  3. Dilated ascending aorta: -Aggressive BP management -Annual outpatient imaging -Smoking cessation recommended   4. HTN: -BP soft leading to the holding of Coreg this morning  5. HLD: -LDL 116 with goal being < 70 -High-intensity statin  -Follow up fasting lipid panel and LFT in ~ 8 weeks  6. Tobacco use: -Complete cessation is recommended -Wants to try on her own initially   7. Anxiety/stress: -As outpatient on Cymbalta -Follow up  with PCP  For questions or updates, please contact [facility] Please consult [email or web address] for contact info under Cardiology/STEMI.    Signed, Eula Listen, PA-C [facility] Pager: [phone] 12/11/2020, 8:22 AM

## 2020-12-11 NOTE — Assessment & Plan Note (Signed)
-   EF 20-25% with akinesis and RWMA - started on lasix, toprol, asa per cardiology - BP not tolerant for further initiation of GDMT - will need outpt repeat echo and further med adjustments

## 2020-12-11 NOTE — Consult Note (Addendum)
   Heart Failure Nurse Navigator Note  HFrEF 20 to 25%.  Normal right ventricular systolic function. Akinesis  of left ventricular, mid apical anterior wall, inferior wall and apical segment.  Longitudinal strain is abnormal and suggestive of Takotsubo's cardiomyopathy   She presented to the emergency room with complaints of chest pain that radiated to her jaw along with shortness of breath.  Comorbidities:  Anxiety Hypertension Rheumatoid arthritis  Medications:  Aspirin 81 mg daily Atorvastatin 20 mg daily Furosemide 20 mg daily Metoprolol succinate 12 and half milligrams daily  Per cardiology's note, blood pressures have been soft,if  they are greater than 100 mmHg systolic can be discharged home on Coreg, the hypotension precludes any addition of GDMT, will advance as an outpatient if tolerated.  Labs:  Sodium 138, potassium 4.1, chloride 102, CO2 29, BUN 14, creatinine 0.9, magnesium 2  Intake 250 mL Output not documented Weight was not initially obtained but after requested it was 58.5 kg yesterday's weight reported at 61.2 kg.  Assessment:  Gaye Alken is awake and alert sitting up in the chair at bedside, mother is present, times patient becomes tearful.  HEENT-pupils are equal, normocephalic no JVD noted.  Cardiac-heart tones of regular rate and rhythm no murmurs gallops appreciated.  Chest-breath sounds are clear to posterior auscultation.  Musculoskeletal-there is no lower extremity edema noted  Neurologic-speech is clear moves all extremities without difficulty  Psych-is pleasant and appropriate, makes good eye contact and does become tearful at times when discussing her situation.   Initial meeting with patient.  Mother is present for part of the interview as she steps out to take several phone calls.  Patient is able to tell me about her heart condition, she does become tearful at times especially questioning how she is going to manage stress.  She  states in addition to taking care of her husband, she recently lost her dad.  She also has a job where she works 58 hours a week as she has to work. She states in the near past she was homeless-living in a tent.  Discussed managing stress and anxiety, recommended if she feels up to it taking walks, meditation.  Also discussed low-sodium diet and restricting fluid intake to 64 ounces in a 24-hour period.  Went over the importance of daily weight and what to report.  She goes on to states she feels that she has been given a doom and gloom diagnosis, I explained to her that on the right medications and taking care of herself as listed above a lot of times we see this Takotsubo's cardiomyopathy reverse.  I explained to her that that physicians will be doing an echocardiogram in a few weeks to check on that progress.  Also stressed the importance of following up in the outpatient heart failure clinic with [facility] for making sure she is on the correct medications etc. and that she has also a good resource if she has questions or concerns.  Patient voices understanding.   Tresa Endo RN [facility]

## 2020-12-11 NOTE — Assessment & Plan Note (Signed)
-   Patient's PCP changed practices and she has been unable to follow-up with her yet.  Patient has been out of Cymbalta and has significant life stressors going on at home and now having stress-induced cardiomyopathy contributing to MI -Cymbalta refilled - Consider referral to a counselor or psychology for ongoing management outpatient

## 2020-12-11 NOTE — Progress Notes (Signed)
Patient  discharging home. Vital signs stable at time of discharge as reflected in discharge summary. Discharge instructions given and verbal understanding returned. Patient has scheduled CHF appointment scheduled. No questions at this time.

## 2020-12-11 NOTE — Discharge Summary (Signed)
Physician Discharge Summary   Isabella Burch:[id number] DOB: Jan 26, 1971 DOA: 12/09/2020  PCP: Berniece Pap, FNP  Admit date: 12/09/2020 Discharge date:  12/11/2020  Admitted From: home Disposition:  home Discharging physician: Lewie Chamber, MD  Recommendations for Outpatient Follow-up:  Consider referral to a counselor or psychologist due to significant stressors in life Cymbalta was refilled at discharge Follow up with cardiology; adjust meds as needed  Home Health:  Equipment/Devices:   Patient discharged to home in Discharge Condition: stable Risk of unplanned readmission score: Unplanned Admission- Pilot do not use: 11.02  CODE STATUS: Full Diet recommendation:  Diet Orders (From admission, onward)     Start     Ordered   12/11/20 0000  Diet - low sodium heart healthy        12/11/20 1700   12/10/20 1646  Diet Heart Room service appropriate? Yes; Fluid consistency: Thin  Diet effective now       Question Answer Comment  Room service appropriate? Yes   Fluid consistency: Thin      12/10/20 1645            Hospital Course: [name] is a 51 yo female with PMH hypertension, rheumatoid arthritis, anxiety, tobacco use who presented with chest pain while she was at church.  The pain radiated from her chest to right arm and up to her jaw with associated shortness of breath and diaphoresis. She presented to the ER for further work-up due to this. Initial troponin was 514 which further up trended and peaked at 3284.  EKG was concerning for subtle ST elevation changes.  She was started on a heparin drip and cardiology was consulted.  Cardiac catheterization was recommended Cath showed nonobstructive CAD.  Mid LAD lesion 20% stenosed. Echo was notable for severely reduced EF, 2025% with regional wall motion abnormalities and significant akinesis.  Findings were concerning for underlying stress-induced cardiomyopathy. The patient endorsed that she is under a severe  amount of stress at home and has been unable to manage it well and also has been out of her Cymbalta due to changing of practices with her PCP. Her blood pressure was low/normal which prevented full initiation of GDMT.  She will follow-up with cardiology at discharge for further medication adjustment as needed.  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  Patient has been out of Cymbalta and has significant life stressors going on at home and now having stress-induced cardiomyopathy contributing to MI -Cymbalta refilled - Consider referral to a counselor or psychology for ongoing management outpatient  Tobacco use - Patient was counseled for cessation in the ER on admission  HLD (hyperlipidemia) - LDL 116 - Continue statin  Ascending aortic aneurysm (HCC) - Patient underwent CTA chest due to complaints of her chest pain followed by dedicated CT angio for dissection as there was a 4 cm ascending thoracic aortic aneurysm noted without dissection.  She  is recommended for annual imaging for follow-up  Rheumatoid arthritis (HCC) - On Arava and Orencia at home  Hypertension - On losartan and HCTZ at home   The patient's chronic medical conditions were treated accordingly per the patient's home medication regimen except as noted.  On day of discharge, patient was felt deemed stable for discharge.  Patient/family member advised to call PCP or come back to ER if needed.   Principal Diagnosis: NSTEMI (non-ST elevated myocardial infarction) [facility])  Discharge Diagnoses: Active Hospital Problems   Diagnosis Date Noted   NSTEMI (non-ST elevated myocardial infarction) (HCC) 12/09/2020    Priority: High   Stress-induced cardiomyopathy 12/11/2020    Priority: High   Depression 12/11/2020    Priority: Medium   HLD (hyperlipidemia) 12/10/2020   Tobacco use 12/10/2020   Ascending aortic aneurysm (HCC) 12/09/2020   Tobacco use disorder, continuous 06/14/2019   Rheumatoid arthritis (HCC) 11/22/2015   Hypertension 10/08/2014    Resolved Hospital Problems  No resolved problems to display.    Discharge Instructions     Diet - low sodium heart healthy   Complete by: As directed    Increase activity slowly   Complete by: As directed       Allergies as of 12/11/2020       Reactions   Enbrel [etanercept] Swelling   Humira [adalimumab] Other (See Comments)   kidney dysfunction        Medication List     STOP taking these medications    hydrochlorothiazide 25 MG tablet Commonly known as: HYDRODIURIL   losartan 25 MG tablet Commonly known as: COZAAR   NEOMYCIN-POLYMYXIN-HYDROCORTISONE 1 % Soln OTIC solution Commonly known as: CORTISPORIN   tiZANidine 4 MG tablet Commonly known as: ZANAFLEX       TAKE these medications    acetaminophen 500 MG tablet Commonly known as: TYLENOL Take 500 mg by mouth every 6 (six) hours as needed for moderate pain, mild pain, headache or fever.   aspirin 81 MG EC tablet Take 1 tablet (81 mg total) by mouth daily. Swallow whole. Start taking on: December 12, 2020   atorvastatin 20 MG tablet Commonly known as: LIPITOR Take 1 tablet (20 mg total) by mouth daily. Start taking on: December 12, 2020   DULoxetine 30 MG capsule Commonly known as: CYMBALTA Take 1 capsule (30 mg total) by mouth daily. What changed: how much to take    fluticasone 50 MCG/ACT nasal spray Commonly known as: FLONASE SPRAY 2 SPRAYS INTO EACH NOSTRIL EVERY DAY   furosemide 20 MG tablet Commonly known as: LASIX Take 1 tablet (20 mg total) by mouth daily. Start taking on: December 12, 2020   leflunomide 10 MG tablet Commonly known as: ARAVA TAKE 1 TABLET BY MOUTH ONCE A DAY   meclizine 25 MG tablet Commonly known as: ANTIVERT Take 1 tablet (25 mg total) by mouth 3 (three) times daily as needed for dizziness.   metoprolol succinate 25 MG 24 hr tablet Commonly known as: TOPROL-XL Take 0.5 tablets (12.5 mg total) by mouth daily. Start taking on: December 12, 2020   ondansetron 4 MG disintegrating tablet Commonly known as: Zofran ODT Take 1 tablet (4 mg total) by mouth every 8 (eight) hours as needed for nausea or vomiting.   Orencia ClickJect 125 MG/ML Soaj Generic drug: Abatacept Inject 1 Syringe into the skin once a week.   VITAMIN D PO Take by mouth. 4000IU        Allergies  Allergen Reactions   Enbrel [  Etanercept] Swelling   Humira [Adalimumab] Other (See Comments)    kidney dysfunction    Consultations: Cardiology  Discharge Exam: BP 100/71 (BP Location: Left Arm)   Pulse 76   Temp 98.4 F (36.9 C) (Oral)   Resp 16   Ht  (1.676 m)   Wt 58.5 kg   SpO2 98%   BMI 20.82 kg/m  General appearance: alert, cooperative, and no distress Head: Normocephalic, without obvious abnormality, atraumatic Eyes:  EOMI Lungs: clear to auscultation bilaterally Heart: regular rate and rhythm and S1, S2 normal Abdomen: normal findings: bowel sounds normal and soft, non-tender Extremities:  No edema Skin: mobility and turgor normal Neurologic: Grossly normal  The results of significant diagnostics from this hospitalization (including imaging, microbiology, ancillary and laboratory) are listed below for reference.   Microbiology: Recent Results (from the past 240 hour(s))  Resp Panel by RT-PCR (Flu A&B, Covid)  Nasopharyngeal Swab     Status: None   Collection Time: 12/09/20  2:26 PM   Specimen: Nasopharyngeal Swab; Nasopharyngeal(NP) swabs in vial transport medium  Result Value Ref Range Status   SARS Coronavirus 2 by RT PCR NEGATIVE NEGATIVE Final    Comment: (NOTE) SARS-CoV-2 target nucleic acids are NOT DETECTED.  The SARS-CoV-2 RNA is generally detectable in upper respiratory specimens during the acute phase of infection. The lowest concentration of SARS-CoV-2 viral copies this assay can detect is 138 copies/mL. A negative result does not preclude SARS-Cov-2 infection and should not be used as the sole basis for treatment or other patient management decisions. A negative result may occur with  improper specimen collection/handling, submission of specimen other than nasopharyngeal swab, presence of viral mutation(s) within the areas targeted by this assay, and inadequate number of viral copies(<138 copies/mL). A negative result must be combined with clinical observations, patient history, and epidemiological information. The expected result is Negative.  Fact Sheet for Patients:  BloggerCourse.com  Fact Sheet for Healthcare Providers:  SeriousBroker.it  This test is no t yet approved or cleared by the [place] FDA and  has been authorized for detection and/or diagnosis of SARS-CoV-2 by FDA under an Emergency Use Authorization (EUA). This EUA will remain  in effect (meaning this test can be used) for the duration of the COVID-19 declaration under Section 564(b)(1) of the Act, 21 U.S.C.section 360bbb-3(b)(1), unless the authorization is terminated  or revoked sooner.       Influenza A by PCR NEGATIVE NEGATIVE Final   Influenza B by PCR NEGATIVE NEGATIVE Final    Comment: (NOTE) The Xpert Xpress SARS-CoV-2/FLU/RSV plus assay is intended as an aid in the diagnosis of influenza from Nasopharyngeal swab specimens and should not be  used as a sole basis for treatment. Nasal washings and aspirates are unacceptable for Xpert Xpress SARS-CoV-2/FLU/RSV testing.  Fact Sheet for Patients: BloggerCourse.com  Fact Sheet for Healthcare Providers: SeriousBroker.it  This test is not yet approved or cleared by the [place] FDA and has been authorized for detection and/or diagnosis of SARS-CoV-2 by FDA under an Emergency Use Authorization (EUA). This EUA will remain in effect (meaning this test can be used) for the duration of the COVID-19 declaration under Section 564(b)(1) of the Act, 21 U.S.C. section 360bbb-3(b)(1), unless the authorization is terminated or revoked.  Performed at [facility], [address] [place], [place] [address]      Labs: BNP (last 3 results) No results for input(s): BNP in the last 8760 hours. Basic Metabolic Panel: Recent Labs  Lab 12/09/20  1227 12/10/20 0635 12/11/20 0511  NA 139 139 138  K 3.5 4.7 4.1  CL 105 105 102  CO2 GLUCOSE 138* 115* 110*  BUN CREATININE 0.77 0.87 0.92  CALCIUM 8.7* 8.8* 8.6*  MG  --  2.0 2.0   Liver Function Tests: Recent Labs  Lab 12/09/20 1453 12/10/20 0635  AST 24 36  ALT 13 13  ALKPHOS 72 72  BILITOT 0.6 1.0  PROT 6.3* 6.2*  ALBUMIN 3.6 3.4*   No results for input(s): LIPASE, AMYLASE in the last 168 hours. No results for input(s): AMMONIA in the last 168 hours. CBC: Recent Labs  Lab 12/09/20 1227 12/10/20 0635 12/11/20 0511  WBC 5.9 7.3 5.5  HGB 14.2 13.3 12.9  HCT 40.4 40.2 38.9  MCV 96.7 95.9 97.3  PLT 266 281 257   Cardiac Enzymes: No results for input(s): CKTOTAL, CKMB, CKMBINDEX, TROPONINI in the last 168 hours. BNP: Invalid input(s): POCBNP CBG: No results for input(s): GLUCAP in the last 168 hours. D-Dimer No results for input(s): DDIMER in the last 72 hours. Hgb A1c Recent Labs    12/10/20 0635  HGBA1C 5.7*   Lipid  Profile Recent Labs    12/10/20 0635  CHOL 181  HDL 50  LDLCALC 116*  TRIG 73  CHOLHDL 3.6   Thyroid function studies Recent Labs    12/09/20 1453  TSH 1.159   Anemia work up No results for input(s): VITAMINB12, FOLATE, FERRITIN, TIBC, IRON, RETICCTPCT in the last 72 hours. Urinalysis    Component Value Date/Time   COLORURINE STRAW (A) 05/17/2015 1802   APPEARANCEUR CLEAR (A) 05/17/2015 1802   LABSPEC 1.008 05/17/2015 1802   PHURINE 7.0 05/17/2015 1802   GLUCOSEU NEGATIVE 05/17/2015 1802   HGBUR NEGATIVE 05/17/2015 1802   BILIRUBINUR negative 05/10/2019 1035   KETONESUR NEGATIVE 05/17/2015 1802   PROTEINUR Negative 05/10/2019 1035   PROTEINUR NEGATIVE 05/17/2015 1802   UROBILINOGEN 0.2 05/10/2019 1035   NITRITE negative 05/10/2019 1035   NITRITE NEGATIVE 05/17/2015 1802   LEUKOCYTESUR Negative 05/10/2019 1035   Sepsis Labs Invalid input(s): PROCALCITONIN,  WBC,  LACTICIDVEN Microbiology Recent Results (from the past 240 hour(s))  Resp Panel by RT-PCR (Flu A&B, Covid) Nasopharyngeal Swab     Status: None   Collection Time: 12/09/20  2:26 PM   Specimen: Nasopharyngeal Swab; Nasopharyngeal(NP) swabs in vial transport medium  Result Value Ref Range Status   SARS Coronavirus 2 by RT PCR NEGATIVE NEGATIVE Final    Comment: (NOTE) SARS-CoV-2 target nucleic acids are NOT DETECTED.  The SARS-CoV-2 RNA is generally detectable in upper respiratory specimens during the acute phase of infection. The lowest concentration of SARS-CoV-2 viral copies this assay can detect is 138 copies/mL. A negative result does not preclude SARS-Cov-2 infection and should not be used as the sole basis for treatment or other patient management decisions. A negative result may occur with  improper specimen collection/handling, submission of specimen other than nasopharyngeal swab, presence of viral mutation(s) within the areas targeted by this assay, and inadequate number of viral copies(<138  copies/mL). A negative result must be combined with clinical observations, patient history, and epidemiological information. The expected result is Negative.  Fact Sheet for Patients:  BloggerCourse.com  Fact Sheet for Healthcare Providers:  SeriousBroker.it  This test is no t yet approved or cleared by the [place] FDA and  has been authorized for detection and/or diagnosis of SARS-CoV-2 by FDA under an Emergency Use Authorization (EUA).  This EUA will remain  in effect (meaning this test can be used) for the duration of the COVID-19 declaration under Section 564(b)(1) of the Act, 21 U.S.C.section 360bbb-3(b)(1), unless the authorization is terminated  or revoked sooner.       Influenza A by PCR NEGATIVE NEGATIVE Final   Influenza B by PCR NEGATIVE NEGATIVE Final    Comment: (NOTE) The Xpert Xpress SARS-CoV-2/FLU/RSV plus assay is intended as an aid in the diagnosis of influenza from Nasopharyngeal swab specimens and should not be used as a sole basis for treatment. Nasal washings and aspirates are unacceptable for Xpert Xpress SARS-CoV-2/FLU/RSV testing.  Fact Sheet for Patients: BloggerCourse.com  Fact Sheet for Healthcare Providers: SeriousBroker.it  This test is not yet approved or cleared by the [place] FDA and has been authorized for detection and/or diagnosis of SARS-CoV-2 by FDA under an Emergency Use Authorization (EUA). This EUA will remain in effect (meaning this test can be used) for the duration of the COVID-19 declaration under Section 564(b)(1) of the Act, 21 U.S.C. section 360bbb-3(b)(1), unless the authorization is terminated or revoked.  Performed at [facility], [address] [place], [place] [address]     Procedures/Studies: DG Chest 2 View  Result Date: 12/09/2020 CLINICAL DATA:  Chest pain radiating to right neck and jaw.  EXAM: CHEST - 2 VIEW COMPARISON:  05/15/2020 FINDINGS: The heart size and mediastinal contours are within normal limits. Both lungs are clear. The visualized skeletal structures are unremarkable. IMPRESSION: No active cardiopulmonary disease. Electronically Signed   By: Danae Orleans M.D.   On: 12/09/2020 13:25   CT Angio Chest PE W and/or Wo Contrast  Addendum Date: 12/09/2020   ADDENDUM REPORT: 12/09/2020 14:31 ADDENDUM: When measured in the coronal plane the aortic caliber is closer to 3.8 cm still more dilated than expected but difficult to evaluate again due to lack of opacification and motion related artifact. The aortic root is approximately 3 cm. As stated previously exclusion of aortic pathology with CT angiography may be helpful given above findings. Electronically Signed   By: Donzetta Kohut M.D.   On: 12/09/2020 14:31   Result Date: 12/09/2020 CLINICAL DATA:  Suspected pulmonary embolism.  Chest pain. EXAM: CT ANGIOGRAPHY CHEST WITH CONTRAST TECHNIQUE: Multidetector CT imaging of the chest was performed using the standard protocol during bolus administration of intravenous contrast. Multiplanar CT image reconstructions and MIPs were obtained to evaluate the vascular anatomy. CONTRAST:  75mL OMNIPAQUE IOHEXOL 350 MG/ML SOLN COMPARISON:  No cross-sectional comparison is available. FINDINGS: Cardiovascular: 4 cm dilation of the ascending thoracic aorta. Descending thoracic aorta at 2.4 cm. The aorta is not opacified and not well assessed on the current study. Heart size is normal without pericardial effusion. Main pulmonary artery is well opacified mean density of 748 Hounsfield units. No evidence of pulmonary embolism. Mediastinum/Nodes: No axillary lymphadenopathy. No mediastinal adenopathy. No hilar lymphadenopathy. Lungs/Pleura: No consolidation, pneumothorax or pleural effusion. Mild biapical scarring. Mild pulmonary emphysema mainly paraseptal. Upper Abdomen: Incidental imaging of upper abdominal  contents without signs of acute process. Musculoskeletal: No acute musculoskeletal finding. Spinal degenerative changes. Review of the MIP images confirms the above findings. IMPRESSION: 1. No evidence of pulmonary embolism. 2. 4 cm dilation of the ascending thoracic aorta. The aorta is not opacified and not well assessed on the current study. This meets criteria for mild aneurysmal dilation and is seen in the absence of aortic atherosclerosis. Given configuration would consider the possibility of bicuspid aortic valve as a cause for  aortic dilation. The aorta is not assessed on the current study due to lack of opacification. Given symptoms of chest pain and abnormal dilation of the aorta, CT angiography may be helpful to exclude the possibility of dissection. 3. Mild pulmonary emphysema mainly paraseptal. 4. Emphysema and aortic atherosclerosis. Aortic Atherosclerosis (ICD10-I70.0) and Emphysema (ICD10-J43.9). These results were called by telephone at the time of interpretation on 12/09/2020 at 2:24 pm to provider [facility] , who verbally acknowledged these results. Electronically Signed: By: Donzetta Kohut M.D. On: 12/09/2020 14:27   CARDIAC CATHETERIZATION  Result Date: 12/10/2020   Mid LAD lesion is 20% stenosed. 1.  Mild nonobstructive coronary artery disease.  Mild mid LAD myocardial bridge. 2.  Left ventricular angiography was not performed.  EF was severely reduced by echo. 3.  Severely elevated left ventricular end-diastolic pressure at 32 mmHg. Recommendations: The patient's presentation and echocardiogram findings are consistent with stress-induced cardiomyopathy. She is volume overloaded.  I started small dose furosemide and small dose carvedilol.  If blood pressure tolerates, start a small dose ACE inhibitor or ARB tomorrow.  Her blood pressure is too low to tolerate Entresto.   ECHOCARDIOGRAM COMPLETE  Result Date: 12/10/2020    ECHOCARDIOGRAM REPORT   Patient Name:   Isabella Burch Date of  Exam: 12/10/2020 Medical Rec #:  [id number]     Height:       66.0 in Accession #:    [id number]    Weight:       135.0 lb Date of Birth:  [birth year]     BSA:          1.692 m Patient Age:    49 years      BP:           95/73 mmHg Patient Gender: F             HR:           89 bpm. Exam Location:  [facility] Procedure: 2D Echo, Color Doppler, Cardiac Doppler and Strain Analysis Indications:     R07.9 Chest Pain  History:         Patient has no prior history of Echocardiogram examinations.                  Signs/Symptoms:Chest Pain; Risk Factors:Hypertension and                  Current Smoker.  Sonographer:     Humphrey Rolls Referring Phys:  [id number] Eliezer Mccoy PATEL Diagnosing Phys: Lorine Bears MD  Sonographer Comments: Suboptimal subcostal window. Global longitudinal strain was attempted. IMPRESSIONS  1. Left ventricular ejection fraction, by estimation, is 20 to 25%. The left ventricle has severely decreased function. The left ventricle demonstrates regional wall motion abnormalities (see scoring diagram/findings for description). There is akinesis of the left ventricular, mid-apical anterior wall, inferior wall and apical segment. The average left ventricular global longitudinal strain is -8.5 %. The global longitudinal strain is abnormal. Wall motion is highly suggestive of stress induced cardiomyopathy.  2. Right ventricular systolic function is normal. The right ventricular size is normal. Tricuspid regurgitation signal is inadequate for assessing PA pressure.  3. The mitral valve is normal in structure. Mild mitral valve regurgitation. No evidence of mitral stenosis.  4. The aortic valve is normal in structure. Aortic valve regurgitation is not visualized. No aortic stenosis is present.  5. The inferior vena cava is normal in size with greater than 50% respiratory variability, suggesting right atrial pressure of  3 mmHg. FINDINGS  Left Ventricle: Left ventricular ejection fraction, by estimation, is 20 to 25%. The left  ventricle has severely decreased function. The left ventricle demonstrates regional wall motion abnormalities. The average left ventricular global longitudinal strain is -8.5 %. The global longitudinal strain is abnormal. The left ventricular internal cavity size was normal in size. There is no left ventricular hypertrophy. Left ventricular diastolic parameters are indeterminate. Right Ventricle: The right ventricular size is normal. No increase in right ventricular wall thickness. Right ventricular systolic function is normal. Tricuspid regurgitation signal is inadequate for assessing PA pressure. The tricuspid regurgitant velocity is 2.45 m/s, and with an assumed right atrial pressure of 5 mmHg, the estimated right ventricular systolic pressure is 29.0 mmHg. Left Atrium: Left atrial size was normal in size. Right Atrium: Right atrial size was normal in size. Pericardium: There is no evidence of pericardial effusion. Mitral Valve: The mitral valve is normal in structure. Mild mitral valve regurgitation. No evidence of mitral valve stenosis. MV peak gradient, 2.5 mmHg. The mean mitral valve gradient is 1.0 mmHg. Tricuspid Valve: The tricuspid valve is normal in structure. Tricuspid valve regurgitation is trivial. No evidence of tricuspid stenosis. Aortic Valve: The aortic valve is normal in structure. Aortic valve regurgitation is not visualized. No aortic stenosis is present. Aortic valve mean gradient measures 2.0 mmHg. Aortic valve peak gradient measures 4.2 mmHg. Aortic valve area, by VTI measures 2.00 cm. Pulmonic Valve: The pulmonic valve was normal in structure. Pulmonic valve regurgitation is not visualized. No evidence of pulmonic stenosis. Aorta: The aortic root is normal in size and structure. Venous: The inferior vena cava is normal in size with greater than 50% respiratory variability, suggesting right atrial pressure of 3 mmHg. IAS/Shunts: No atrial level shunt detected by color flow Doppler.  LEFT  VENTRICLE PLAX 2D LVIDd:         3.80 cm  Diastology LVIDs:         3.00 cm  LV e' medial:    5.98 cm/s LV PW:         1.00 cm  LV E/e' medial:  13.2 LV IVS:        1.00 cm  LV e' lateral:   6.96 cm/s LVOT diam:     1.80 cm  LV E/e' lateral: 11.3 LV SV:         31 LV SV Index:   18       2D Longitudinal Strain LVOT Area:     2.54 cm 2D Strain GLS Avg:     -8.5 %  RIGHT VENTRICLE RV Basal diam:  1.90 cm LEFT ATRIUM             Index       RIGHT ATRIUM          Index LA diam:        3.30 cm 1.95 cm/m  RA Area:     5.27 cm LA Vol (A2C):   34.0 ml 20.09 ml/m RA Volume:   8.67 ml  5.12 ml/m LA Vol (A4C):   39.0 ml 23.05 ml/m LA Biplane Vol: 37.0 ml 21.87 ml/m  AORTIC VALVE                   PULMONIC VALVE AV Area (Vmax):    1.86 cm    PV Vmax:       0.75 m/s AV Area (Vmean):   1.77 cm    PV Vmean:      57.400  cm/s AV Area (VTI):     2.00 cm    PV VTI:        0.112 m AV Vmax:           103.00 cm/s PV Peak grad:  2.3 mmHg AV Vmean:          68.200 cm/s PV Mean grad:  1.0 mmHg AV VTI:            0.153 m AV Peak Grad:      4.2 mmHg AV Mean Grad:      2.0 mmHg LVOT Vmax:         75.10 cm/s LVOT Vmean:        47.400 cm/s LVOT VTI:          0.120 m LVOT/AV VTI ratio: 0.78  AORTA Ao Root diam: 2.60 cm MITRAL VALVE               TRICUSPID VALVE MV Area (PHT): 7.29 cm    TR Peak grad:   24.0 mmHg MV Area VTI:   2.36 cm    TR Vmax:        245.00 cm/s MV Peak grad:  2.5 mmHg MV Mean grad:  1.0 mmHg    SHUNTS MV Vmax:       0.79 m/s    Systemic VTI:  0.12 m MV Vmean:      53.4 cm/s   Systemic Diam: 1.80 cm MV Decel Time: 104 msec MV E velocity: 78.80 cm/s MV A velocity: 55.30 cm/s MV E/A ratio:  1.42 Lorine Bears MD Electronically signed by Lorine Bears MD Signature Date/Time: 12/10/2020/4:57:31 PM    Final    CT Angio Chest/Abd/Pel for Dissection W and/or Wo Contrast  Result Date: 12/09/2020 CLINICAL DATA:  Chest pressure into  right neck. EXAM: CT ANGIOGRAPHY CHEST, ABDOMEN AND PELVIS TECHNIQUE: Multidetector CT  imaging through the chest, abdomen and pelvis was performed using the standard protocol during bolus administration of intravenous contrast. Multiplanar reconstructed images and MIPs were obtained and reviewed to evaluate the vascular anatomy. CONTRAST:  24mL OMNIPAQUE IOHEXOL 350 MG/ML SOLN COMPARISON:  09/30/2009 FINDINGS: CTA CHEST FINDINGS Cardiovascular: Heart size normal. No pericardial effusion. Satisfactory opacification of pulmonary arteries noted, and there is no evidence of pulmonary emboli. Good contrast opacification of the thoracic aorta. No dissection or stenosis. Bovine variant brachiocephalic arterial origin anatomy without proximal stenosis. Aortic Root: --Valve: 2.1 cm --Sinuses: 2.9 cm --Sinotubular Junction: 2.7 cm Limitations by motion: Moderate Thoracic Aorta: --Ascending Aorta: 4 cm --Aortic Arch: 3.7 cm --Descending Aorta: 2.8 cm Mediastinum/Nodes: No hematoma.  No mass or adenopathy. Lungs/Pleura: No pleural effusion. No pneumothorax. Paraseptal emphysema most evident in the upper lobes. Lungs otherwise clear. Musculoskeletal: No chest wall abnormality. No acute or significant osseous findings. Review of the MIP images confirms the above findings. CTA ABDOMEN AND PELVIS FINDINGS VASCULAR Aorta: Minimal calcified plaque. No aneurysm, dissection, or stenosis. Celiac: Patent without evidence of aneurysm, dissection, vasculitis or significant stenosis. SMA: Patent without evidence of aneurysm, dissection, vasculitis or significant stenosis. Replaced right hepatic arterial supply, an anatomic variant. Renals: Both renal arteries are patent without evidence of aneurysm, dissection, vasculitis, fibromuscular dysplasia or significant stenosis. IMA: Patent without evidence of aneurysm, dissection, vasculitis or significant stenosis. Inflow: Patent without evidence of aneurysm, dissection, vasculitis or significant stenosis. Veins: No obvious venous abnormality within the limitations of this arterial  phase study. Review of the MIP images confirms the above findings. NON-VASCULAR Hepatobiliary: No focal liver abnormality is seen.  No gallstones, gallbladder wall thickening, or biliary dilatation. Pancreas: Unremarkable. No pancreatic ductal dilatation or surrounding inflammatory changes. Spleen: Normal in size without focal abnormality. Adrenals/Urinary Tract: Adrenal glands are unremarkable. Kidneys are normal, without renal calculi, focal lesion, or hydronephrosis. Bladder is unremarkable. Stomach/Bowel: Stomach decompressed. Distal duodenal diverticulum. Jejunum and ileum decompressed. Normal appendix. The colon is nondilated, unremarkable. Lymphatic: No abdominal or pelvic adenopathy. Reproductive: Uterus and bilateral adnexa are unremarkable. Other: No ascites.  No free air. Musculoskeletal: No acute or significant osseous findings. Review of the MIP images confirms the above findings. IMPRESSION: 1. No acute findings. 2. 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 Recommend annual imaging followup by CTA or MRA. This recommendation follows 2010 ACCF/AHA/AATS/ACR/ASA/SCA/SCAI/SIR/STS/SVM Guidelines for the Diagnosis and Management of Patients with Thoracic Aortic Disease. Circulation. 2010; 121: Z610-R604 3.  Emphysema (ICD10-J43.9). Electronically Signed   By: Corlis Leak M.D.   On: 12/09/2020 14:58     Time coordinating discharge: Over 30 minutes    Lewie Chamber, MD  [facility] 12/11/2020, 5:00 PM

## 2020-12-11 NOTE — Progress Notes (Addendum)
Patient was tearful during assessment. Pt expressed concerns that provider advised her to "control stress".  Pt reports that she is the primary caregiver for her husband who has some sort of "dementia or encephalopathy".  Pt reports that husband is an alcoholic, however he forgets things a lot. Pt reports that she has to work multiple shifts to help cover living expenses, as husband is not able to work.  Pt states she takes Cymbalta for depression/anxiety; but her prescription ran out about a week ago. She states that her PCP changed practices and she decided to follow that provider and then provider was out on personal leave.  She has not filled that Rx or some others due to not being able to meet with PCP for appointments.    This RN asked patient if she has respite care to help with husband, however she stated she did not.  She states she tried to see about getting disability for her husband, however has been unsuccessful.   Patient became more emotional and expressed desire to stop talking about the subject and expressed "I am not sure why I am so emotional about this"

## 2020-12-12 ENCOUNTER — Other Ambulatory Visit: Payer: Self-pay | Admitting: Family

## 2020-12-12 MED ORDER — DULOXETINE HCL 30 MG PO CPEP: 30.0000 mg | ORAL_CAPSULE | Freq: Every day | ORAL | 2 refills | Status: DC

## 2020-12-15 ENCOUNTER — Other Ambulatory Visit: Payer: Self-pay | Admitting: Family

## 2020-12-20 ENCOUNTER — Ambulatory Visit: Payer: Managed Care, Other (non HMO) | Admitting: Nurse Practitioner

## 2020-12-20 ENCOUNTER — Encounter: Payer: Self-pay | Admitting: Nurse Practitioner

## 2020-12-20 ENCOUNTER — Other Ambulatory Visit: Payer: Self-pay

## 2020-12-20 VITALS — BP 116/72 | HR 56 | Ht 65.0 in | Wt 130.5 lb

## 2020-12-20 DIAGNOSIS — I712 Thoracic aortic aneurysm, without rupture: Secondary | ICD-10-CM

## 2020-12-20 DIAGNOSIS — I214 Non-ST elevation (NSTEMI) myocardial infarction: Secondary | ICD-10-CM | POA: Diagnosis not present

## 2020-12-20 DIAGNOSIS — I5181 Takotsubo syndrome: Secondary | ICD-10-CM | POA: Diagnosis not present

## 2020-12-20 DIAGNOSIS — E782 Mixed hyperlipidemia: Secondary | ICD-10-CM | POA: Diagnosis not present

## 2020-12-20 DIAGNOSIS — I10 Essential (primary) hypertension: Secondary | ICD-10-CM

## 2020-12-20 DIAGNOSIS — I7121 Aneurysm of the ascending aorta, without rupture: Secondary | ICD-10-CM

## 2020-12-20 DIAGNOSIS — Z720 Tobacco use: Secondary | ICD-10-CM

## 2020-12-20 DIAGNOSIS — I2510 Atherosclerotic heart disease of native coronary artery without angina pectoris: Secondary | ICD-10-CM

## 2020-12-20 MED ORDER — LISINOPRIL 2.5 MG PO TABS: 2.5000 mg | ORAL_TABLET | Freq: Every day | ORAL | 6 refills | Status: DC

## 2020-12-20 NOTE — Patient Instructions (Signed)
Medication Instructions:  - Your physician has recommended you make the following change in your medication:   1) START lisinopril 2.5 mg- take 1 tablet by mouth once daily   *If you need a refill on your cardiac medications before your next appointment, please call your pharmacy*   Lab Work: - Your physician recommends that you have lab work today: BMP  If you have labs (blood work) drawn today and your tests are completely normal, you will receive your results only by: MyChart Message (if you have MyChart) OR A paper copy in the mail If you have any lab test that is abnormal or we need to change your treatment, we will call you to review the results.   Testing/Procedures: - none ordered   Follow-Up: At [facility], you and your health needs are our priority.  As part of our continuing mission to provide you with exceptional heart care, we have created designated Provider Care Teams.  These Care Teams include your primary Cardiologist (physician) and Advanced Practice Providers (APPs -  Physician Assistants and Nurse Practitioners) who all work together to provide you with the care you need, when you need it.  We recommend signing up for the patient portal called "MyChart".  Sign up information is provided on this After Visit Summary.  MyChart is used to connect with patients for Virtual Visits (Telemedicine).  Patients are able to view lab/test results, encounter notes, upcoming appointments, etc.  Non-urgent messages can be sent to your provider as well.   To learn more about what you can do with MyChart, go to [email or web address].    Your next appointment:   1 month(s)  (Please cancel her Heart Failure appointment for tomorrow- not needed per Ward Givens, NP)  The format for your next appointment:   In Person  Provider:   You may see Lorine Bears, MD or one of the following Advanced Practice Providers on your designated Care Team:   Nicolasa Ducking, NP    Other  Instructions  Lisinopril Tablets What is this medication? LISINOPRIL (lyse IN oh pril) treats high blood pressure and heart failure. It may also be used to prevent further damage after a heart attack. It works by relaxing blood vessels, which decreases the amount of work the heart has to do. It belongs to a group of medications called ACE inhibitors. This medicine may be used for other purposes; ask your health care provider or pharmacist if you have questions. COMMON BRAND NAME(S): Prinivil, Zestril What should I tell my care team before I take this medication? They need to know if you have any of these conditions: Diabetes Heart or blood vessel disease History of swelling of the tongue, face, or lips with difficulty breathing, difficulty swallowing, hoarseness, or tightening of the throat (angioedema) Kidney disease Low blood pressure An unusual or allergic reaction to lisinopril, other ACE inhibitors, insect venom, foods, dyes, or preservatives Pregnant or trying to get pregnant Breast-feeding How should I use this medication? Take this medication by mouth. Take it as directed on the prescription label at the same time every day. You can take it with or without food. If it upsets your stomach, take it with food. Keep taking it unless your care team tells you to stop. Talk to your care team about the use of this medication in children. While it may be prescribed for children as young as 6 for selected conditions, precautions do apply. Overdosage: If you think you have taken too much of this medicine  contact a poison control center or emergency room at once. NOTE: This medicine is only for you. Do not share this medicine with others. What if I miss a dose? If you miss a dose, take it as soon as you can. If it is almost time for your next dose, take only that dose. Do not take double or extra doses. What may interact with this medication? Do not take this medication with any of the  following: Hymenoptera venom Sacubitril; valsartan This medication may also interact with the following: Aliskiren Angiotensin receptor blockers, like losartan or valsartan Certain medications for diabetes Diuretics Everolimus Gold compounds Lithium NSAIDs, medications for pain and inflammation, like ibuprofen or naproxen Potassium salts or supplements Salt substitutes Sirolimus Temsirolimus This list may not describe all possible interactions. Give your health care provider a list of all the medicines, herbs, non-prescription drugs, or dietary supplements you use. Also tell them if you smoke, drink alcohol, or use illegal drugs. Some items may interact with your medicine. What should I watch for while using this medication? Visit your health care provider for regular check-ups. Check your blood pressure as directed. Ask your health care provider what your blood pressure should be. Also, find out when you should contact him or her. Do not treat yourself for coughs, colds, or pain while you are using this medication without asking your health care provider for advice. Some medications may increase your blood pressure. Inform your health care provider if you wish to become pregnant or think you might be pregnant. There is a potential for serious side effects to an unborn child. Talk to your health care provider for more information. You may get drowsy or dizzy. Do not drive, use machinery, or do anything that needs mental alertness until you know how this medication affects you. Do not stand or sit up quickly, especially if you are an older patient. This reduces the risk of dizzy or fainting spells. Alcohol can make you more drowsy and dizzy. Avoid alcoholic drinks. Avoid salt substitutes unless you are told otherwise by your health care provider. What side effects may I notice from receiving this medication? Side effects that you should report to your care team as soon as possible: Allergic  reactions or angioedema-skin rash, itching, hives, swelling of the face, eyes, lips, tongue, arms, or legs, trouble swallowing or breathing High potassium level-muscle weakness, fast or irregular heartbeat Kidney injury-decrease in the amount of urine, swelling of the ankles, hands, or feet Liver injury-right upper belly pain, loss of appetite, nausea, light-colored stool, dark yellow or brown urine, yellowing skin or eyes, unusual weakness, fatigue Low blood pressure-dizziness, feeling faint or lightheaded, blurry vision Side effects that usually do not require medical attention (report to your care team if they continue or are bothersome): Cough Dizziness Headache This list may not describe all possible side effects. Call your doctor for medical advice about side effects. You may report side effects to FDA at 1-800-FDA-1088. Where should I keep my medication? Keep out of the reach of children and pets. Store at room temperature between 20 and 25 degrees C (68 and 77 degrees F). Protect from moisture. Keep the container tightly closed. Do not freeze. Avoid exposure to extreme heat. Get rid of any unused medication after the expiration date. To get rid of medications that are no longer needed or have expired: Take the medication to a medication take-back program. Check with your pharmacy or law enforcement to find a location. If you cannot return the  medication, check the label or package insert to see if the medication should be thrown out in the garbage or flushed down the toilet. If you are not sure, ask your care team. If it is safe to put in the trash, empty the medication out of the container. Mix the medication with cat litter, dirt, coffee grounds, or other unwanted substance. Seal the mixture in a bag or container. Put it in the trash. NOTE: This sheet is a summary. It may not cover all possible information. If you have questions about this medicine, talk to your doctor, pharmacist, or  health care provider.  2022 Elsevier/Gold Standard (2020-03-03 14:57:12)

## 2020-12-20 NOTE — Progress Notes (Signed)
Office Visit    Patient Name: Isabella Burch Date of Encounter: 12/20/2020  Primary Care Provider:  Berniece Pap, FNP Primary Cardiologist:  Lorine Bears, MD  Chief Complaint    50 year old female with a history of hypertension, anxiety, rheumatoid arthritis, tobacco abuse, who presents for follow-up after recent admission for non-STEMI and finding of Takotsubo cardiomyopathy.  Past Medical History    Past Medical History:  Diagnosis Date   Anxiety    Ascending aorta dilatation (HCC)    a. 11/2020 CTA chest: 3.8cm asc Ao.   Extensor tendon rupture, non-traumatic, hand and wrist    right   Heart murmur    History of kidney stones    Hypertension    Nonobstructive CAD (coronary artery disease)    a. 11/2020 NSTEMI/Cath: LM nl, LAD 88m, D1/2 nl, LCX min irregs, RCA nl, RPDA/RPAV nl.   Rheumatoid arthritis (HCC) dx 2006   BILATERAL HANDS AND RIGHT ANKLE   Takotsubo cardiomyopathy    a. 11/2020 Echo: EF 20-25%, mid-apical ant, inf, and apical AK. Nl RV size/fxn. Mild MR; b. 11/2020 Cath: nonobs dzs.   Tobacco abuse    Past Surgical History:  Procedure Laterality Date   CARDIAC VALVE REPLACEMENT     CYSTOSCOPY WITH URETEROSCOPY, STONE BASKETRY AND STENT PLACEMENT  2011   LEFT HEART CATH AND CORONARY ANGIOGRAPHY N/A 12/10/2020   Procedure: LEFT HEART CATH AND CORONARY ANGIOGRAPHY;  Surgeon: Iran Ouch, MD;  Location: [facility] INVASIVE CV LAB;  Service: Cardiovascular;  Laterality: N/A;   LEFT WRIST EXTENSOR TENDON REPAIR     TENDON TRANSFER Right 11/09/2014   Procedure: RIGHT THUMB EIP TO EPL TENDON TRANSFER;  Surgeon: Bradly Bienenstock, MD;  Location: [facility] [place];  Service: Orthopedics;  Laterality: Right;   TUBAL LIGATION      Allergies  Allergies  Allergen Reactions   Enbrel [Etanercept] Swelling   Humira [Adalimumab] Other (See Comments)    kidney dysfunction    History of Present Illness    50 year old female with the above past medical history  including hypertension, anxiety, rheumatoid arthritis, and tobacco abuse.  On August 22, while at church, she had sudden onset of substernal chest heaviness with radiation to the jaw, associate with diaphoresis, nausea, and dyspnea.  EMS was called and she was given 4 baby aspirin with improvement in chest pressure but persistence of jaw pain.  In the [facility] ED, ECG showed sinus rhythm with left atrial enlargement, prior anteroseptal infarct and 1 mm ST segment elevation in V1 and V2 with more subtle ST elevation in V3 through V6.  CT angio of the chest was negative for PE, though she was incidentally noted to have mild dilation of the ascending aorta at 3.8 cm.  Initial troponin was 514 and this subsequently rose to 3284.  Echocardiogram showed an EF of 20 to 25% with mid-apical anterior, inferior, and apical akinesis consistent with a stress-induced cardiomyopathy.  Diagnostic catheterization showed minimal nonobstructive disease, and medical therapy was recommended.  She did require diuresis.  Blood pressures were soft and medical therapy was limited to metoprolol succinate 12.5 mg daily.  She was discharged home August 23.  Since discharge, she has done well.  She denies chest pain, dyspnea, palpitations, PND, orthopnea, dizziness, syncope, edema, or early satiety.  She has not smoked since her hospitalization.  Thus far, she has been tolerating low-dose Lasix and metoprolol.  Home Medications    Current Outpatient Medications  Medication Sig Dispense Refill  acetaminophen (TYLENOL) 500 MG tablet Take 500 mg by mouth every 6 (six) hours as needed for moderate pain, mild pain, headache or fever.     aspirin EC 81 MG EC tablet Take 1 tablet (81 mg total) by mouth daily. Swallow whole. 30 tablet 11   atorvastatin (LIPITOR) 20 MG tablet Take 1 tablet (20 mg total) by mouth daily. 30 tablet 3   DULoxetine (CYMBALTA) 30 MG capsule Take 1 capsule (30 mg total) by mouth daily. 30 capsule 2   fluticasone  (FLONASE) 50 MCG/ACT nasal spray SPRAY 2 SPRAYS INTO EACH NOSTRIL EVERY DAY 16 mL 1   furosemide (LASIX) 20 MG tablet Take 1 tablet (20 mg total) by mouth daily. 30 tablet 3   leflunomide (ARAVA) 10 MG tablet TAKE 1 TABLET BY MOUTH ONCE A DAY     meclizine (ANTIVERT) 25 MG tablet Take 1 tablet (25 mg total) by mouth 3 (three) times daily as needed for dizziness. 30 tablet 0   metoprolol succinate (TOPROL-XL) 25 MG 24 hr tablet Take 0.5 tablets (12.5 mg total) by mouth daily. 30 tablet 3   ondansetron (ZOFRAN ODT) 4 MG disintegrating tablet Take 1 tablet (4 mg total) by mouth every 8 (eight) hours as needed for nausea or vomiting. 20 tablet 0   ORENCIA CLICKJECT 125 MG/ML SOAJ Inject 1 Syringe into the skin once a week.     VITAMIN D PO Take by mouth. 4000IU     No current facility-administered medications for this visit.     Review of Systems    Doing well.  She denies chest pain, palpitations, dyspnea, pnd, orthopnea, n, v, dizziness, syncope, edema, weight gain, or early satiety.  All other systems reviewed and are otherwise negative except as noted above.  Physical Exam    VS:  BP 116/72 (BP Location: Left Arm, Patient Position: Sitting, Cuff Size: Normal)   Pulse (!) 56   Ht 5\' 5"  (1.651 m)   Wt 130 lb 8 oz (59.2 kg)   SpO2 98%   BMI 21.72 kg/m  , BMI Body mass index is 21.72 kg/m.     GEN: Well nourished, well developed, in no acute distress. HEENT: normal. Neck: Supple, no JVD, carotid bruits, or masses. Cardiac: RRR, no murmurs, rubs, or gallops. No clubbing, cyanosis, edema.  Radials/DP/PT 2+ and equal bilaterally.  Right radial catheterization site without bleeding, bruit, or hematoma. Respiratory:  Respirations regular and unlabored, clear to auscultation bilaterally. GI: Soft, nontender, nondistended, BS + x 4. MS: no deformity or atrophy. Skin: warm and dry, no rash. Neuro:  Strength and sensation are intact. Psych: Normal affect.  Accessory Clinical Findings     ECG personally reviewed by me today -sinus bradycardia, 56, right atrial enlargement, inferior and anterolateral deep T wave inversion, which is new since hospitalization  Lab Results  Component Value Date   WBC 5.5 12/11/2020   HGB 12.9 12/11/2020   HCT 38.9 12/11/2020   MCV 97.3 12/11/2020   PLT 257 12/11/2020   Lab Results  Component Value Date   CREATININE 0.92 12/11/2020   BUN 14 12/11/2020   NA 138 12/11/2020   K 4.1 12/11/2020   CL 102 12/11/2020   CO2 29 12/11/2020   Lab Results  Component Value Date   ALT 13 12/10/2020   AST 36 12/10/2020   ALKPHOS 72 12/10/2020   BILITOT 1.0 12/10/2020   Lab Results  Component Value Date   CHOL 181 12/10/2020   HDL 50 12/10/2020  LDLCALC 116 (H) 12/10/2020   TRIG 73 12/10/2020   CHOLHDL 3.6 12/10/2020    Lab Results  Component Value Date   HGBA1C 5.7 (H) 12/10/2020    Assessment & Plan    1.  Stress-induced cardiomyopathy/chronic HFrEF: Patient recently hospitalized with chest pain and troponin elevation.  She was found to have an EF of 20 to 25% with finding suggestive of stress-induced/Takotsubo cardiomyopathy.  Cath showed no significant coronary disease, and she has been medically managed.  Blood pressures were soft during hospitalization limiting medical therapy to low-dose Toprol-XL and Lasix.  She has been feeling well without chest pain or dyspnea and is euvolemic on examination today.  Blood pressure stable at 116/72.  I am going to follow-up basic metabolic panel and will plan to add lisinopril 2.5 mg daily (previously did not tolerate losartan secondary to abdominal discomfort).  We will likely follow-up labs in the next 2 weeks following ACE inhibitor initiation and office follow-up in a month.  Plan for follow-up echo in approximately 40 days.  We discussed the importance of daily weights, sodium restriction, medication compliance, and symptom reporting and she verbalizes understanding.   2.  Nonobstructive  coronary disease/non-STEMI, subsequent episode of care: Status post catheterization in the setting of troponin elevation, with minimal LAD disease and only minor irregularities in the left circumflex.  She has not any chest pain.  She remains on low-dose aspirin, statin, and beta-blocker therapy.  3.  Essential hypertension: Previously on losartan HCTZ but had soft blood pressures during hospitalization limiting medical therapy to Toprol-XL 12.5 mg daily.  We will add low-dose ACE inhibitor therapy today to optimize medical therapy for HFrEF.  4.  Hyperlipidemia: LDL of 116 on August 22.  She is now on a statin.  We will plan to follow-up lipids after next visit.  5.  Tobacco abuse: She has not smoked since discharge.  I congratulated her on this and encouraged her to remain off cigarettes.  6.  Rheumatoid arthritis: Followed by rheumatology.  7.  Depression and anxiety: Patient reports significant stress at home prior to hospitalization.  She is on Cymbalta follow-up with primary care.  8.  Dilated ascending aorta: CTA of the chest initially read as 4 cm ascending aorta with addendum showing 3.8 cm.  Plan follow-up in a year.  Blood pressure is well controlled.  9.  Disposition: Follow-up basic metabolic panel today.  Follow-up in clinic in 1 month or sooner if necessary.  Plan repeat echo in approximately 40 days.    Nicolasa Ducking, NP 12/20/2020, 3:38 PM

## 2020-12-21 ENCOUNTER — Telehealth: Payer: Self-pay | Admitting: Family

## 2020-12-21 ENCOUNTER — Telehealth: Payer: Self-pay | Admitting: *Deleted

## 2020-12-21 ENCOUNTER — Ambulatory Visit: Payer: Managed Care, Other (non HMO) | Admitting: Family

## 2020-12-21 DIAGNOSIS — Z79899 Other long term (current) drug therapy: Secondary | ICD-10-CM

## 2020-12-21 DIAGNOSIS — I214 Non-ST elevation (NSTEMI) myocardial infarction: Secondary | ICD-10-CM

## 2020-12-21 DIAGNOSIS — I10 Essential (primary) hypertension: Secondary | ICD-10-CM

## 2020-12-21 DIAGNOSIS — I5181 Takotsubo syndrome: Secondary | ICD-10-CM

## 2020-12-21 LAB — BASIC METABOLIC PANEL
BUN/Creatinine Ratio: 17 (ref 9–23)
BUN: 15 mg/dL (ref 6–24)
CO2: 24 mmol/L (ref 20–29)
Calcium: 8.7 mg/dL (ref 8.7–10.2)
Chloride: 103 mmol/L (ref 96–106)
Creatinine, Ser: 0.89 mg/dL (ref 0.57–1.00)
Glucose: 96 mg/dL (ref 65–99)
Potassium: 4.3 mmol/L (ref 3.5–5.2)
Sodium: 140 mmol/L (ref 134–144)
eGFR: 80 mL/min/{1.73_m2} (ref >59)

## 2020-12-21 NOTE — Telephone Encounter (Signed)
Opened in error

## 2020-12-21 NOTE — Telephone Encounter (Deleted)
Patient did not show for her Heart Failure Clinic appointment on 12/21/20. Will attempt to reschedule.

## 2020-12-21 NOTE — Progress Notes (Deleted)
   Patient ID: Isabella Burch, female    DOB: 1970-05-10, 50 y.o.   MRN: [id number]  HPI  Isabella Burch is a 50 y/o female with a history of  Echo report from 12/10/20 reviewed and showed an EF of 20-25% thought to be stress induced.   LHC done 12/10/20 showed: Mid LAD lesion is 20% stenosed.  1.  Mild nonobstructive coronary artery disease.  Mild mid LAD myocardial bridge. 2.  Left ventricular angiography was not performed.  EF was severely reduced by echo. 3.  Severely elevated left ventricular end-diastolic pressure at 32 mmHg.  Admitted   Review of Systems    Physical Exam     Assessment & Plan:  1: Chronic heart failure with reduced ejection fraction thought to be stress induced- - NYHA class  2: HTN- - BP - BMP 12/20/20 reviewed and showed sodium 140, potassium 4.3, creatinine 0.89 and GFR 80  3: NSTEMI-

## 2020-12-21 NOTE — Telephone Encounter (Signed)
-----   Message from Creig Hines, NP sent at 12/21/2020  7:25 AM EDT ----- Kidney function and electrolytes are normal.  We started lisinopril.  I recommend a f/u bmet in 1 wk

## 2020-12-21 NOTE — Telephone Encounter (Signed)
Left voicemail message to call back for review of results and recommendations of some repeat labs.

## 2020-12-26 NOTE — Telephone Encounter (Signed)
Attempted to call the patient. No answer- I left a detailed message of results and provider recommendations for a repeat BMP to be done at the end of this week or early next week.  I asked that patient to please call back and confirm receipt of this message and scheduled her lab appointment.   Nursing does not need to speak with the patient if she calls back unless she has questions.   BMP order placed.

## 2020-12-28 ENCOUNTER — Other Ambulatory Visit: Payer: Self-pay

## 2020-12-28 ENCOUNTER — Other Ambulatory Visit (INDEPENDENT_AMBULATORY_CARE_PROVIDER_SITE_OTHER): Payer: Managed Care, Other (non HMO)

## 2020-12-28 DIAGNOSIS — I5181 Takotsubo syndrome: Secondary | ICD-10-CM

## 2020-12-28 DIAGNOSIS — I214 Non-ST elevation (NSTEMI) myocardial infarction: Secondary | ICD-10-CM

## 2020-12-28 DIAGNOSIS — I10 Essential (primary) hypertension: Secondary | ICD-10-CM

## 2020-12-28 DIAGNOSIS — Z79899 Other long term (current) drug therapy: Secondary | ICD-10-CM

## 2020-12-29 LAB — BASIC METABOLIC PANEL
BUN/Creatinine Ratio: 12 (ref 9–23)
BUN: 11 mg/dL (ref 6–24)
CO2: 26 mmol/L (ref 20–29)
Calcium: 9 mg/dL (ref 8.7–10.2)
Chloride: 103 mmol/L (ref 96–106)
Creatinine, Ser: 0.89 mg/dL (ref 0.57–1.00)
Glucose: 92 mg/dL (ref 65–99)
Potassium: 4.5 mmol/L (ref 3.5–5.2)
Sodium: 142 mmol/L (ref 134–144)
eGFR: 80 mL/min/{1.73_m2} (ref >59)

## 2021-01-04 ENCOUNTER — Telehealth: Payer: Self-pay | Admitting: Family

## 2021-01-04 NOTE — Telephone Encounter (Signed)
Spoke to patient after she returned our call in attempt to reschedule her no show [facility] Clinic appointnemt. Patient stated she had a previous appointment with cardiology who told her she did not need to attend our appointment and stated they would call us to reschedule but didn't.   Ozie Dimaria, NT

## 2021-01-16 ENCOUNTER — Encounter: Payer: Self-pay | Admitting: *Deleted

## 2021-01-16 ENCOUNTER — Telehealth: Payer: Self-pay | Admitting: *Deleted

## 2021-01-16 NOTE — Telephone Encounter (Addendum)
Spoke with patient and reviewed results and recommendations. She reports that she has upcoming appointment on Monday and confirmed upcoming date and time. Advised I did mail letter as well with this information. She verbalized understanding of results with no further questions.

## 2021-01-16 NOTE — Telephone Encounter (Signed)
-----   Message from Creig Hines, NP sent at 12/31/2020 10:24 AM EDT ----- Kidney function and electrolytes are normal

## 2021-01-16 NOTE — Telephone Encounter (Signed)
Spoke with patient and reviewed results and recommendations. Confirmed upcoming appointment with no further questions at this time.

## 2021-01-21 ENCOUNTER — Ambulatory Visit: Payer: Managed Care, Other (non HMO) | Admitting: Nurse Practitioner

## 2021-01-21 ENCOUNTER — Encounter: Payer: Self-pay | Admitting: Nurse Practitioner

## 2021-01-21 ENCOUNTER — Other Ambulatory Visit: Payer: Self-pay

## 2021-01-21 VITALS — BP 120/80 | HR 67 | Ht 65.0 in | Wt 134.2 lb

## 2021-01-21 DIAGNOSIS — Z720 Tobacco use: Secondary | ICD-10-CM

## 2021-01-21 DIAGNOSIS — E782 Mixed hyperlipidemia: Secondary | ICD-10-CM

## 2021-01-21 DIAGNOSIS — I5022 Chronic systolic (congestive) heart failure: Secondary | ICD-10-CM | POA: Diagnosis not present

## 2021-01-21 DIAGNOSIS — I5181 Takotsubo syndrome: Secondary | ICD-10-CM

## 2021-01-21 DIAGNOSIS — I2510 Atherosclerotic heart disease of native coronary artery without angina pectoris: Secondary | ICD-10-CM

## 2021-01-21 DIAGNOSIS — I10 Essential (primary) hypertension: Secondary | ICD-10-CM

## 2021-01-21 DIAGNOSIS — I77810 Thoracic aortic ectasia: Secondary | ICD-10-CM

## 2021-01-21 DIAGNOSIS — I428 Other cardiomyopathies: Secondary | ICD-10-CM | POA: Diagnosis not present

## 2021-01-21 DIAGNOSIS — I77819 Aortic ectasia, unspecified site: Secondary | ICD-10-CM

## 2021-01-21 NOTE — Patient Instructions (Signed)
Medication Instructions:  No changes at this time.   *If you need a refill on your cardiac medications before your next appointment, please call your pharmacy*   Lab Work: Lipid and liver panel. These are fasting labs and you will need not to eat or drink anything after midnight except sip of water.  If not able to get done at upcoming appointment with Dr. Gavin Potters please give [place] a call so we can set up appointment.     If you have labs (blood work) drawn today and your tests are completely normal, you will receive your results only by: MyChart Message (if you have MyChart) OR A paper copy in the mail If you have any lab test that is abnormal or we need to change your treatment, we will call you to review the results.   Testing/Procedures: Your physician has requested that you have a limited echocardiogram. Echocardiography is a painless test that uses sound waves to create images of your heart. It provides your doctor with information about the size and shape of your heart and how well your heart's chambers and valves are working. This procedure takes approximately one hour. There are no restrictions for this procedure.    Follow-Up: At [facility], you and your health needs are our priority.  As part of our continuing mission to provide you with exceptional heart care, we have created designated Provider Care Teams.  These Care Teams include your primary Cardiologist (physician) and Advanced Practice Providers (APPs -  Physician Assistants and Nurse Practitioners) who all work together to provide you with the care you need, when you need it.   Your next appointment:   3 month(s)  The format for your next appointment:   In Person  Provider:   Lorine Bears, MD or Nicolasa Ducking, NP

## 2021-01-21 NOTE — Progress Notes (Signed)
Office Visit    Patient Name: Isabella Burch Date of Encounter: 01/21/2021  Primary Care Provider:  Berniece Pap, FNP Primary Cardiologist:  Lorine Bears, MD  Chief Complaint    50 year old female with a history of hypertension, anxiety, rheumatoid arthritis, and tobacco abuse, who presents for follow-up after recent admission for non-STEMI and finding of Takotsubo cardiomyopathy in August 2022.  Past Medical History    Past Medical History:  Diagnosis Date   Anxiety    Ascending aorta dilatation (HCC)    a. 11/2020 CTA chest: 3.8cm asc Ao.   Extensor tendon rupture, non-traumatic, hand and wrist    right   Heart murmur    History of kidney stones    Hypertension    Nonobstructive CAD (coronary artery disease)    a. 11/2020 NSTEMI/Cath: LM nl, LAD 64m, D1/2 nl, LCX min irregs, RCA nl, RPDA/RPAV nl.   Rheumatoid arthritis (HCC) dx 2006   BILATERAL HANDS AND RIGHT ANKLE   Takotsubo cardiomyopathy    a. 11/2020 Echo: EF 20-25%, mid-apical ant, inf, and apical AK. Nl RV size/fxn. Mild MR; b. 11/2020 Cath: nonobs dzs.   Tobacco abuse    Past Surgical History:  Procedure Laterality Date   CARDIAC VALVE REPLACEMENT     CYSTOSCOPY WITH URETEROSCOPY, STONE BASKETRY AND STENT PLACEMENT  2011   LEFT HEART CATH AND CORONARY ANGIOGRAPHY N/A 12/10/2020   Procedure: LEFT HEART CATH AND CORONARY ANGIOGRAPHY;  Surgeon: Iran Ouch, MD;  Location: [facility] INVASIVE CV LAB;  Service: Cardiovascular;  Laterality: N/A;   LEFT WRIST EXTENSOR TENDON REPAIR     TENDON TRANSFER Right 11/09/2014   Procedure: RIGHT THUMB EIP TO EPL TENDON TRANSFER;  Surgeon: Bradly Bienenstock, MD;  Location: [facility] [place];  Service: Orthopedics;  Laterality: Right;   TUBAL LIGATION      Allergies  Allergies  Allergen Reactions   Enbrel [Etanercept] Swelling   Humira [Adalimumab] Other (See Comments)    kidney dysfunction    History of Present Illness    50 year old female with the above  past medical history including hypertension, anxiety, rheumatoid arthritis, and tobacco abuse.  On August 22, while at church, she had sudden onset of substernal chest heaviness with radiation to the jaw, associated with diaphoresis, nausea, and dyspnea.  She was taken via EMS to the [facility] ED where ECG showed sinus rhythm with left atrial enlargement, prior anteroseptal infarct, and 1 mm ST segment elevation in V1 and V2 with more subtle ST elevation in V3 through V6.  CT angio of the chest was negative for PE, though she was incidentally noted to have mild dilation of the ascending aorta at 3.8 cm.  Troponin rose to 3284.  Echocardiogram showed an EF of 20 to 25% with mid-apical anterior, inferior, and apical akinesis consistent with a stress-induced cardiomyopathy.  Diagnostic catheterization showed minimal, nonobstructive CAD, and medical therapy was recommended.  She did require diuresis.  Medical therapy was limited by soft blood pressures.  Ms. Chason was last seen in cardiology clinic on September 1, at which time she was doing well.  She had quit smoking.  I was able to add low-dose lisinopril-2.5 mg daily and labs were stable at follow-up.  Since her last visit, she has done well.  She occasionally notes an isolated palpitation/skipped beat but no persistent tachycardias.  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  She is able to do this without  any chest pain or dyspnea.  She denies PND, orthopnea, dizziness, syncope, edema, or early satiety.  Home Medications    Current Outpatient Medications  Medication Sig Dispense Refill   acetaminophen (TYLENOL) 500 MG tablet Take 500 mg by mouth every 6 (six) hours as needed for moderate pain, mild pain, headache or fever.     aspirin EC 81 MG EC tablet Take 1 tablet (81 mg total) by mouth daily. Swallow whole. 30 tablet 11   atorvastatin (LIPITOR) 20 MG tablet Take 1 tablet (20 mg total) by mouth daily. 30 tablet  3   DULoxetine (CYMBALTA) 30 MG capsule Take 1 capsule (30 mg total) by mouth daily. 30 capsule 2   fluticasone (FLONASE) 50 MCG/ACT nasal spray SPRAY 2 SPRAYS INTO EACH NOSTRIL EVERY DAY 16 mL 1   furosemide (LASIX) 20 MG tablet Take 1 tablet (20 mg total) by mouth daily. 30 tablet 3   leflunomide (ARAVA) 10 MG tablet TAKE 1 TABLET BY MOUTH ONCE A DAY     lisinopril (ZESTRIL) 2.5 MG tablet Take 1 tablet (2.5 mg total) by mouth daily. 30 tablet 6   meclizine (ANTIVERT) 25 MG tablet Take 1 tablet (25 mg total) by mouth 3 (three) times daily as needed for dizziness. 30 tablet 0   metoprolol succinate (TOPROL-XL) 25 MG 24 hr tablet Take 0.5 tablets (12.5 mg total) by mouth daily. 30 tablet 3   ondansetron (ZOFRAN ODT) 4 MG disintegrating tablet Take 1 tablet (4 mg total) by mouth every 8 (eight) hours as needed for nausea or vomiting. 20 tablet 0   ORENCIA CLICKJECT 125 MG/ML SOAJ Inject 1 Syringe into the skin once a week. (Patient not taking: Reported on 01/21/2021)     VITAMIN D PO Take by mouth. 4000IU     No current facility-administered medications for this visit.     Review of Systems    Occasional palpitations but otherwise doing well.  She denies chest pain, dyspnea, PND, orthopnea, dizziness, syncope, edema, or early satiety.  All other systems reviewed and are otherwise negative except as noted above.  Physical Exam    VS:  BP 120/80   Pulse 67   Ht 5\' 5"  (1.651 m)   Wt 134 lb 3.2 oz (60.9 kg)   SpO2 97%   BMI 22.33 kg/m  , BMI Body mass index is 22.33 kg/m.     GEN: Well nourished, well developed, in no acute distress. HEENT: normal. Neck: Supple, no JVD, carotid bruits, or masses. Cardiac: RRR, no murmurs, rubs, or gallops. No clubbing, cyanosis, edema.  Radials/PT 2+ and equal bilaterally.  Respiratory:  Respirations regular and unlabored, clear to auscultation bilaterally. GI: Soft, nontender, nondistended, BS + x 4. MS: no deformity or atrophy. Skin: warm and dry, no  rash. Neuro:  Strength and sensation are intact. Psych: Normal affect.  Accessory Clinical Findings    ECG personally reviewed by me today -regular sinus rhythm, 67, biatrial enlargement, inferior and anterolateral T wave inversion - no acute changes.  Lab Results  Component Value Date   WBC 5.5 12/11/2020   HGB 12.9 12/11/2020   HCT 38.9 12/11/2020   MCV 97.3 12/11/2020   PLT 257 12/11/2020   Lab Results  Component Value Date   CREATININE 0.89 12/28/2020   BUN 11 12/28/2020   NA 142 12/28/2020   K 4.5 12/28/2020   CL 103 12/28/2020   CO2 26 12/28/2020   Lab Results  Component Value Date   ALT 13 12/10/2020  AST 36 12/10/2020   ALKPHOS 72 12/10/2020   BILITOT 1.0 12/10/2020   Lab Results  Component Value Date   CHOL 181 12/10/2020   HDL 50 12/10/2020   LDLCALC 116 (H) 12/10/2020   TRIG 73 12/10/2020   CHOLHDL 3.6 12/10/2020    Lab Results  Component Value Date   HGBA1C 5.7 (H) 12/10/2020    Assessment & Plan    1.  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  Cath showed nonobstructive LAD and minimal circumflex disease, and she has been medically managed for stress-induced cardiomyopathy.  She is currently on low doses of beta-blocker, ACE inhibitor, and furosemide.  She is euvolemic on examination and doing well with her activities at home, without symptoms or limitations.  As we are roughly 40 days since her event, I will arrange for a follow-up limited echo to reevaluate LV function.  Pending results, we can potentially discontinue furosemide therapy.  2.  Nonobstructive CAD: Status post non-STEMI in the setting of above in late August.  Catheterization with minimal LAD disease and only minor irregularities in the left circumflex.  She has been active without chest pain or dyspnea.  She remains on aspirin, statin, and beta-blocker  therapy.  3.  Essential hypertension: Blood pressure stable at 120/80 today.  Continue current regimen.  4.  Hyperlipidemia: LDL of 116 on August 22.  She is due for follow-up lipids and LFTs.  She notes that she is to have a battery of labs with rheumatology tomorrow.  She will see if lipids can be checked but if not, we will arrange for follow-up here.  5.  Tobacco abuse: She has not smoked since discharge.  I congratulated her on this and encouraged her to remain off of cigarettes.  6.  Rheumatoid arthritis: Followed closely by rheumatology with plans for follow-up tomorrow.  7.  Dilated ascending aorta: CTA of the chest initially read as 4 cm ascending aorta with addendum showing 3.8 cm.  Plan follow-up in 1 year.  Blood pressure is well controlled.  8.  Depression and anxiety: Patient had significant amount of stress at home prior to hospitalization.  She is on Cymbalta and is followed by primary care.  9.  Disposition: Plan to follow-up lipids and LFTs.  Follow-up limited echo.  Follow-up in clinic in 3 months or sooner if necessary.   Nicolasa Ducking, NP 01/21/2021, 4:50 PM

## 2021-02-21 ENCOUNTER — Telehealth: Payer: Self-pay | Admitting: Nurse Practitioner

## 2021-02-21 DIAGNOSIS — E782 Mixed hyperlipidemia: Secondary | ICD-10-CM

## 2021-02-21 DIAGNOSIS — I2510 Atherosclerotic heart disease of native coronary artery without angina pectoris: Secondary | ICD-10-CM

## 2021-02-21 DIAGNOSIS — I5022 Chronic systolic (congestive) heart failure: Secondary | ICD-10-CM

## 2021-02-21 NOTE — Telephone Encounter (Signed)
Last office visit we ordered lipid and liver panel to be done if not collected at visit with different provider. No labs were done so entered order to have them done tomorrow when she comes in for her echocardiogram. Advised these are fasting labs and she verbalized understanding. She was appreciative for the call with no further questions at this time.

## 2021-02-22 ENCOUNTER — Other Ambulatory Visit: Payer: Self-pay

## 2021-02-22 ENCOUNTER — Other Ambulatory Visit (INDEPENDENT_AMBULATORY_CARE_PROVIDER_SITE_OTHER): Payer: Managed Care, Other (non HMO)

## 2021-02-22 ENCOUNTER — Ambulatory Visit (INDEPENDENT_AMBULATORY_CARE_PROVIDER_SITE_OTHER): Payer: Managed Care, Other (non HMO)

## 2021-02-22 DIAGNOSIS — I5022 Chronic systolic (congestive) heart failure: Secondary | ICD-10-CM | POA: Diagnosis not present

## 2021-02-22 DIAGNOSIS — I5181 Takotsubo syndrome: Secondary | ICD-10-CM

## 2021-02-22 DIAGNOSIS — E782 Mixed hyperlipidemia: Secondary | ICD-10-CM

## 2021-02-22 DIAGNOSIS — Z720 Tobacco use: Secondary | ICD-10-CM | POA: Diagnosis not present

## 2021-02-22 DIAGNOSIS — I2510 Atherosclerotic heart disease of native coronary artery without angina pectoris: Secondary | ICD-10-CM

## 2021-02-22 LAB — ECHOCARDIOGRAM LIMITED
AR max vel: 2.1 cm2
AV Area VTI: 2.17 cm2
AV Area mean vel: 2.09 cm2
AV Mean grad: 4 mmHg
AV Peak grad: 7.4 mmHg
Ao pk vel: 1.36 m/s
Area-P 1/2: 4.74 cm2
Calc EF: 64.1 %
S' Lateral: 2.6 cm
Single Plane A2C EF: 61.4 %
Single Plane A4C EF: 67.7 %

## 2021-02-23 LAB — HEPATIC FUNCTION PANEL
ALT: 44 IU/L — ABNORMAL HIGH (ref 0–32)
AST: 36 IU/L (ref 0–40)
Albumin: 4 g/dL (ref 3.8–4.8)
Alkaline Phosphatase: 89 IU/L (ref 44–121)
Bilirubin Total: 0.4 mg/dL (ref 0.0–1.2)
Bilirubin, Direct: 0.13 mg/dL (ref 0.00–0.40)
Total Protein: 5.8 g/dL — ABNORMAL LOW (ref 6.0–8.5)

## 2021-02-23 LAB — LIPID PANEL
Chol/HDL Ratio: 2.9 ratio (ref 0.0–4.4)
Cholesterol, Total: 140 mg/dL (ref 100–199)
HDL: 48 mg/dL (ref >39)
LDL Chol Calc (NIH): 77 mg/dL (ref 0–99)
Triglycerides: 75 mg/dL (ref 0–149)
VLDL Cholesterol Cal: 15 mg/dL (ref 5–40)

## 2021-02-25 ENCOUNTER — Telehealth: Payer: Self-pay | Admitting: *Deleted

## 2021-02-25 DIAGNOSIS — I5022 Chronic systolic (congestive) heart failure: Secondary | ICD-10-CM

## 2021-02-25 DIAGNOSIS — I2510 Atherosclerotic heart disease of native coronary artery without angina pectoris: Secondary | ICD-10-CM

## 2021-02-25 NOTE — Telephone Encounter (Signed)
Left voicemail message to call back for review of results.  

## 2021-02-25 NOTE — Telephone Encounter (Signed)
-----   Message from Creig Hines, NP sent at 02/25/2021  8:40 AM EST ----- LDL of 77 w/o obstructive disease on cath.  ALT is mildly elevated.  Cont current dose of atorvastatin.  Rec f/u LFTs in a month to reassess.

## 2021-02-25 NOTE — Telephone Encounter (Signed)
-----   Message from Creig Hines, NP sent at 02/25/2021  8:38 AM EST ----- Heart squeeze has normalized and is now 55-60%.  This is great news!  The mitral valve is mildly leaky.  The Aortic size is on the high end of normal.  We will watch both with a f/u echo in a few years.  Overall very reassuring report.  I recommend that she reduce lasix to 20mg  daily as needed for wt gain of 2 lbs in 24 hrs or 5 lbs over the course of a week.

## 2021-02-26 MED ORDER — FUROSEMIDE 20 MG PO TABS: 20.0000 mg | ORAL_TABLET | ORAL | 3 refills | Status: DC | PRN

## 2021-02-26 NOTE — Telephone Encounter (Signed)
Reviewed results and recommendations with patient. We discussed medication change of her furosemide to as needed for weight gain and request for repeat labs. She requested I send her My Chart with this information. Advised that I would have someone call her to schedule those repeat labs. She verbalized understanding of instructions with no further questions at this time.

## 2021-03-27 ENCOUNTER — Other Ambulatory Visit: Payer: Self-pay

## 2021-03-27 ENCOUNTER — Other Ambulatory Visit (INDEPENDENT_AMBULATORY_CARE_PROVIDER_SITE_OTHER): Payer: Managed Care, Other (non HMO)

## 2021-03-27 DIAGNOSIS — I5022 Chronic systolic (congestive) heart failure: Secondary | ICD-10-CM | POA: Diagnosis not present

## 2021-03-27 DIAGNOSIS — I2510 Atherosclerotic heart disease of native coronary artery without angina pectoris: Secondary | ICD-10-CM

## 2021-03-28 ENCOUNTER — Telehealth: Payer: Self-pay | Admitting: *Deleted

## 2021-03-28 DIAGNOSIS — I2510 Atherosclerotic heart disease of native coronary artery without angina pectoris: Secondary | ICD-10-CM

## 2021-03-28 DIAGNOSIS — E782 Mixed hyperlipidemia: Secondary | ICD-10-CM

## 2021-03-28 DIAGNOSIS — I5022 Chronic systolic (congestive) heart failure: Secondary | ICD-10-CM

## 2021-03-28 LAB — HEPATIC FUNCTION PANEL
ALT: 16 IU/L (ref 0–32)
AST: 20 IU/L (ref 0–40)
Albumin: 4 g/dL (ref 3.8–4.8)
Alkaline Phosphatase: 88 IU/L (ref 44–121)
Bilirubin Total: 0.4 mg/dL (ref 0.0–1.2)
Bilirubin, Direct: 0.11 mg/dL (ref 0.00–0.40)
Total Protein: 6.1 g/dL (ref 6.0–8.5)

## 2021-03-28 MED ORDER — ATORVASTATIN CALCIUM 40 MG PO TABS: 40.0000 mg | ORAL_TABLET | Freq: Every day | ORAL | 3 refills | Status: DC

## 2021-03-28 NOTE — Telephone Encounter (Signed)
-----   Message from Creig Hines, NP sent at 03/28/2021 10:09 AM EST ----- Liver enzymes are normal.  With LDL previously noted to be above goal (was 77), and stable liver enzymes, I recommend that we increase atorvastatin to 40 mg daily.  She should have follow-up fasting lipids and LFTs in about 8 weeks.

## 2021-03-28 NOTE — Telephone Encounter (Signed)
Reviewed results and recommendations with patient. Scheduled her repeat labs and we discussed the medication increase. She verbalized understanding of our conversation, agreement with plan, and had no further questions at this time.

## 2021-04-16 ENCOUNTER — Other Ambulatory Visit: Payer: Self-pay | Admitting: Family

## 2021-04-16 ENCOUNTER — Telehealth: Payer: Self-pay | Admitting: Adult Health

## 2021-04-16 ENCOUNTER — Other Ambulatory Visit: Payer: Self-pay | Admitting: Adult Health

## 2021-04-16 MED ORDER — DULOXETINE HCL 30 MG PO CPEP: 30.0000 mg | ORAL_CAPSULE | Freq: Every day | ORAL | 0 refills | Status: DC

## 2021-04-16 NOTE — Telephone Encounter (Signed)
Pt.notified

## 2021-04-16 NOTE — Progress Notes (Signed)
No orders of the defined types were placed in this encounter.  Meds ordered this encounter  Medications   DULoxetine (CYMBALTA) 30 MG capsule    Sig: Take 1 capsule (30 mg total) by mouth daily.    Dispense:  90 capsule    Refill:  0  .

## 2021-04-16 NOTE — Telephone Encounter (Signed)
Meds ordered this encounter  Medications   DULoxetine (CYMBALTA) 30 MG capsule    Sig: Take 1 capsule (30 mg total) by mouth daily.    Dispense:  90 capsule    Refill:  0  Sent will see her January 11 th.  Follow treatment plan from office  if not improving or any worsening within 72 hours and also return to office or open medical facility at ANYTIME if any symptoms persist, change, or worsen or you have any further concerns or questions. Call 911 immediately for emergencies.

## 2021-04-16 NOTE — Telephone Encounter (Signed)
Pt is needing new prescription sent in for refill for DULoxetine (CYMBALTA) 30 MG capsule Pt states she has spoke with pharmacy and was advised to call PCP to get refill sent in  Pt uses [facility] on [address].

## 2021-05-01 ENCOUNTER — Other Ambulatory Visit: Payer: Self-pay

## 2021-05-01 ENCOUNTER — Encounter: Payer: Self-pay | Admitting: Adult Health

## 2021-05-01 ENCOUNTER — Ambulatory Visit (INDEPENDENT_AMBULATORY_CARE_PROVIDER_SITE_OTHER): Payer: Managed Care, Other (non HMO) | Admitting: Adult Health

## 2021-05-01 VITALS — BP 128/84 | HR 86 | Ht 65.0 in | Wt 141.0 lb

## 2021-05-01 DIAGNOSIS — I10 Essential (primary) hypertension: Secondary | ICD-10-CM | POA: Diagnosis not present

## 2021-05-01 DIAGNOSIS — I5181 Takotsubo syndrome: Secondary | ICD-10-CM | POA: Diagnosis not present

## 2021-05-01 DIAGNOSIS — F32A Depression, unspecified: Secondary | ICD-10-CM | POA: Diagnosis not present

## 2021-05-01 DIAGNOSIS — E782 Mixed hyperlipidemia: Secondary | ICD-10-CM

## 2021-05-01 LAB — HEPATIC FUNCTION PANEL
ALT: 26 U/L (ref 0–35)
AST: 25 U/L (ref 0–37)
Albumin: 4.1 g/dL (ref 3.5–5.2)
Alkaline Phosphatase: 85 U/L (ref 39–117)
Bilirubin, Direct: 0.2 mg/dL (ref 0.0–0.3)
Total Bilirubin: 0.7 mg/dL (ref 0.2–1.2)
Total Protein: 6.5 g/dL (ref 6.0–8.3)

## 2021-05-01 LAB — TSH: TSH: 1.48 u[IU]/mL (ref 0.35–5.50)

## 2021-05-01 MED ORDER — LISINOPRIL 2.5 MG PO TABS: 2.5000 mg | ORAL_TABLET | Freq: Every day | ORAL | 3 refills | Status: DC

## 2021-05-01 MED ORDER — FLUTICASONE PROPIONATE 50 MCG/ACT NA SUSP: NASAL | 1 refills | Status: DC

## 2021-05-01 MED ORDER — DULOXETINE HCL 60 MG PO CPEP: 60.0000 mg | ORAL_CAPSULE | Freq: Every day | ORAL | 0 refills | Status: DC

## 2021-05-01 NOTE — Progress Notes (Signed)
Established Patient Office Visit  Subjective:  Patient ID: Isabella Burch, female    DOB: [birth year]  Age: 51 y.o. MRN: [id number]  CC:  Chief Complaint  Patient presents with   Transitions Of Care    HPI [name] presents for here for transfer of care.   She is taking Cymbalta regularly 30 mg once daily she feels she is still having depression symptoms due to increase stress, separation with husband after marriage for 30 years. She would like to try an increase in Cymbalta and also counseling at psychiatry.   Saw [name] on this morning, labs ordered and reviewed what was ordered. Sees for Rheumatoid arthritis.   Reviewed note by cardiology as below.  She has seen [name] NP for stress induced cardiomyopathy. history of hypertension, anxiety, rheumatoid arthritis, and tobacco abuse, who presents for follow-up after recent admission for non-STEMI and finding of Takotsubo cardiomyopathy in August 2022.She was taken via EMS to [facility] ED where ECG showed sinus rhythm with left atrial enlargement, prior anteroseptal infarct, and 1 mm ST segment elevation in V1 and V2 with more subtle ST elevation in V3 through V6.  CT angio of the chest was negative for PE, though she was incidentally noted to have mild dilation of the ascending aorta at 3.8 cm.  Troponin rose to 3284.  Echocardiogram showed an EF of 20 to 25% with mid-apical anterior, inferior, and apical akinesis consistent with a stress-induced cardiomyopathy.  Diagnostic catheterization showed minimal, nonobstructive CAD, and medical therapy was recommended.  She did require diuresis.  Medical therapy was limited by soft blood pressures [name] was last seen in cardiology clinic on September 1, at which time she was doing well.  She had quit smoking.  I was able to add low-dose lisinopril-2.5 mg daily and labs were stable at follow-up.  Since her last visit, she has done well.  She occasionally notes an isolated  palpitation/skipped beat but no persistent tachycardias.  Her work at a [employer], corporate requires a fair amount of exertion and heavy lifting of boxes.  She is able to do this without any chest pain or dyspnea.  She denies PND, orthopnea, dizziness, syncope, edema, or early satiety.  Patient  denies any fever, body aches,chills, rash, chest pain, shortness of breath, nausea, vomiting, or diarrhea.  Denies dizziness, lightheadedness, pre syncopal or syncopal episodes.    Past Medical History:  Diagnosis Date   Anxiety    Ascending aorta dilatation ([place])    a. 11/2020 CTA chest: 3.8cm asc Ao.   Extensor tendon rupture, non-traumatic, hand and wrist    right   Heart murmur    History of kidney stones    Hypertension    Nonobstructive CAD (coronary artery disease)    a. 11/2020 NSTEMI/Cath: LM nl, LAD 62m D1/2 nl, LCX min irregs, RCA nl, RPDA/RPAV nl.   Rheumatoid arthritis [place]   BILATERAL HANDS AND RIGHT ANKLE   Takotsubo cardiomyopathy    a. 11/2020 Echo: EF 20-25%, mid-apical ant, inf, and apical AK. Nl RV size/fxn. Mild MR; b. 11/2020 Cath: nonobs dzs.   Tobacco abuse     Past Surgical History:  Procedure Laterality Date   CARDIAC VALVE REPLACEMENT     CYSTOSCOPY WITH URETEROSCOPY, STONE BASKETRY AND STENT PLACEMENT  2011   LEFT HEART CATH AND CORONARY ANGIOGRAPHY N/A 12/10/2020   Procedure: LEFT HEART CATH AND CORONARY ANGIOGRAPHY;  Surgeon: AWellington Hampshire MD;  [facility];  Service: Cardiovascular;  Laterality: N/A;   LEFT WRIST EXTENSOR TENDON REPAIR     TENDON TRANSFER Right 11/09/2014   Procedure: RIGHT THUMB EIP TO EPL TENDON TRANSFER;  Surgeon: Iran Planas, MD;  Location: [facility];  Service: Orthopedics;  Laterality: Right;   TUBAL LIGATION      Family History  Problem Relation Age of Onset   Hypothyroidism Mother    Coronary artery disease Mother    Hyperlipidemia Father    Hypertension Father    Diabetes Father     Social  History   Socioeconomic History   Marital status: Married    Spouse name: Normagene Harvie   Number of children: 2   Years of education: 12   Highest education level: Not on file  Occupational History   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 She lives at home with her husband, two children and a granddaughter. They have 2 dogs (great pyreneees and dachbull).  She works at the [employer] in [employer]. She enjoys working and being around children.    Social Determinants of Health   Financial Resource Strain: Not on file  Food Insecurity: Not on file  Transportation Needs: Not on file  Physical Activity: Not on file  Stress: Not on file  Social Connections: Not on file  Intimate Partner Violence: Not on file    Outpatient Medications Prior to Visit  Medication Sig Dispense Refill   acetaminophen (TYLENOL) 500 MG tablet Take 500 mg by mouth every 6 (six) hours as needed for moderate pain, mild pain, headache or fever.     aspirin EC 81 MG EC tablet Take 1 tablet (81 mg total) by mouth daily. Swallow whole. 30 tablet 11   atorvastatin (LIPITOR) 40 MG tablet Take 1 tablet (40 mg total) by mouth daily. 90 tablet 3   leflunomide (ARAVA) 10 MG tablet TAKE 1 TABLET BY MOUTH ONCE A DAY     meclizine (ANTIVERT) 25 MG tablet Take 1 tablet (25 mg total) by mouth 3 (three) times daily  as needed for dizziness. 30 tablet 0   metoprolol succinate (TOPROL-XL) 25 MG 24 hr tablet Take 0.5 tablets (12.5 mg total) by mouth daily. 30 tablet 3   ondansetron (ZOFRAN ODT) 4 MG disintegrating tablet Take 1 tablet (4 mg total) by mouth every 8 (eight) hours as needed for nausea or vomiting. 20 tablet 0   ORENCIA CLICKJECT 106 MG/ML SOAJ Inject 1 Syringe into the skin once a week.     VITAMIN D PO Take by mouth. 4000IU     DULoxetine (CYMBALTA) 30 MG capsule Take 1 capsule (30 mg total) by mouth daily. 90 capsule 0   fluticasone (FLONASE) 50 MCG/ACT nasal spray SPRAY 2 SPRAYS INTO EACH NOSTRIL EVERY  DAY 16 mL 1   furosemide (LASIX) 20 MG tablet Take 1 tablet (20 mg total) by mouth as needed (As needed for weight gain of 2 pounds in 24 hours or 5 pounds in one week.). 30 tablet 3   lisinopril (ZESTRIL) 2.5 MG tablet Take 1 tablet (2.5 mg total) by mouth daily. 30 tablet 6   No facility-administered medications prior to visit.    Allergies  Allergen Reactions   Enbrel [Etanercept] Swelling   Humira [Adalimumab] Other (See Comments)    kidney dysfunction    ROS Review of Systems  Constitutional:  Positive for fatigue. Negative for activity change, appetite change, chills, diaphoresis, fever and unexpected weight change.  HENT: Negative.    Respiratory: Negative.    Cardiovascular: Negative.   Gastrointestinal: Negative.   Genitourinary: Negative.   Musculoskeletal: Negative.   Skin: Negative.   Neurological: Negative.   Psychiatric/Behavioral:  Negative for agitation, behavioral problems, confusion, decreased concentration, dysphoric mood, hallucinations, self-injury, sleep disturbance and suicidal ideas. The patient is nervous/anxious. The patient is not hyperactive.      Objective:    Physical Exam  General: Appearance:    Well developed, well nourished female in no acute distress  Eyes:    PERRL, conjunctiva/corneas clear, EOM's intact       Lungs:     Clear to  auscultation bilaterally, respirations unlabored  Heart:    Normal heart rate. Normal rhythm. No murmurs, rubs, or gallops.    MS:   All extremities are intact.    Neurologic:   Awake, alert, oriented x 3. No apparent focal neurological           defect.     BP 128/84    Pulse 86    Ht _0  (1.651 m)    Wt 141 lb (64 kg)    SpO2 92%    BMI 23.46 kg/m  Wt Readings from Last 3 Encounters:  05/01/21 141 lb (64 kg)  01/21/21 134 lb 3.2 oz (60.9 kg)  12/20/20 130 lb 8 oz (59.2 kg)     Health Maintenance Due  Topic Date Due   COVID-19 Vaccine (1) Never done   Pneumococcal Vaccine 30-45 Years old (1 - PCV) Never done   HIV Screening  Never done   Hepatitis C Screening  Never done   TETANUS/TDAP  Never done   COLONOSCOPY (Pts 45-32yr Insurance coverage will need to be confirmed)  Never done   INFLUENZA VACCINE  Never done    There are no preventive care reminders to display for this patient.  Lab Results  Component Value Date   TSH 1.48 05/01/2021   Lab Results  Component Value Date   WBC 5.5 12/11/2020   HGB 12.9 12/11/2020   HCT 38.9 12/11/2020   MCV 97.3 12/11/2020   PLT 257 12/11/2020   Lab Results  Component Value Date   NA 142 12/28/2020   K 4.5 12/28/2020   CO2 26 12/28/2020   GLUCOSE 92 12/28/2020   BUN 11 12/28/2020   CREATININE 0.89 12/28/2020   BILITOT 0.7 05/01/2021   ALKPHOS 85 05/01/2021   AST 25 05/01/2021   ALT 26 05/01/2021   PROT 6.5 05/01/2021   ALBUMIN 4.1 05/01/2021   CALCIUM 9.0 12/28/2020   ANIONGAP 7 12/11/2020   EGFR 80 12/28/2020   GFR 85.37 11/21/2015   Lab Results  Component Value Date   CHOL 140 02/22/2021   Lab Results  Component Value Date   HDL 48 02/22/2021  Lab Results  Component Value Date   LDLCALC 77 02/22/2021   Lab Results  Component Value Date   TRIG 75 02/22/2021   Lab Results  Component Value Date   CHOLHDL 2.9 02/22/2021   Lab Results  Component Value Date   HGBA1C 5.7 (H) 12/10/2020       Assessment & Plan:   Problem List Items Addressed This Visit       Cardiovascular and Mediastinum   Hypertension   Relevant Medications   lisinopril (ZESTRIL) 2.5 MG tablet   Stress-induced cardiomyopathy - Primary   Relevant Medications   lisinopril (ZESTRIL) 2.5 MG tablet     Other   HLD (hyperlipidemia)   Relevant Medications   lisinopril (ZESTRIL) 2.5 MG tablet   Other Relevant Orders   Hepatic function panel (Completed)   Depression   Relevant Medications   DULoxetine (CYMBALTA) 60 MG capsule   Other Relevant Orders   TSH (Completed)   Ambulatory referral to Psychiatry    Meds ordered this encounter  Medications   DULoxetine (CYMBALTA) 60 MG capsule    Sig: Take 1 capsule (60 mg total) by mouth daily.    Dispense:  90 capsule    Refill:  0   fluticasone (FLONASE) 50 MCG/ACT nasal spray    Sig: SPRAY 2 SPRAYS INTO EACH NOSTRIL EVERY DAY    Dispense:  16 mL    Refill:  1   lisinopril (ZESTRIL) 2.5 MG tablet    Sig: Take 1 tablet (2.5 mg total) by mouth daily.    Dispense:  90 tablet    Refill:  3   Keep follow up with cardiology.  Medications Discontinued During This Encounter  Medication Reason   DULoxetine (CYMBALTA) 30 MG capsule    fluticasone (FLONASE) 50 MCG/ACT nasal spray Reorder   furosemide (LASIX) 20 MG tablet Completed Course   lisinopril (ZESTRIL) 2.5 MG tablet Reorder   Red Flags discussed. The patient was given clear instructions to go to ER or return to medical center if any red flags develop, symptoms do not improve, worsen or new problems develop. They verbalized understanding.   Discussed known black box warning for anti depression/ anxiety medication. Need to report any behavioral changes right, if any homicidal or suicidal thoughts or ideas seek medical attention right away. Call 911.    Follow-up: Return in about 6 weeks (around 06/12/2021), or if symptoms worsen or fail to improve, for at any time for any worsening symptoms, Go to  Emergency room/ urgent care if worse.    Marcille Buffy, FNP

## 2021-05-01 NOTE — Patient Instructions (Addendum)
[facility]   [phone] [address] [place], [place] [address] fax: [phone] [email or web address] Generalized Anxiety Disorder, Adult Generalized anxiety disorder (GAD) is a mental health condition. Unlike normal worries, anxiety related to GAD is not triggered by a specific event. These worries do not fade or get better with time. GAD interferes with relationships, work, and school. GAD symptoms can vary from mild to severe. People with severe GAD can have intense waves of anxiety with physical symptoms that are similar to panic attacks. What are the causes? The exact cause of GAD is not known, but the following are believed to have an impact: Differences in natural brain chemicals. Genes passed down from parents to children. Differences in the way threats are perceived. Development and stress during childhood. Personality. What increases the risk? The following factors may make you more likely to develop this condition: Being female. Having a family history of anxiety disorders. Being very shy. Experiencing very stressful life events, such as the death of a loved one. Having a very stressful family environment. What are the signs or symptoms? People with GAD often worry excessively about many things in their lives, such as their health and family. Symptoms may also include: Mental and emotional symptoms: Worrying excessively about natural disasters. Fear of being late. Difficulty concentrating. Fears that others are judging your performance. Physical symptoms: Fatigue. Headaches, muscle tension, muscle twitches, trembling, or feeling shaky. Feeling like your heart is pounding or beating very fast. Feeling out of breath or like you cannot take a deep breath. Having trouble falling asleep or staying asleep, or experiencing restlessness. Sweating. Nausea, diarrhea, or irritable bowel syndrome (IBS). Behavioral symptoms: Experiencing erratic moods or  irritability. Avoidance of new situations. Avoidance of people. Extreme difficulty making decisions. How is this diagnosed? This condition is diagnosed based on your symptoms and medical history. You will also have a physical exam. Your health care provider may perform tests to rule out other possible causes of your symptoms. To be diagnosed with GAD, a person must have anxiety that: Is out of his or her control. Affects several different aspects of his or her life, such as work and relationships. Causes distress that makes him or her unable to take part in normal activities. Includes at least three symptoms of GAD, such as restlessness, fatigue, trouble concentrating, irritability, muscle tension, or sleep problems. Before your health care provider can confirm a diagnosis of GAD, these symptoms must be present more days than they are not, and they must last for 6 months or longer. How is this treated? This condition may be treated with: Medicine. Antidepressant medicine is usually prescribed for long-term daily control. Anti-anxiety medicines may be added in severe cases, especially when panic attacks occur. Talk therapy (psychotherapy). Certain types of talk therapy can be helpful in treating GAD by providing support, education, and guidance. Options include: Cognitive behavioral therapy (CBT). People learn coping skills and self-calming techniques to ease their physical symptoms. They learn to identify unrealistic thoughts and behaviors and to replace them with more appropriate thoughts and behaviors. Acceptance and commitment therapy (ACT). This treatment teaches people how to be mindful as a way to cope with unwanted thoughts and feelings. Biofeedback. This process trains you to manage your body's response (physiological response) through breathing techniques and relaxation methods. You will work with a therapist while machines are used to monitor your physical symptoms. Stress management  techniques. These include yoga, meditation, and exercise. A mental health specialist can help determine which treatment is best for  you. Some people see improvement with one type of therapy. However, other people require a combination of therapies. Follow these instructions at home: Lifestyle Maintain a consistent routine and schedule. Anticipate stressful situations. Create a plan and allow extra time to work with your plan. Practice stress management or self-calming techniques that you have learned from your therapist or your health care provider. Exercise regularly and spend time outdoors. Eat a healthy diet that includes plenty of vegetables, fruits, whole grains, low-fat dairy products, and lean protein. Do not eat a lot of foods that are high in fat, added sugar, or salt (sodium). Drink plenty of water. Avoid alcohol. Alcohol can increase anxiety. Avoid caffeine and certain over-the-counter cold medicines. These may make you feel worse. Ask your pharmacist which medicines to avoid. General instructions Take over-the-counter and prescription medicines only as told by your health care provider. Understand that you are likely to have setbacks. Accept this and be kind to yourself as you persist to take better care of yourself. Anticipate stressful situations. Create a plan and allow extra time to work with your plan. Recognize and accept your accomplishments, even if you judge them as small. Spend time with people who care about you. Keep all follow-up visits. This is important. Where to find more information [employer]: http://www.maynard.net/ Substance Abuse and Mental Health Services: SkateOasis.com.pt Contact a health care provider if: Your symptoms do not get better. Your symptoms get worse. You have signs of depression, such as: A persistently sad or irritable mood. Loss of enjoyment in activities that used to bring you joy. Change in weight or eating. Changes in  sleeping habits. Get help right away if: You have thoughts about hurting yourself or others. If you ever feel like you may hurt yourself or others, or have thoughts about taking your own life, get help right away. Go to your nearest emergency department or: Call your local emergency services (911 in the [place] Call a suicide crisis helpline, such as the National Suicide Prevention Lifeline at 320-112-6698 or 988 in the [place] This is open 24 hours a day in the [place] Text the Crisis Text Line at 2607019811 (in the [place] Summary Generalized anxiety disorder (GAD) is a mental health condition that involves worry that is not triggered by a specific event. People with GAD often worry excessively about many things in their lives, such as their health and family. GAD may cause symptoms such as restlessness, trouble concentrating, sleep problems, frequent sweating, nausea, diarrhea, headaches, and trembling or muscle twitching. A mental health specialist can help determine which treatment is best for you. Some people see improvement with one type of therapy. However, other people require a combination of therapies. This information is not intended to replace advice given to you by your health care provider. Make sure you discuss any questions you have with your health care provider. Document Revised: 10/31/2020 Document Reviewed: 07/29/2020 Elsevier Patient Education  2022 Elsevier Inc.  Duloxetine Delayed-Release Capsules What is this medication? DULOXETINE (doo LOX e teen) treats depression, anxiety, fibromyalgia, and certain types of chronic pain such as nerve, bone, or joint pain. It increases the amount of serotonin and norepinephrine in the brain, hormones that help regulate mood and pain. It belongs to a group of medications called SNRIs. This medicine may be used for other purposes; ask your health care provider or pharmacist if you have questions. COMMON BRAND NAME(S): Cymbalta, Drizalma,  Irenka What should I tell my care team before I take this  medication? They need to know if you have any of these conditions: Bipolar disorder Glaucoma High blood pressure Kidney disease Liver disease Seizures Suicidal thoughts, plans or attempt; a previous suicide attempt by you or a family member Take medications that treat or prevent blood clots Taken medications called MAOIs like Carbex, Eldepryl, Marplan, Nardil, and Parnate within 14 days Trouble passing urine An unusual reaction to duloxetine, other medications, foods, dyes, or preservatives Pregnant or trying to get pregnant Breast-feeding How should I use this medication? Take this medication by mouth with a glass of water. Follow the directions on the prescription label. Do not crush, cut or chew some capsules of this medication. Some capsules may be opened and sprinkled on applesauce. Check with your care team or pharmacist if you are not sure. You can take this medication with or without food. Take your medication at regular intervals. Do not take your medication more often than directed. Do not stop taking this medication suddenly except upon the advice of your care team. Stopping this medication too quickly may cause serious side effects or your condition may worsen. A special MedGuide will be given to you by the pharmacist with each prescription and refill. Be sure to read this information carefully each time. Talk to your care team regarding the use of this medication in children. While this medication may be prescribed for children as young as 83 years of age for selected conditions, precautions do apply. Overdosage: If you think you have taken too much of this medicine contact a poison control center or emergency room at once. NOTE: This medicine is only for you. Do not share this medicine with others. What if I miss a dose? If you miss a dose, take it as soon as you can. If it is almost time for your next dose, take only that  dose. Do not take double or extra doses. What may interact with this medication? Do not take this medication with any of the following: Desvenlafaxine Levomilnacipran Linezolid MAOIs like Carbex, Eldepryl, Emsam, Marplan, Nardil, and Parnate Methylene blue (injected into a vein) Milnacipran Safinamide Thioridazine Venlafaxine Viloxazine This medication may also interact with the following: Alcohol Amphetamines Aspirin and aspirin-like medications Certain antibiotics like ciprofloxacin and enoxacin Certain medications for blood pressure, heart disease, irregular heart beat Certain medications for depression, anxiety, or psychotic disturbances Certain medications for migraine headache like almotriptan, eletriptan, frovatriptan, naratriptan, rizatriptan, sumatriptan, zolmitriptan Certain medications that treat or prevent blood clots like warfarin, enoxaparin, and dalteparin Cimetidine Fentanyl Lithium NSAIDS, medications for pain and inflammation, like ibuprofen or naproxen Phentermine Procarbazine Rasagiline Sibutramine St. John's wort Theophylline Tramadol Tryptophan This list may not describe all possible interactions. Give your health care provider a list of all the medicines, herbs, non-prescription drugs, or dietary supplements you use. Also tell them if you smoke, drink alcohol, or use illegal drugs. Some items may interact with your medicine. What should I watch for while using this medication? Tell your care team if your symptoms do not get better or if they get worse. Visit your care team for regular checks on your progress. Because it may take several weeks to see the full effects of this medication, it is important to continue your treatment as prescribed by your care team. This medication may cause serious skin reactions. They can happen weeks to months after starting the medication. Contact your care team right away if you notice fevers or flu-like symptoms with a  rash. The rash may be red or purple and  then turn into blisters or peeling of the skin. Or, you might notice a red rash with swelling of the face, lips, or lymph nodes in your neck or under your arms. Watch for new or worsening thoughts of suicide or depression. This includes sudden changes in mood, behaviors, or thoughts. These changes can happen at any time but are more common in the beginning of treatment or after a change in dose. Call your care team right away if you experience these thoughts or worsening depression. Manic episodes may happen in patients with bipolar disorder who take this medication. Watch for changes in feelings or behaviors such as feeling anxious, nervous, agitated, panicky, irritable, hostile, aggressive, impulsive, severely restless, overly excited and hyperactive, or trouble sleeping. These symptoms can happen at any time, but are more common in the beginning of treatment or after a change in dose. Call your care team right away if you notice any of these symptoms. You may get drowsy or dizzy. Do not drive, use machinery, or do anything that needs mental alertness until you know how this medication affects you. Do not stand or sit up quickly, especially if you are an older patient. This reduces the risk of dizzy or fainting spells. Alcohol may interfere with the effect of this medication. Avoid alcoholic drinks. This medication may increase blood sugar. The risk may be higher in patients who already have diabetes. Ask your care team what you can do to lower your risk of diabetes while taking this medication. This medication can cause an increase in blood pressure. This medication can also cause a sudden drop in your blood pressure, which may make you feel faint and increase the chance of a fall. These effects are most common when you first start the medication or when the dose is increased, or during use of other medications that can cause a sudden drop in blood pressure. Check with  your care team for instructions on monitoring your blood pressure while taking this medication. Your mouth may get dry. Chewing sugarless gum or sucking hard candy, and drinking plenty of water, may help. Contact your care team if the problem does not go away or is severe. What side effects may I notice from receiving this medication? Side effects that you should report to your care team as soon as possible: Allergic reactions--skin rash, itching, hives, swelling of the face, lips, tongue, or throat Bleeding--bloody or black, tar-like stools, red or dark brown urine, vomiting blood or brown material that looks like coffee grounds, small, red or purple spots on skin, unusual bleeding or bruising Increase in blood pressure Liver injury--right upper belly pain, loss of appetite, nausea, light-colored stool, dark yellow or brown urine, yellowing skin or eyes, unusual weakness or fatigue Low sodium level--muscle weakness, fatigue, dizziness, headache, confusion Redness, blistering, peeling, or loosening of the skin, including inside the mouth Serotonin syndrome--irritability, confusion, fast or irregular heartbeat, muscle stiffness, twitching muscles, sweating, high fever, seizures, chills, vomiting, diarrhea Sudden eye pain or change in vision such as blurry vision, seeing halos around lights, vision loss Thoughts of suicide or self-harm, worsening mood, feelings of depression Trouble passing urine Side effects that usually do not require medical attention (report to your care team if they continue or are bothersome): Change in sex drive or performance Constipation Diarrhea Dizziness Dry mouth Excessive sweating Loss of appetite Nausea Vomiting This list may not describe all possible side effects. Call your doctor for medical advice about side effects. You may report side effects to  FDA at 1-800-FDA-1088. Where should I keep my medication? Keep out of the reach of children and pets. Store at  room temperature between 15 and 30 degrees C (59 to 86 degrees F). Get rid of any unused medication after the expiration date. To get rid of medications that are no longer needed or have expired: Take the medication to a medication take-back program. Check with your pharmacy or law enforcement to find a location. If you cannot return the medication, check the label or package insert to see if the medication should be thrown out in the garbage or flushed down the toilet. If you are not sure, ask your care team. If it is safe to put it in the trash, take the medication out of the container. Mix the medication with cat litter, dirt, coffee grounds, or other unwanted substance. Seal the mixture in a bag or container. Put it in the trash. NOTE: This sheet is a summary. It may not cover all possible information. If you have questions about this medicine, talk to your doctor, pharmacist, or health care provider.  2022 Elsevier/Gold Standard (2020-04-17 00:00:00)

## 2021-05-01 NOTE — Progress Notes (Signed)
Hepatic function panel within normal limits. TSH within normal limits.

## 2021-05-02 ENCOUNTER — Encounter: Payer: Self-pay | Admitting: Adult Health

## 2021-05-07 ENCOUNTER — Ambulatory Visit: Payer: Managed Care, Other (non HMO) | Admitting: Nurse Practitioner

## 2021-05-24 ENCOUNTER — Other Ambulatory Visit (INDEPENDENT_AMBULATORY_CARE_PROVIDER_SITE_OTHER): Payer: Managed Care, Other (non HMO)

## 2021-05-24 ENCOUNTER — Other Ambulatory Visit: Payer: Self-pay | Admitting: Adult Health

## 2021-05-24 DIAGNOSIS — I5022 Chronic systolic (congestive) heart failure: Secondary | ICD-10-CM | POA: Diagnosis not present

## 2021-05-24 DIAGNOSIS — I2510 Atherosclerotic heart disease of native coronary artery without angina pectoris: Secondary | ICD-10-CM

## 2021-05-24 DIAGNOSIS — E782 Mixed hyperlipidemia: Secondary | ICD-10-CM

## 2021-05-25 LAB — HEPATIC FUNCTION PANEL
ALT: 15 IU/L (ref 0–32)
AST: 16 IU/L (ref 0–40)
Albumin: 4.1 g/dL (ref 3.8–4.8)
Alkaline Phosphatase: 108 IU/L (ref 44–121)
Bilirubin Total: 0.3 mg/dL (ref 0.0–1.2)
Bilirubin, Direct: 0.11 mg/dL (ref 0.00–0.40)
Total Protein: 6.3 g/dL (ref 6.0–8.5)

## 2021-05-25 LAB — LIPID PANEL
Chol/HDL Ratio: 3.1 ratio (ref 0.0–4.4)
Cholesterol, Total: 147 mg/dL (ref 100–199)
HDL: 47 mg/dL (ref >39)
LDL Chol Calc (NIH): 87 mg/dL (ref 0–99)
Triglycerides: 65 mg/dL (ref 0–149)
VLDL Cholesterol Cal: 13 mg/dL (ref 5–40)

## 2021-05-27 ENCOUNTER — Emergency Department
Admission: EM | Admit: 2021-05-27 | Discharge: 2021-05-27 | Disposition: A | Discharge status: Home / Self Care | Payer: Managed Care, Other (non HMO) | Attending: Emergency Medicine | Admitting: Emergency Medicine

## 2021-05-27 ENCOUNTER — Other Ambulatory Visit: Payer: Self-pay

## 2021-05-27 DIAGNOSIS — M5412 Radiculopathy, cervical region: Secondary | ICD-10-CM | POA: Insufficient documentation

## 2021-05-27 DIAGNOSIS — M542 Cervicalgia: Secondary | ICD-10-CM

## 2021-05-27 DIAGNOSIS — M25512 Pain in left shoulder: Secondary | ICD-10-CM | POA: Insufficient documentation

## 2021-05-27 DIAGNOSIS — Z7982 Long term (current) use of aspirin: Secondary | ICD-10-CM | POA: Diagnosis not present

## 2021-05-27 MED ORDER — ORPHENADRINE CITRATE 30 MG/ML IJ SOLN
60.0000 mg | Freq: Once | INTRAMUSCULAR | Status: AC
  Administered 2021-05-27: 60 mg via INTRAVENOUS
  Filled 2021-05-27: qty 2

## 2021-05-27 MED ORDER — CYCLOBENZAPRINE HCL 5 MG PO TABS: 5.0000 mg | ORAL_TABLET | Freq: Three times a day (TID) | ORAL | 0 refills | Status: DC | PRN

## 2021-05-27 MED ORDER — HYDROCODONE-ACETAMINOPHEN 5-325 MG PO TABS: 1.0000 | ORAL_TABLET | ORAL | 0 refills | Status: DC | PRN

## 2021-05-27 MED ORDER — PREDNISONE 10 MG PO TABS: 10.0000 mg | ORAL_TABLET | Freq: Every day | ORAL | 0 refills | Status: DC

## 2021-05-27 MED ORDER — DEXAMETHASONE SODIUM PHOSPHATE 10 MG/ML IJ SOLN
10.0000 mg | Freq: Once | INTRAMUSCULAR | Status: AC
  Administered 2021-05-27: 10 mg via INTRAVENOUS
  Filled 2021-05-27: qty 1

## 2021-05-27 MED ORDER — MORPHINE SULFATE (PF) 4 MG/ML IV SOLN
4.0000 mg | Freq: Once | INTRAVENOUS | Status: AC
  Administered 2021-05-27: 4 mg via INTRAVENOUS
  Filled 2021-05-27: qty 1

## 2021-05-27 NOTE — Discharge Instructions (Signed)
Please take prednisone taper as prescribed.  May use Flexeril and Norco as needed for muscle spasm/pain.  Call rheumatologist or primary care provider to schedule follow-up appointment.  Return to the ER for any weakness worsening symptoms or changes in her health.

## 2021-05-27 NOTE — ED Triage Notes (Signed)
Pt presents via POV c/o right sided should and neck pain. Reports had xrays completed by Rheumatology MD. Reports hx RA. Ambulatory to triage. Reports pain with moving neck.

## 2021-05-27 NOTE — ED Provider Notes (Signed)
[name]: [id number] Arrival date & time: 05/27/21  1924     History  Chief Complaint  Patient presents with   Neck Pain   Shoulder Pain    Isabella Burch is a 51 y.o. female.  With a history of rheumatoid arthritis presents to the emergency department for evaluation of acute on chronic neck pain with right-sided cervical radiculopathy.  She has had some chronic neck pain for at least a month, today pain is increased with burning and numbness going down the right bicep and radial aspect of the forearm.  She denies any trauma or injury.  No weakness or sensation loss.  No lower extremity weakness.  She feels that the neck is tight on the right side and she feels comfortable in a position where her head is tilted to the left.  She denies any left-sided cervical symptoms.  She has not had any medications for her current symptoms.  HPI     Home Medications Prior to Admission medications   Medication Sig Start Date End Date Taking? Authorizing Provider  acetaminophen (TYLENOL) 500 MG tablet Take 500 mg by mouth every 6 (six) hours as needed for moderate pain, mild pain, headache or fever.    [provider]  aspirin EC 81 MG EC tablet Take 1 tablet (81 mg total) by mouth daily. Swallow whole. 12/12/20   Dwyane Dee, MD  atorvastatin (LIPITOR) 40 MG tablet Take 1 tablet (40 mg total) by mouth daily. 03/28/21 06/26/21  Theora Gianotti, NP  DULoxetine (CYMBALTA) 60 MG capsule Take 1 capsule (60 mg total) by mouth daily. 05/01/21   Flinchum, Kelby Aline, FNP  fluticasone (FLONASE) 50 MCG/ACT nasal spray SPRAY 2 SPRAYS INTO EACH NOSTRIL EVERY DAY 05/01/21   Flinchum, Kelby Aline, FNP  leflunomide (ARAVA) 10 MG tablet TAKE 1 TABLET BY MOUTH ONCE A DAY 07/17/14   [provider]  lisinopril (ZESTRIL) 2.5 MG tablet Take 1 tablet (2.5 mg total) by mouth daily. 05/01/21 04/26/22  Flinchum, Kelby Aline, FNP  meclizine  (ANTIVERT) 25 MG tablet Take 1 tablet (25 mg total) by mouth 3 (three) times daily as needed for dizziness. 04/27/20   Vanessa [place] Beaver, MD  metoprolol succinate (TOPROL-XL) 25 MG 24 hr tablet Take 0.5 tablets (12.5 mg total) by mouth daily. 12/12/20   Dwyane Dee, MD  ondansetron (ZOFRAN ODT) 4 MG disintegrating tablet Take 1 tablet (4 mg total) by mouth every 8 (eight) hours as needed for nausea or vomiting. 04/27/20   Vanessa [place], MD  ORENCIA CLICKJECT 0000000 MG/ML SOAJ Inject 1 Syringe into the skin once a week. 05/30/19   [provider]  VITAMIN D PO Take by mouth. 4000IU    [provider]      Allergies    Enbrel [etanercept] and Humira [adalimumab]    Review of Systems   Review of Systems  Constitutional:  Negative for chills and fever.  Musculoskeletal:  Negative for myalgias.  Skin:  Negative for rash and wound.  Neurological:  Negative for weakness.   Physical Exam Updated Vital Signs BP (!) 149/99    Pulse 80    Temp 98.7 F (37.1 C) (Oral)    Resp 16    SpO2 100%  Physical Exam Constitutional:      Appearance: She is well-developed.  HENT:     Head: Normocephalic and atraumatic.     Nose: Nose normal.     Mouth/Throat:  Pharynx: Oropharynx is clear.  Eyes:     Conjunctiva/sclera: Conjunctivae normal.  Cardiovascular:     Rate and Rhythm: Normal rate.  Pulmonary:     Effort: Pulmonary effort is normal. No respiratory distress.  Musculoskeletal:        General: Normal range of motion.     Cervical back: Normal range of motion.     Comments: Mild right paravertebral muscle tenderness with tension.  Mild muscle spasms palpated.  No cervical spinous process tenderness.  She has no weakness in the upper extremities with supraspinatus bicep tricep and grip strength.  She has full active range of motion of both shoulders.  She has positive Spurling's test to the right.  She is neurovascular intact in right upper extremity with no sensation loss.  No clonus  noted in the lower extremities  Skin:    General: Skin is warm.     Findings: No rash.  Neurological:     Mental Status: She is alert and oriented to person, place, and time.  Psychiatric:        Behavior: Behavior normal.        Thought Content: Thought content normal.    ED Results / Procedures / Treatments   Labs (all labs ordered are listed, but only abnormal results are displayed) Labs Reviewed - No data to display  EKG None  Radiology No results found.  Procedures Procedures    Medications Ordered in ED Medications  morphine (PF) 4 MG/ML injection 4 mg (has no administration in time range)  orphenadrine (NORFLEX) injection 60 mg (has no administration in time range)  dexamethasone (DECADRON) injection 10 mg (has no administration in time range)    ED Course/ Medical Decision Making/ A&P                           Medical Decision Making Risk Prescription drug management.   51 year old female with acute right-sided cervical pain with right-sided cervical radiculopathy.  She was seen with similar symptoms 1 month ago, over the last few days her symptoms increased.  She denies any weakness but does have burning and pins-and-needles sensation throughout the right arm.  No trauma or injury.  Vital signs are stable, afebrile.  Patient symptoms resolved with morphine, dexamethasone, Norflex.  Patient is stable and ready for discharge to home and will be discharged with a 6-day steroid taper, Norco and a muscle relaxer.  Patient will follow-up with PCP or rheumatologist in 1 week if no relief. Final Clinical Impression(s) / ED Diagnoses Final diagnoses:  Cervicalgia  Right cervical radiculopathy    Rx / DC Orders ED Discharge Orders     None         Renata Caprice 05/27/21 2142    Blake Divine, MD 05/27/21 2352

## 2021-05-27 NOTE — ED Notes (Signed)
See triage note. Pt with Hx of rheumatoid arthritis. Pain began approx 1 hour before arrival. Pain described as numbness with ""pins and needles" extending from right shoulder to just below right elbow.

## 2021-05-29 ENCOUNTER — Telehealth: Payer: Self-pay

## 2021-05-29 NOTE — Telephone Encounter (Signed)
Lvm for pt to return call to schedule a hospital f/u appt with [name] in either 1-2 weeks, or for her to f/u with her Rheumatologist.

## 2021-05-30 ENCOUNTER — Telehealth: Payer: Self-pay

## 2021-05-30 NOTE — Telephone Encounter (Signed)
Called pt x2 lvm x2 for pt to schedule ER f/u with Sharyn Lull or rheumatologist in 1-2 weeks.

## 2021-06-03 ENCOUNTER — Ambulatory Visit: Payer: Managed Care, Other (non HMO) | Admitting: Nurse Practitioner

## 2021-06-03 ENCOUNTER — Encounter: Payer: Self-pay | Admitting: Nurse Practitioner

## 2021-06-03 NOTE — Progress Notes (Unsigned)
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 11/2020 CTA chest: 3.8cm asc Ao.   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 11/2020 Echo: EF 20-25%; b. 02/2021 Echo: EF 55-60%, no rwma.   Extensor tendon rupture, non-traumatic, hand and wrist    right   Heart murmur    History of kidney stones    Hypertension    Nonobstructive CAD (coronary artery disease)    a. 11/2020 NSTEMI/Cath: LM nl, LAD 38m, D1/2 nl, LCX min irregs, RCA nl, RPDA/RPAV nl.   Rheumatoid arthritis (HCC) dx 2006   BILATERAL HANDS AND RIGHT ANKLE   Takotsubo cardiomyopathy    a. 11/2020 Echo: EF 20-25%, mid-apical ant, inf, and apical AK. Nl RV size/fxn. Mild MR; b. 11/2020 Cath: nonobs dzs; c. 02/2021 Echo: EF 55-60%, no rwma, nl RV fxn, Mild MR, Asc Ao 70mm.   Tobacco abuse    Past Surgical History:  Procedure Laterality Date   CARDIAC VALVE REPLACEMENT     CYSTOSCOPY WITH URETEROSCOPY, STONE BASKETRY AND STENT PLACEMENT  2011   LEFT HEART CATH AND CORONARY ANGIOGRAPHY N/A 12/10/2020   Procedure: LEFT HEART CATH AND CORONARY ANGIOGRAPHY;  Surgeon: Iran Ouch, MD;  Location: [facility] INVASIVE CV LAB;  Service: Cardiovascular;  Laterality: N/A;   LEFT WRIST EXTENSOR TENDON REPAIR     TENDON TRANSFER Right 11/09/2014   Procedure: RIGHT THUMB EIP TO EPL TENDON TRANSFER;  Surgeon: Bradly Bienenstock, MD;  Location: [facility] [place];  Service: Orthopedics;  Laterality: Right;   TUBAL  LIGATION      Allergies  Allergies  Allergen Reactions   Enbrel [Etanercept] Swelling   Humira [Adalimumab] Other (See Comments)    kidney dysfunction    History of Present Illness    51 year old female with the above past medical history including Takotsubo cardiomyopathy, chronic heart failure with improved ejection fraction, nonobstructive CAD, hyperlipidemia, hypertension, anxiety, arthritis, and tobacco abuse.  In August 2022, she had sudden onset of substernal chest heaviness while at church.  She was taken to the [facility] ED where ECG showed subtle anterior ST segment elevation.  CT angio of the chest was negative for PE, though she was noted to have mild dilation of the ascending aorta at 3.8 cm.  Troponin rose to 3284.  Echo showed an EF of 20 to 25% with mid-apical anterior, inferior, and apical akinesis consistent with Takotsubo cardiomyopathy.  Diagnostic catheterization showed minimal, nonobstructive CAD, and medical therapy was recommended.  She was placed on guideline directed medical therapy and did require diuresis.  She did well in the outpatient setting and follow-up echo in November 2022, showed improvement in ejection fraction to 55 to 60%.  Home Medications    Current Outpatient Medications  Medication Sig Dispense Refill   acetaminophen (TYLENOL) 500 MG tablet Take 500 mg by mouth every 6 (six) hours as needed for moderate pain, mild pain, headache or fever.     aspirin EC 81 MG EC tablet Take  1 tablet (81 mg total) by mouth daily. Swallow whole. 30 tablet 11   atorvastatin (LIPITOR) 40 MG tablet Take 1 tablet (40 mg total) by mouth daily. 90 tablet 3   cyclobenzaprine (FLEXERIL) 5 MG tablet Take 1-2 tablets (5-10 mg total) by mouth 3 (three) times daily as needed for muscle spasms. 20 tablet 0   DULoxetine (CYMBALTA) 60 MG capsule Take 1 capsule (60 mg total) by mouth daily. 90 capsule 0   fluticasone (FLONASE) 50 MCG/ACT nasal spray SPRAY 2 SPRAYS INTO EACH NOSTRIL EVERY  DAY 16 mL 1   HYDROcodone-acetaminophen (NORCO) 5-325 MG tablet Take 1 tablet by mouth every 4 (four) hours as needed for moderate pain. 10 tablet 0   leflunomide (ARAVA) 10 MG tablet TAKE 1 TABLET BY MOUTH ONCE A DAY     lisinopril (ZESTRIL) 2.5 MG tablet Take 1 tablet (2.5 mg total) by mouth daily. 90 tablet 3   meclizine (ANTIVERT) 25 MG tablet Take 1 tablet (25 mg total) by mouth 3 (three) times daily as needed for dizziness. 30 tablet 0   metoprolol succinate (TOPROL-XL) 25 MG 24 hr tablet Take 0.5 tablets (12.5 mg total) by mouth daily. 30 tablet 3   ondansetron (ZOFRAN ODT) 4 MG disintegrating tablet Take 1 tablet (4 mg total) by mouth every 8 (eight) hours as needed for nausea or vomiting. 20 tablet 0   ORENCIA CLICKJECT 125 MG/ML SOAJ Inject 1 Syringe into the skin once a week.     predniSONE (DELTASONE) 10 MG tablet Take 1 tablet (10 mg total) by mouth daily. 6,5,4,3,2,1 six day taper 21 tablet 0   VITAMIN D PO Take by mouth. 4000IU     No current facility-administered medications for this visit.     Review of Systems    ***.  All other systems reviewed and are otherwise negative except as noted above.    Physical Exam    VS:  There were no vitals taken for this visit. , BMI There is no height or weight on file to calculate BMI.     GEN: Well nourished, well developed, in no acute distress. HEENT: normal. Neck: Supple, no JVD, carotid bruits, or masses. Cardiac: RRR, no murmurs, rubs, or gallops. No clubbing, cyanosis, edema.  Radials/DP/PT 2+ and equal bilaterally.  Respiratory:  Respirations regular and unlabored, clear to auscultation bilaterally. GI: Soft, nontender, nondistended, BS + x 4. MS: no deformity or atrophy. Skin: warm and dry, no rash. Neuro:  Strength and sensation are intact. Psych: Normal affect.  Accessory Clinical Findings    ECG personally reviewed by me today - *** - no acute changes.  Lab Results  Component Value Date   WBC 5.5 12/11/2020    HGB 12.9 12/11/2020   HCT 38.9 12/11/2020   MCV 97.3 12/11/2020   PLT 257 12/11/2020   Lab Results  Component Value Date   CREATININE 0.89 12/28/2020   BUN 11 12/28/2020   NA 142 12/28/2020   K 4.5 12/28/2020   CL 103 12/28/2020   CO2 26 12/28/2020   Lab Results  Component Value Date   ALT 15 05/24/2021   AST 16 05/24/2021   ALKPHOS 108 05/24/2021   BILITOT 0.3 05/24/2021   Lab Results  Component Value Date   CHOL 147 05/24/2021   HDL 47 05/24/2021   LDLCALC 87 05/24/2021   TRIG 65 05/24/2021   CHOLHDL 3.1 05/24/2021    Lab Results  Component Value Date   HGBA1C 5.7 (H) 12/10/2020  Assessment & Plan    1.  ***   Nicolasa Ducking, NP 06/03/2021, 8:02 AM

## 2021-06-09 ENCOUNTER — Encounter: Payer: Self-pay | Admitting: Adult Health

## 2021-06-10 ENCOUNTER — Ambulatory Visit: Payer: Managed Care, Other (non HMO) | Admitting: Adult Health

## 2021-06-10 ENCOUNTER — Other Ambulatory Visit: Payer: Self-pay | Admitting: Nurse Practitioner

## 2021-09-05 NOTE — Progress Notes (Unsigned)
New patient visit   Patient: Isabella Burch   DOB: [birth year]   51 y.o. Female  MRN: [id number] Visit Date: 09/10/2021  Today's healthcare provider: Jacky Kindle, FNP   No chief complaint on file.  Subjective    Isabella Burch is a 51 y.o. female who presents today as a new patient to establish care.  HPI  ***  Past Medical History:  Diagnosis Date   Anxiety    Ascending aorta dilatation (HCC)    a. 11/2020 CTA chest: 3.8cm asc Ao.   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 11/2020 Echo: EF 20-25%; b. 02/2021 Echo: EF 55-60%, no rwma.   Extensor tendon rupture, non-traumatic, hand and wrist    right   Heart murmur    History of kidney stones    Hypertension    Nonobstructive CAD (coronary artery disease)    a. 11/2020 NSTEMI/Cath: LM nl, LAD 68m, D1/2 nl, LCX min irregs, RCA nl, RPDA/RPAV nl.   Rheumatoid arthritis (HCC) dx 2006   BILATERAL HANDS AND RIGHT ANKLE   Takotsubo cardiomyopathy    a. 11/2020 Echo: EF 20-25%, mid-apical ant, inf, and apical AK. Nl RV size/fxn. Mild MR; b. 11/2020 Cath: nonobs dzs; c. 02/2021 Echo: EF 55-60%, no rwma, nl RV fxn, Mild MR, Asc Ao 36mm.   Tobacco abuse    Past Surgical History:  Procedure Laterality Date   CARDIAC VALVE REPLACEMENT     CYSTOSCOPY WITH URETEROSCOPY, STONE BASKETRY AND STENT PLACEMENT  2011   LEFT HEART CATH AND CORONARY ANGIOGRAPHY N/A 12/10/2020   Procedure: LEFT HEART CATH AND CORONARY ANGIOGRAPHY;  Surgeon: Iran Ouch, MD;  Location: [facility] INVASIVE CV [facility];  Service: Cardiovascular;  Laterality: N/A;   LEFT WRIST EXTENSOR TENDON REPAIR     TENDON TRANSFER Right 11/09/2014   Procedure: RIGHT THUMB EIP TO EPL TENDON TRANSFER;  Surgeon: Bradly Bienenstock, MD;  Location: [facility];  Service: Orthopedics;  Laterality: Right;   TUBAL LIGATION     Family Status  Relation Name Status   Mother  Alive   Father  Deceased   Sister  Alive   Sister  Alive   Daughter  Alive    Son  Alive   Family History  Problem Relation Age of Onset   Hypothyroidism Mother    Coronary artery disease Mother    Hyperlipidemia Father    Hypertension Father    Diabetes Father    Social History   Socioeconomic History   Marital status: Married    Spouse name: Jameelah Watts   Number of children: 2   Years of education: 12   Highest education level: Not on file  Occupational History   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    Employer: [employer]    Comment: Engineer, maintenance (IT)   Occupation: Estate agent    Comment: Part-time job  Tobacco Use   Smoking status: Every Day    Packs/day: 0.50    Years: 25.00    Pack years: 12.50    Types: Cigarettes   Smokeless tobacco: Never   Tobacco comments:    0.5 ppd for much of her life but currently smoking 2-4 cigarettes/day (11/2020)  Substance and Sexual Activity   Alcohol use: No    Alcohol/week: 0.0 standard drinks   Drug use: No   Sexual activity: Not on file  Other Topics Concern   Not on file  Social History Narrative   Isabella Burch grew  up in [place]. She lives at home with her husband, two children and a granddaughter. They have 2 dogs (great pyreneees and dachbull).  She works at the [employer] in [employer]. She enjoys working and being around children.    Social Determinants of Health   Financial Resource Strain: Not on file  Food Insecurity: Not on file  Transportation Needs: Not on file  Physical Activity: Not on file  Stress: Not on file  Social Connections: Not on file   Outpatient Medications Prior to Visit  Medication Sig   acetaminophen (TYLENOL) 500 MG tablet Take 500 mg by mouth every 6 (six) hours as needed for moderate pain, mild pain, headache or fever.   aspirin EC 81 MG EC tablet Take 1 tablet (81 mg total) by mouth daily. Swallow whole.   atorvastatin (LIPITOR) 40 MG tablet Take 1 tablet (40 mg total) by mouth daily.   cyclobenzaprine (FLEXERIL) 5 MG tablet Take  1-2 tablets (5-10 mg total) by mouth 3 (three) times daily as needed for muscle spasms.   DULoxetine (CYMBALTA) 60 MG capsule Take 1 capsule (60 mg total) by mouth daily.   fluticasone (FLONASE) 50 MCG/ACT nasal spray SPRAY 2 SPRAYS INTO EACH NOSTRIL EVERY DAY   HYDROcodone-acetaminophen (NORCO) 5-325 MG tablet Take 1 tablet by mouth every 4 (four) hours as needed for moderate pain.   leflunomide (ARAVA) 10 MG tablet TAKE 1 TABLET BY MOUTH ONCE A DAY   lisinopril (ZESTRIL) 2.5 MG tablet Take 1 tablet (2.5 mg total) by mouth daily.   meclizine (ANTIVERT) 25 MG tablet Take 1 tablet (25 mg total) by mouth 3 (three) times daily as needed for dizziness.   metoprolol succinate (TOPROL-XL) 25 MG 24 hr tablet Take 0.5 tablets (12.5 mg total) by mouth daily.   ondansetron (ZOFRAN ODT) 4 MG disintegrating tablet Take 1 tablet (4 mg total) by mouth every 8 (eight) hours as needed for nausea or vomiting.   ORENCIA CLICKJECT 125 MG/ML SOAJ Inject 1 Syringe into the skin once a week.   predniSONE (DELTASONE) 10 MG tablet Take 1 tablet (10 mg total) by mouth daily. 6,5,4,3,2,1 six day taper   VITAMIN D PO Take by mouth. 4000IU   No facility-administered medications prior to visit.   Allergies  Allergen Reactions   Enbrel [Etanercept] Swelling   Humira [Adalimumab] Other (See Comments)    kidney dysfunction     There is no immunization history on file for this patient.  Health Maintenance  Topic Date Due   COVID-19 Vaccine (1) Never done   HIV Screening  Never done   Hepatitis C Screening  Never done   TETANUS/TDAP  Never done   COLONOSCOPY (Pts 45-50yrs Insurance coverage will need to be confirmed)  Never done   INFLUENZA VACCINE  11/19/2021   PAP SMEAR-Modifier  05/09/2022   HPV VACCINES  Aged Out    Patient Care Team: Flinchum, Eula Fried, FNP as PCP - General (Family Medicine) Iran Ouch, MD as PCP - Cardiology (Cardiology)  Review of Systems  {Labs  Heme  Chem  Endocrine   Serology  Results Review (optional):23779}   Objective    There were no vitals taken for this visit. {Show previous vital signs (optional):23777}  Physical Exam ***  Depression Screen    05/01/2021   10:40 AM 05/01/2021   10:27 AM 02/07/2020    9:19 AM 04/05/2019   10:35 AM  PHQ 2/9 Scores  PHQ - 2 Score 4 0 2  2  PHQ- 9 Score 20  10 4    No results found for any visits on 09/10/21.  Assessment & Plan     ***  No follow-ups on file.     {provider attestation***:1}   Jacky Kindle, FNP  [facility] [phone] (phone) [phone] (fax)  [facility]

## 2021-09-06 ENCOUNTER — Ambulatory Visit: Payer: Managed Care, Other (non HMO) | Admitting: Physician Assistant

## 2021-09-10 ENCOUNTER — Encounter: Payer: Self-pay | Admitting: Family Medicine

## 2021-09-10 ENCOUNTER — Ambulatory Visit: Payer: BC Managed Care – PPO | Admitting: Family Medicine

## 2021-09-10 VITALS — BP 113/78 | HR 91 | Temp 97.8°F | Resp 16 | Ht 66.0 in | Wt 144.5 lb

## 2021-09-10 DIAGNOSIS — M069 Rheumatoid arthritis, unspecified: Secondary | ICD-10-CM | POA: Diagnosis not present

## 2021-09-10 DIAGNOSIS — Z114 Encounter for screening for human immunodeficiency virus [HIV]: Secondary | ICD-10-CM | POA: Insufficient documentation

## 2021-09-10 DIAGNOSIS — Z1159 Encounter for screening for other viral diseases: Secondary | ICD-10-CM

## 2021-09-10 DIAGNOSIS — F339 Major depressive disorder, recurrent, unspecified: Secondary | ICD-10-CM

## 2021-09-10 DIAGNOSIS — I5022 Chronic systolic (congestive) heart failure: Secondary | ICD-10-CM | POA: Diagnosis not present

## 2021-09-10 DIAGNOSIS — Z9189 Other specified personal risk factors, not elsewhere classified: Secondary | ICD-10-CM | POA: Insufficient documentation

## 2021-09-10 DIAGNOSIS — Z23 Encounter for immunization: Secondary | ICD-10-CM | POA: Insufficient documentation

## 2021-09-10 DIAGNOSIS — Z111 Encounter for screening for respiratory tuberculosis: Secondary | ICD-10-CM | POA: Insufficient documentation

## 2021-09-10 DIAGNOSIS — E785 Hyperlipidemia, unspecified: Secondary | ICD-10-CM

## 2021-09-10 MED ORDER — METOPROLOL SUCCINATE ER 25 MG PO TB24: 12.5000 mg | ORAL_TABLET | Freq: Every day | ORAL | 3 refills | Status: DC

## 2021-09-10 MED ORDER — DULOXETINE HCL 60 MG PO CPEP: 60.0000 mg | ORAL_CAPSULE | Freq: Every day | ORAL | 3 refills | Status: DC

## 2021-09-10 NOTE — Assessment & Plan Note (Signed)
Hx of NSTEMI; on statin- Lipitor at 40 mg Encouraged smoking cessation when "ready" LDL goal <70 I recommend diet low in saturated fat and regular exercise - 30 min at least 5 times per week

## 2021-09-10 NOTE — Assessment & Plan Note (Signed)
Low risk screen Treatable, and curable. If left untreated Hep C can lead to cirrhosis and liver failure. Encourage routine testing; recommend repeat testing if risk factors change.  

## 2021-09-10 NOTE — Assessment & Plan Note (Signed)
Chronic, stable Due for refill of beta blocker Followed by cardiology Denies any complaints

## 2021-09-10 NOTE — Assessment & Plan Note (Signed)
Screening needed for employment purposes; low risk

## 2021-09-10 NOTE — Assessment & Plan Note (Signed)
Last completed in 1992; VIS available, consent received and reviewed

## 2021-09-10 NOTE — Assessment & Plan Note (Signed)
Chronic, stable Wishes to continue Cymbalta 60 mg QD

## 2021-09-10 NOTE — Assessment & Plan Note (Signed)
Chronic, stable Followed by rheumatology

## 2021-09-10 NOTE — Assessment & Plan Note (Signed)
Low risk screen ?Consented; encouraged to "know your status" ?Recommend repeat screen if risk factors change ? ?

## 2021-09-14 LAB — CBC WITH DIFFERENTIAL/PLATELET
Basophils Absolute: 0.1 10*3/uL (ref 0.0–0.2)
Basos: 1 %
EOS (ABSOLUTE): 0.5 10*3/uL — ABNORMAL HIGH (ref 0.0–0.4)
Eos: 9 %
Hematocrit: 37.4 % (ref 34.0–46.6)
Hemoglobin: 12.5 g/dL (ref 11.1–15.9)
Immature Grans (Abs): 0 10*3/uL (ref 0.0–0.1)
Immature Granulocytes: 0 %
Lymphocytes Absolute: 1.3 10*3/uL (ref 0.7–3.1)
Lymphs: 23 %
MCH: 32.5 pg (ref 26.6–33.0)
MCHC: 33.4 g/dL (ref 31.5–35.7)
MCV: 97 fL (ref 79–97)
Monocytes Absolute: 0.6 10*3/uL (ref 0.1–0.9)
Monocytes: 10 %
Neutrophils Absolute: 3.1 10*3/uL (ref 1.4–7.0)
Neutrophils: 57 %
Platelets: 359 10*3/uL (ref 150–450)
RBC: 3.85 x10E6/uL (ref 3.77–5.28)
RDW: 12.1 % (ref 11.7–15.4)
WBC: 5.5 10*3/uL (ref 3.4–10.8)

## 2021-09-14 LAB — COMPREHENSIVE METABOLIC PANEL
ALT: 19 IU/L (ref 0–32)
AST: 21 IU/L (ref 0–40)
Albumin/Globulin Ratio: 1.8 (ref 1.2–2.2)
Albumin: 4.2 g/dL (ref 3.8–4.8)
Alkaline Phosphatase: 107 IU/L (ref 44–121)
BUN/Creatinine Ratio: 21 (ref 9–23)
BUN: 20 mg/dL (ref 6–24)
Bilirubin Total: 0.2 mg/dL (ref 0.0–1.2)
CO2: 26 mmol/L (ref 20–29)
Calcium: 9.3 mg/dL (ref 8.7–10.2)
Chloride: 104 mmol/L (ref 96–106)
Creatinine, Ser: 0.95 mg/dL (ref 0.57–1.00)
Globulin, Total: 2.3 g/dL (ref 1.5–4.5)
Glucose: 86 mg/dL (ref 70–99)
Potassium: 4 mmol/L (ref 3.5–5.2)
Sodium: 143 mmol/L (ref 134–144)
Total Protein: 6.5 g/dL (ref 6.0–8.5)
eGFR: 73 mL/min/{1.73_m2} (ref >59)

## 2021-09-14 LAB — QUANTIFERON-TB GOLD PLUS
QuantiFERON Mitogen Value: >10 IU/mL
QuantiFERON Nil Value: 0 IU/mL
QuantiFERON TB1 Ag Value: 0.01 IU/mL
QuantiFERON TB2 Ag Value: 0.02 IU/mL
QuantiFERON-TB Gold Plus: NEGATIVE

## 2021-09-14 LAB — LIPID PANEL
Chol/HDL Ratio: 2.4 ratio (ref 0.0–4.4)
Cholesterol, Total: 135 mg/dL (ref 100–199)
HDL: 57 mg/dL (ref >39)
LDL Chol Calc (NIH): 62 mg/dL (ref 0–99)
Triglycerides: 83 mg/dL (ref 0–149)
VLDL Cholesterol Cal: 16 mg/dL (ref 5–40)

## 2021-09-14 LAB — HEPATITIS C ANTIBODY: Hep C Virus Ab: NONREACTIVE

## 2021-09-14 LAB — HIV ANTIBODY (ROUTINE TESTING W REFLEX): HIV Screen 4th Generation wRfx: NONREACTIVE

## 2021-09-26 ENCOUNTER — Encounter: Payer: Self-pay | Admitting: Family Medicine

## 2021-09-26 ENCOUNTER — Ambulatory Visit: Payer: Self-pay | Admitting: *Deleted

## 2021-09-26 ENCOUNTER — Ambulatory Visit (INDEPENDENT_AMBULATORY_CARE_PROVIDER_SITE_OTHER): Payer: BC Managed Care – PPO | Admitting: Family Medicine

## 2021-09-26 VITALS — BP 140/87 | HR 66 | Temp 98.2°F | Resp 14 | Wt 146.0 lb

## 2021-09-26 DIAGNOSIS — M25511 Pain in right shoulder: Secondary | ICD-10-CM | POA: Insufficient documentation

## 2021-09-26 MED ORDER — METHOCARBAMOL 750 MG PO TABS: 750.0000 mg | ORAL_TABLET | Freq: Two times a day (BID) | ORAL | 0 refills | Status: DC | PRN

## 2021-09-26 MED ORDER — MELOXICAM 15 MG PO TABS: 15.0000 mg | ORAL_TABLET | Freq: Every day | ORAL | 0 refills | Status: DC

## 2021-09-26 NOTE — Telephone Encounter (Signed)
FYI

## 2021-09-26 NOTE — Telephone Encounter (Signed)
  Chief Complaint: shoulder pain Symptoms: Right shoulder pain,intermittent for months, ow with radiation right upper back. States tingling sensation when turns head to right. 8/10 pain Frequency: Months ago off and on, Recent flare up last Friday Pertinent Negatives: Patient denies  Disposition: [] ED /[] Urgent Care (no appt availability in office) / [x] Appointment(In office/virtual)/ []  [facility]/ [] Home Care/ [] Refused Recommended Disposition /[] [facility]/ []  Follow-up with PCP Additional Notes: Appt secured for today, care advise provided. Reason for Disposition  Numbness (i.e., loss of sensation) in hand or fingers    Tingling upper back, no numbness  Answer Assessment - Initial Assessment Questions 1. ONSET: "When did the pain start?"     Shoulder pain last Friday 2. LOCATION: "Where is the pain located?"     Right shoulder, radiates to upper back, right side 3. PAIN: "How bad is the pain?" (Scale 1-10; or mild, moderate, severe)   - MILD (1-3): doesn't interfere with normal activities   - MODERATE (4-7): interferes with normal activities (e.g., work or school) or awakens from sleep   - SEVERE (8-10): excruciating pain, unable to do any normal activities, unable to move arm at all due to pain     8/10 4. WORK OR EXERCISE: "Has there been any recent work or exercise that involved this part of the body?"     No 5. CAUSE: "What do you think is causing the shoulder pain?"     Unsure 6. OTHER SYMPTOMS: "Do you have any other symptoms?" (e.g., neck pain, swelling, rash, fever, numbness, weakness)     Tingling down back when turns head to right  Protocols used: Shoulder Pain-A-AH

## 2021-09-26 NOTE — Progress Notes (Signed)
I,Roshena L Chambers,acting as a scribe for Jacky Kindle, FNP.,have documented all relevant documentation on the behalf of Jacky Kindle, FNP,as directed by  Jacky Kindle, FNP while in the presence of Jacky Kindle, FNP.   Established patient visit   Patient: Isabella Burch   DOB: 1970-11-24   51 y.o. Female  MRN: [id number] Visit Date: 09/26/2021  Today's healthcare provider: Jacky Kindle, FNP  Re Introduced to nurse practitioner role and practice setting.  All questions answered.  Discussed provider/patient relationship and expectations.   Chief Complaint  Patient presents with   Shoulder Pain   Subjective    Shoulder Pain  The pain is present in the right shoulder (pain radiates to right upper back and then down her spine). This is a new problem. Episode onset: 6 days ago. The problem occurs intermittently. Quality: tingling. Pertinent negatives include no fever. Exacerbated by: sitting. She has tried heat and acetaminophen for the symptoms. The treatment provided no relief.    Patient reports feeling a tingling sensation when turning her head to the right.  Medications: Outpatient Medications Prior to Visit  Medication Sig   acetaminophen (TYLENOL) 500 MG tablet Take 500 mg by mouth every 6 (six) hours as needed for moderate pain, mild pain, headache or fever.   aspirin EC 81 MG EC tablet Take 1 tablet (81 mg total) by mouth daily. Swallow whole.   DULoxetine (CYMBALTA) 60 MG capsule Take 1 capsule (60 mg total) by mouth daily.   fluticasone (FLONASE) 50 MCG/ACT nasal spray SPRAY 2 SPRAYS INTO EACH NOSTRIL EVERY DAY   leflunomide (ARAVA) 10 MG tablet TAKE 1 TABLET BY MOUTH ONCE A DAY   lisinopril (ZESTRIL) 2.5 MG tablet Take 1 tablet (2.5 mg total) by mouth daily.   meclizine (ANTIVERT) 25 MG tablet Take 1 tablet (25 mg total) by mouth 3 (three) times daily as needed for dizziness.   metoprolol succinate (TOPROL-XL) 25 MG 24 hr tablet Take 0.5 tablets (12.5 mg total) by  mouth daily.   ondansetron (ZOFRAN ODT) 4 MG disintegrating tablet Take 1 tablet (4 mg total) by mouth every 8 (eight) hours as needed for nausea or vomiting.   ORENCIA CLICKJECT 125 MG/ML SOAJ Inject 1 Syringe into the skin once a week.   VITAMIN D PO Take by mouth. 4000IU   atorvastatin (LIPITOR) 40 MG tablet Take 1 tablet (40 mg total) by mouth daily.   No facility-administered medications prior to visit.    Review of Systems  Constitutional:  Negative for appetite change, chills, fatigue and fever.  Respiratory:  Negative for chest tightness and shortness of breath.   Cardiovascular:  Negative for chest pain and palpitations.  Gastrointestinal:  Negative for abdominal pain, nausea and vomiting.  Musculoskeletal:  Positive for arthralgias (right shoulder) and myalgias (right shoulder).  Neurological:  Negative for dizziness and weakness.       Objective    BP 140/87 (BP Location: Left Arm, Patient Position: Sitting, Cuff Size: Large)   Pulse 66   Temp 98.2 F (36.8 C) (Oral)   Resp 14   Wt 146 lb (66.2 kg)   SpO2 98% Comment: room air  BMI 23.57 kg/m   Today's Vitals   09/26/21 1557 09/26/21 1604  BP: (!) 137/92 140/87  Pulse: 66   Resp: 14   Temp: 98.2 F (36.8 C)   TempSrc: Oral   SpO2: 98%   Weight: 146 lb (66.2 kg)   PainSc: 8  PainLoc: Shoulder    Body mass index is 23.57 kg/m.    Physical Exam Vitals and nursing note reviewed.  Constitutional:      General: She is not in acute distress.    Appearance: Normal appearance. She is normal weight. She is not ill-appearing, toxic-appearing or diaphoretic.  HENT:     Head: Normocephalic and atraumatic.  Neck:     Vascular: No carotid bruit.      Comments: Decreased ROM to right; denies trauma or injury. Previously had similar presentation in 2/23  Cardiovascular:     Rate and Rhythm: Normal rate and regular rhythm.     Pulses: Normal pulses.     Heart sounds: Murmur heard.     No friction rub. No  gallop.  Pulmonary:     Effort: Pulmonary effort is normal. No respiratory distress.     Breath sounds: Normal breath sounds. No stridor. No wheezing, rhonchi or rales.  Chest:     Chest wall: No tenderness.  Abdominal:     General: Bowel sounds are normal.     Palpations: Abdomen is soft.  Musculoskeletal:        General: No swelling, deformity or signs of injury.     Cervical back: Tenderness present. Pain with movement and muscular tenderness present. Decreased range of motion.     Right lower leg: No edema.     Left lower leg: No edema.  Lymphadenopathy:     Cervical: No cervical adenopathy.  Skin:    General: Skin is warm and dry.     Capillary Refill: Capillary refill takes less than 2 seconds.     Coloration: Skin is not jaundiced or pale.     Findings: No bruising, erythema, lesion or rash.  Neurological:     General: No focal deficit present.     Mental Status: She is alert and oriented to person, place, and time. Mental status is at baseline.     Cranial Nerves: No cranial nerve deficit.     Sensory: No sensory deficit.     Motor: No weakness.     Coordination: Coordination normal.  Psychiatric:        Mood and Affect: Mood normal.        Behavior: Behavior normal.        Thought Content: Thought content normal.        Judgment: Judgment normal.      No results found for any visits on 09/26/21.  Assessment & Plan     Problem List Items Addressed This Visit       Other   Acute pain of right shoulder - Primary    Acute on chronic, stable Some tingling, denies pulse or temperature changes in hand/arm Will use NSAID and muscle relaxant to assist Referral placed to sports medicine for further assistance RTC in 1 month or sooner if needed       Relevant Medications   meloxicam (MOBIC) 15 MG tablet   methocarbamol (ROBAXIN) 750 MG tablet   Other Relevant Orders   Ambulatory referral to Sports Medicine     Return in about 4 weeks (around 10/24/2021).      Leilani Merl, FNP, have reviewed all documentation for this visit. The documentation on 09/26/21 for the exam, diagnosis, procedures, and orders are all accurate and complete.    Jacky Kindle, FNP  [facility] [phone] (phone) [phone] (fax)  [facility]

## 2021-09-26 NOTE — Assessment & Plan Note (Signed)
Acute on chronic, stable Some tingling, denies pulse or temperature changes in hand/arm Will use NSAID and muscle relaxant to assist Referral placed to sports medicine for further assistance RTC in 1 month or sooner if needed

## 2021-10-22 ENCOUNTER — Other Ambulatory Visit: Payer: Self-pay | Admitting: Family Medicine

## 2021-10-22 DIAGNOSIS — M25511 Pain in right shoulder: Secondary | ICD-10-CM

## 2021-10-23 ENCOUNTER — Other Ambulatory Visit: Payer: Self-pay | Admitting: Family Medicine

## 2021-10-23 DIAGNOSIS — M25511 Pain in right shoulder: Secondary | ICD-10-CM

## 2021-10-23 NOTE — Telephone Encounter (Signed)
Requested Prescriptions  Pending Prescriptions Disp Refills  . meloxicam (MOBIC) 15 MG tablet [Pharmacy Med Name: MELOXICAM 15 MG TABLET] 30 tablet 0    Sig: TAKE 1 TABLET (15 MG TOTAL) BY MOUTH DAILY. TAKE WITH MEAL.     Analgesics:  COX2 Inhibitors Failed - 10/23/2021  1:31 AM      Failed - Manual Review: Labs are only required if the patient has taken medication for more than 8 weeks.      Passed - HGB in normal range and within 360 days    Hemoglobin  Date Value Ref Range Status  09/10/2021 12.5 11.1 - 15.9 g/dL Final         Passed - Cr in normal range and within 360 days    Creatinine, Ser  Date Value Ref Range Status  09/10/2021 0.95 0.57 - 1.00 mg/dL Final         Passed - HCT in normal range and within 360 days    Hematocrit  Date Value Ref Range Status  09/10/2021 37.4 34.0 - 46.6 % Final         Passed - AST in normal range and within 360 days    AST  Date Value Ref Range Status  09/10/2021 21 0 - 40 IU/L Final         Passed - ALT in normal range and within 360 days    ALT  Date Value Ref Range Status  09/10/2021 19 0 - 32 IU/L Final         Passed - eGFR is 30 or above and within 360 days    GFR calc Af Amer  Date Value Ref Range Status  02/07/2020 105 >59 mL/min/1.73 Final    Comment:    **In accordance with recommendations from the NKF-ASN Task force,**   [facility] is in the process of updating its eGFR calculation to the   2021 CKD-EPI creatinine equation that estimates kidney function   without a race variable.    GFR, Estimated  Date Value Ref Range Status  12/11/2020 >60 >60 mL/min Final    Comment:    (NOTE) Calculated using the CKD-EPI Creatinine Equation (2021)    GFR  Date Value Ref Range Status  11/21/2015 85.37 >60.00 mL/min Final   eGFR  Date Value Ref Range Status  09/10/2021 73 >59 mL/min/1.73 Final         Passed - Patient is not pregnant      Passed - Valid encounter within last 12 months    Recent Outpatient Visits           3 weeks ago Acute pain of right shoulder   [facility] Tally Joe T, FNP   1 month ago Chronic HFrEF (heart failure with reduced ejection fraction) [facility])   [facility] Gwyneth Sprout, FNP   1 year ago Hypertension, unspecified type   [facility] Flinchum, Kelby Aline, FNP   1 year ago [place], Kelby Aline, FNP   2 years ago Low serum vitamin D   [place] Flinchum, Kelby Aline, FNP      Future Appointments            In 1 month Rollene Rotunda, Jaci Standard, [place], [place]

## 2021-10-23 NOTE — Telephone Encounter (Signed)
Requested medication (s) are due for refill today: Yes  Requested medication (s) are on the active medication list: Yes  Last refill:  09/26/21  Future visit scheduled: Yes  Notes to clinic:  See request.    Requested Prescriptions  Pending Prescriptions Disp Refills   methocarbamol (ROBAXIN) 750 MG tablet [Pharmacy Med Name: METHOCARBAMOL 750 MG TABLET] 60 tablet 0    Sig: Take 1 tablet (750 mg total) by mouth 2 (two) times daily as needed for muscle spasms.     Not Delegated - Analgesics:  Muscle Relaxants Failed - 10/22/2021 10:49 AM      Failed - This refill cannot be delegated      Passed - Valid encounter within last 6 months    Recent Outpatient Visits           3 weeks ago Acute pain of right shoulder   [facility] Merita Norton T, FNP   1 month ago Chronic HFrEF (heart failure with reduced ejection fraction) [facility])   [facility] Jacky Kindle, FNP   1 year ago Hypertension, unspecified type   [facility] Flinchum, Eula Fried, FNP   1 year ago Anxiety   [facility] Flinchum, Eula Fried, FNP   2 years ago Low serum vitamin D   [facility] Flinchum, Eula Fried, FNP       Future Appointments             In 1 month Suzie Portela, Daryl Eastern, FNP [facility]

## 2021-11-29 ENCOUNTER — Telehealth: Payer: Self-pay | Admitting: Nurse Practitioner

## 2021-11-29 NOTE — Telephone Encounter (Signed)
Pt c/o of Chest Pain: STAT if CP now or developed within 24 hours  1. Are you having CP right now? no  2. Are you experiencing any other symptoms (ex. SOB, nausea, vomiting, sweating)? No   3. How long have you been experiencing CP? 1 episode this morning with L side neck pain 30 minute duration  4. Is your CP continuous or coming and going? 1 episode   5. Have you taken Nitroglycerin? No took extra dose of asa 81 mg  ?

## 2021-11-29 NOTE — Telephone Encounter (Signed)
Pt c/o left side of neck pain 5/10 that began ~8 am this morning and also "chest discomfort" 4/10.  Pt takes daily ASA 81 mg and took her regular dose at 8 am then took a second ASA 81 mg tablet 30 min later d/t chest/neck pain. Pt states after second ASA her symptoms have completely resolved.   Pt denies shortness of breath. No current symptoms at this time.  Pt s/p NSTEMI 11/2020.   Pt does not have BP cuff at home and unable to report vitals.  Pt states she feels stable at this time.  Follow up appointment scheduled to see Frutoso Schatz, PA-C next week 12/05/21.   Advised pt to monitor symptoms and ER precautions provided.  Pt voiced understanding and has no further questions or concerns at this time.

## 2021-12-04 NOTE — Progress Notes (Unsigned)
Cardiology Clinic Note   Patient Name: Isabella Burch Date of Encounter: 12/05/2021  Primary Care Provider:  Jacky Kindle, FNP Primary Cardiologist:  Lorine Bears, MD  Patient Profile    51 year old female with a history of hypertension, hyperlipidemia, anxiety, rheumatoid arthritis, tobacco use, coronary artery disease for NSTEMI and finding of Takotsubo cardiomyopathy, who is here today to follow-up on her CAD.  Past Medical History    Past Medical History:  Diagnosis Date   Anxiety    Ascending aorta dilatation (HCC)    a. 11/2020 CTA chest: 3.8cm asc Ao.   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 11/2020 Echo: EF 20-25%; b. 02/2021 Echo: EF 55-60%, no rwma.   Extensor tendon rupture, non-traumatic, hand and wrist    right   Heart murmur    History of kidney stones    Hypertension    Nonobstructive CAD (coronary artery disease)    a. 11/2020 NSTEMI/Cath: LM nl, LAD 57m, D1/2 nl, LCX min irregs, RCA nl, RPDA/RPAV nl.   Rheumatoid arthritis (HCC) dx 2006   BILATERAL HANDS AND RIGHT ANKLE   Takotsubo cardiomyopathy    a. 11/2020 Echo: EF 20-25%, mid-apical ant, inf, and apical AK. Nl RV size/fxn. Mild MR; b. 11/2020 Cath: nonobs dzs; c. 02/2021 Echo: EF 55-60%, no rwma, nl RV fxn, Mild MR, Asc Ao 20mm.   Tobacco abuse    Past Surgical History:  Procedure Laterality Date   CARDIAC VALVE REPLACEMENT     CYSTOSCOPY WITH URETEROSCOPY, STONE BASKETRY AND STENT PLACEMENT  2011   LEFT HEART CATH AND CORONARY ANGIOGRAPHY N/A 12/10/2020   Procedure: LEFT HEART CATH AND CORONARY ANGIOGRAPHY;  Surgeon: Iran Ouch, MD;  Location: [facility] INVASIVE CV LAB;  Service: Cardiovascular;  Laterality: N/A;   LEFT WRIST EXTENSOR TENDON REPAIR     TENDON TRANSFER Right 11/09/2014   Procedure: RIGHT THUMB EIP TO EPL TENDON TRANSFER;  Surgeon: Bradly Bienenstock, MD;  Location: [facility] [place];  Service: Orthopedics;  Laterality: Right;   TUBAL LIGATION       Allergies  Allergies  Allergen Reactions   Enbrel [Etanercept] Swelling   Humira [Adalimumab] Other (See Comments)    kidney dysfunction    History of Present Illness    51 year old female with the above past medical history including hypertension, hyperlipidemia, anxiety, rheumatoid arthritis, tobacco abuse, coronary artery disease with NSTEMI and Takotsubo cardiomyopathy.  On August 2022, while she was at church she had sudden onset of substernal chest heaviness with radiation into her jaw, associated with diaphoresis, nausea without vomiting, and dyspnea.  She was taken via EMS to the [facility] ED where EKG showed sinus rhythm with left atrial enlargement, prior anteroseptal infarct, and 1 mm ST segment elevation in V1 and V2 with more subtle ST elevation in V3 through V6.  CT angio of the chest was negative for PE, though she was instantly noted to have mild dilatation of the ascending aorta at 3.8 cm.  Troponin was elevated at 30-84.  Echocardiogram showed an EF of 20 to 25% with mild apical anterior, inferior, and apical hypokinesis consistent with stress-induced cardiomyopathy.  Diagnostic catheterization showed minimal nonobstructive CAD, and medical therapy was recommended.  She did require diuresis.  Medical therapy was limited by soft blood pressures.  Repeat echocardiogram done on 02/2021 revealed an LVEF of 55 to 60%,  no regional wall motion abnormalities, and mild mitral regurgitation.  She was last seen in clinic  in 01/2021.  Since that time she has had 1 emergency department visit where she was diagnosed with right cervical radiculopathy.  She had medical management and was encouraged to follow-up with rheumatology.  She returns to clinic today stating that she has been doing fairly well.  She did have the exception of 1 episode where she was at work cleaning out coolers and had chest pressure and chest discomfort that radiated up the left side of her neck.  She said it lasted for  approximately 20 minutes and she took an extra aspirin and shortly thereafter the symptoms resolved.  She denied any associated symptoms shortness of breath, nausea, vomiting or palpitations.  She has not had any episodes since that time but is rather nervous about it concerned as it has been a year since her Takotsubo event.  Home Medications    Current Outpatient Medications  Medication Sig Dispense Refill   acetaminophen (TYLENOL) 500 MG tablet Take 500 mg by mouth every 6 (six) hours as needed for moderate pain, mild pain, headache or fever.     aspirin EC 81 MG EC tablet Take 1 tablet (81 mg total) by mouth daily. Swallow whole. 30 tablet 11   atorvastatin (LIPITOR) 40 MG tablet Take 1 tablet (40 mg total) by mouth daily. 90 tablet 3   DULoxetine (CYMBALTA) 60 MG capsule Take 1 capsule (60 mg total) by mouth daily. 90 capsule 3   fluticasone (FLONASE) 50 MCG/ACT nasal spray SPRAY 2 SPRAYS INTO EACH NOSTRIL EVERY DAY 16 mL 1   leflunomide (ARAVA) 10 MG tablet TAKE 1 TABLET BY MOUTH ONCE A DAY     lisinopril (ZESTRIL) 2.5 MG tablet Take 1 tablet (2.5 mg total) by mouth daily. 90 tablet 3   meclizine (ANTIVERT) 25 MG tablet Take 1 tablet (25 mg total) by mouth 3 (three) times daily as needed for dizziness. 30 tablet 0   meloxicam (MOBIC) 15 MG tablet TAKE 1 TABLET (15 MG TOTAL) BY MOUTH DAILY. TAKE WITH MEAL. 30 tablet 0   methocarbamol (ROBAXIN) 750 MG tablet TAKE 1 TABLET (750 MG TOTAL) BY MOUTH 2 (TWO) TIMES DAILY AS NEEDED FOR MUSCLE SPASMS. 60 tablet 0   metoprolol succinate (TOPROL-XL) 25 MG 24 hr tablet Take 0.5 tablets (12.5 mg total) by mouth daily. 45 tablet 3   ondansetron (ZOFRAN ODT) 4 MG disintegrating tablet Take 1 tablet (4 mg total) by mouth every 8 (eight) hours as needed for nausea or vomiting. 20 tablet 0   VITAMIN D PO Take by mouth. 4000IU     ORENCIA CLICKJECT 125 MG/ML SOAJ Inject 1 Syringe into the skin once a week. (Patient not taking: Reported on 12/05/2021)     No  current facility-administered medications for this visit.     Family History    Family History  Problem Relation Age of Onset   Hypothyroidism Mother    Coronary artery disease Mother    Hyperlipidemia Father    Hypertension Father    Diabetes Father    She indicated that her mother is alive. She indicated that her father is deceased. She indicated that both of her sisters are alive. She indicated that her daughter is alive. She indicated that her son is alive.  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 She lives at home with her husband, two children and a granddaughter. They have 2 dogs (great pyreneees and dachbull).  She works at the [employer] in [employer]. She enjoys working and being around children.    Social Determinants of Health   Financial Resource Strain: Not on file  Food Insecurity: Not on file  Transportation Needs: Not on file  Physical Activity: Not on file  Stress: Not on file  Social Connections: Not on file  Intimate Partner Violence: Not on file     Review of Systems    General:  No chills, fever, night sweats or weight changes.  Cardiovascular: Endorses chest  pain, dyspnea on exertion, edema, orthopnea, palpitations, paroxysmal nocturnal dyspnea. Dermatological: No rash, lesions/masses Respiratory: No cough, dyspnea Urologic: No hematuria, dysuria Abdominal:   No nausea, vomiting, diarrhea, bright red blood per rectum, melena, or hematemesis Neurologic:  No visual changes, wkns, changes in mental status. All other systems reviewed and are otherwise negative except as noted above.     Physical Exam    VS:  BP 138/88 (BP Location: Left Arm, Patient Position: Sitting, Cuff Size: Normal)   Pulse 66   Ht 5\' 6"  (1.676 m)   Wt 147 lb 3.2 oz (66.8 kg)   SpO2 95%   BMI 23.76 kg/m  , BMI Body mass index is 23.76 kg/m.     GEN: Well nourished, well developed, in no acute distress. HEENT: normal. Neck: Supple, no JVD, carotid bruits, or masses. Cardiac: RRR, no murmurs, rubs, or gallops. No clubbing, cyanosis, edema.  Radials/DP/PT 2+ and equal bilaterally.  Respiratory:  Respirations regular and unlabored, clear to auscultation bilaterally. GI: Soft, nontender, nondistended, BS + x 4. MS: no deformity or atrophy. Skin: warm and dry, no rash. Neuro:  Strength and sensation are intact. Psych: Normal affect.  Accessory Clinical Findings    ECG personally reviewed by me today-sinus rhythm with a rate of 66, left atrial enlargement- No acute changes  Lab Results  Component Value Date   WBC 5.5 09/10/2021   HGB 12.5 09/10/2021   HCT 37.4 09/10/2021   MCV 97 09/10/2021   PLT 359 09/10/2021   Lab Results  Component Value Date   CREATININE 0.95 09/10/2021   BUN 20 09/10/2021   NA 143 09/10/2021   K 4.0 09/10/2021   CL 104 09/10/2021   CO2 26 09/10/2021   Lab Results  Component Value Date   ALT 19 09/10/2021   AST 21 09/10/2021   ALKPHOS 107 09/10/2021   BILITOT 0.2 09/10/2021   Lab Results  Component Value Date   CHOL 135 09/10/2021   HDL 57 09/10/2021   LDLCALC 62 09/10/2021   TRIG 83 09/10/2021   CHOLHDL 2.4 09/10/2021     Lab Results  Component Value  Date   HGBA1C 5.7 (H) 12/10/2020    Assessment & Plan   1.  Precordial chest pain that lasted approximately 20 minutes while she was at work and resolved with aspirin.  She denied any associated symptoms.  She did note fatigue after the episode.  EKG today revealed sinus rhythm with a left atrial enlargement.  She will be scheduled for Lexiscan stress testing for chest pain.  2.  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  Diagnostic heart catheterization revealed nonobstructive LAD and minimal circumflex disease, she been medically managed for stress-induced cardiomyopathy.Limited echocardiogram revealed normalized EF of 55 to 60%, mild MR, and the aortic size is on the high end of normal.  3.  Nonobstructive coronary artery disease status post non-STEMI in the setting of stress-induced cardiomyopathy in August 2022.  Catheterization with minimal LAD disease.  She has only had 1 recent episode of chest discomfort as mentioned above in #1.  She has been continued on aspirin, statin, ACE, and beta-blocker therapy.  4.  Essential hypertension with a blood pressure of 138/88.  Mildly elevated today as she was rushing here from work but overall her blood pressure has maintained being well controlled.  She has noted weight gain as well.  We will continue her current regimen for now.  5.  Hyperlipidemia with an LDL of 62 on 09/10/2021.  She has tolerated her atorvastatin 40 mg daily well.  She is continue to make diet modifications.  No changes were made to her medication regimen today  6.  Dilated ascending aorta of the CT of the chest that showed about 4 cm ascending aorta with an addendum showing 3.8 cm.  On return she will need follow-up CT angio of the chest for  surveillance follow-up and repeat testing yearly as follow-up.  7.  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  8.  Disposition she is return to clinic after stress testing is completed to see MD/APP  Loyde Orth, NP 12/05/2021, 4:01 PM

## 2021-12-05 ENCOUNTER — Encounter: Payer: Self-pay | Admitting: Cardiology

## 2021-12-05 ENCOUNTER — Ambulatory Visit: Payer: BC Managed Care – PPO | Admitting: Cardiology

## 2021-12-05 VITALS — BP 138/88 | HR 66 | Ht 66.0 in | Wt 147.2 lb

## 2021-12-05 DIAGNOSIS — E782 Mixed hyperlipidemia: Secondary | ICD-10-CM | POA: Diagnosis not present

## 2021-12-05 DIAGNOSIS — R072 Precordial pain: Secondary | ICD-10-CM | POA: Diagnosis not present

## 2021-12-05 DIAGNOSIS — I5181 Takotsubo syndrome: Secondary | ICD-10-CM

## 2021-12-05 DIAGNOSIS — I77819 Aortic ectasia, unspecified site: Secondary | ICD-10-CM

## 2021-12-05 DIAGNOSIS — I5022 Chronic systolic (congestive) heart failure: Secondary | ICD-10-CM | POA: Diagnosis not present

## 2021-12-05 DIAGNOSIS — I2510 Atherosclerotic heart disease of native coronary artery without angina pectoris: Secondary | ICD-10-CM | POA: Diagnosis not present

## 2021-12-05 DIAGNOSIS — I428 Other cardiomyopathies: Secondary | ICD-10-CM

## 2021-12-05 DIAGNOSIS — I10 Essential (primary) hypertension: Secondary | ICD-10-CM

## 2021-12-05 NOTE — Patient Instructions (Signed)
Medication Instructions:   Your physician recommends that you continue on your current medications as directed. Please refer to the Current Medication list given to you today.   *If you need a refill on your cardiac medications before your next appointment, please call your pharmacy*   Lab Work: None ordered  If you have labs (blood work) drawn today and your tests are completely normal, you will receive your results only by: MyChart Message (if you have MyChart) OR A paper copy in the mail If you have any lab test that is abnormal or we need to change your treatment, we will call you to review the results.   Testing/Procedures: [facility] MYOVIEW  Your caregiver has ordered a Stress Test with nuclear imaging. The purpose of this test is to evaluate the blood supply to your heart muscle. This procedure is referred to as a "Non-Invasive Stress Test." This is because other than having an IV started in your vein, nothing is inserted or "invades" your body. Cardiac stress tests are done to find areas of poor blood flow to the heart by determining the extent of coronary artery disease (CAD). Some patients exercise on a treadmill, which naturally increases the blood flow to your heart, while others who are  unable to walk on a treadmill due to physical limitations have a pharmacologic/chemical stress agent called Lexiscan . This medicine will mimic walking on a treadmill by temporarily increasing your coronary blood flow.   Please note: these test may take anywhere between 2-4 hours to complete  PLEASE REPORT TO [facility] MEDICAL MALL ENTRANCE  THE VOLUNTEERS AT THE FIRST DESK WILL DIRECT YOU WHERE TO GO  Date of Procedure:_____________________________________  Arrival Time for Procedure:______________________________   PLEASE NOTIFY THE OFFICE AT LEAST 24 HOURS IN ADVANCE IF YOU ARE UNABLE TO KEEP YOUR APPOINTMENT.  [phone] AND  PLEASE NOTIFY NUCLEAR MEDICINE AT [facility] AT LEAST 24 HOURS IN  ADVANCE IF YOU ARE UNABLE TO KEEP YOUR APPOINTMENT. [phone]  How to prepare for your Myoview test:  Do not eat or drink after midnight No caffeine for 24 hours prior to test No smoking 24 hours prior to test. Your medication may be taken with water.  If your doctor stopped a medication because of this test, do not take that medication. Ladies, please do not wear dresses.  Skirts or pants are appropriate. Please wear a short sleeve shirt. No perfume, cologne or lotion. Wear comfortable walking shoes. No heels!   Follow-Up: At [facility], you and your health needs are our priority.  As part of our continuing mission to provide you with exceptional heart care, we have created designated Provider Care Teams.  These Care Teams include your primary Cardiologist (physician) and Advanced Practice Providers (APPs -  Physician Assistants and Nurse Practitioners) who all work together to provide you with the care you need, when you need it.  We recommend signing up for the patient portal called "MyChart".  Sign up information is provided on this After Visit Summary.  MyChart is used to connect with patients for Virtual Visits (Telemedicine).  Patients are able to view lab/test results, encounter notes, upcoming appointments, etc.  Non-urgent messages can be sent to your provider as well.   To learn more about what you can do with MyChart, go to [email or web address].    Your next appointment:   4-6 week(s)  The format for your next appointment:   In Person  Provider:   You may see Lorine Bears, MD or one of  the following Advanced Practice Providers on your designated Care Team:   Nicolasa Ducking, NP Eula Listen, PA-C Cadence Fransico Michael, PA-C Charlsie Quest, NP    Important Information About Sugar

## 2021-12-05 NOTE — Progress Notes (Deleted)
Complete physical exam   Patient: Isabella Burch   DOB: 25-Aug-1970   51 y.o. Female  MRN: [id number] Visit Date: 12/09/2021  Today's healthcare provider: Jacky Kindle, FNP   No chief complaint on file.  Subjective    Isabella Burch is a 51 y.o. female who presents today for a complete physical exam.  She reports consuming a {diet types:17450} diet. {Exercise:19826} She generally feels {well/fairly well/poorly:18703}. She reports sleeping {well/fairly well/poorly:18703}. She {does/does not:200015} have additional problems to discuss today.  HPI  ***  Past Medical History:  Diagnosis Date   Anxiety    Ascending aorta dilatation (HCC)    a. 11/2020 CTA chest: 3.8cm asc Ao.   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 11/2020 Echo: EF 20-25%; b. 02/2021 Echo: EF 55-60%, no rwma.   Extensor tendon rupture, non-traumatic, hand and wrist    right   Heart murmur    History of kidney stones    Hypertension    Nonobstructive CAD (coronary artery disease)    a. 11/2020 NSTEMI/Cath: LM nl, LAD 48m, D1/2 nl, LCX min irregs, RCA nl, RPDA/RPAV nl.   Rheumatoid arthritis (HCC) dx 2006   BILATERAL HANDS AND RIGHT ANKLE   Takotsubo cardiomyopathy    a. 11/2020 Echo: EF 20-25%, mid-apical ant, inf, and apical AK. Nl RV size/fxn. Mild MR; b. 11/2020 Cath: nonobs dzs; c. 02/2021 Echo: EF 55-60%, no rwma, nl RV fxn, Mild MR, Asc Ao 34mm.   Tobacco abuse    Past Surgical History:  Procedure Laterality Date   CARDIAC VALVE REPLACEMENT     CYSTOSCOPY WITH URETEROSCOPY, STONE BASKETRY AND STENT PLACEMENT  2011   LEFT HEART CATH AND CORONARY ANGIOGRAPHY N/A 12/10/2020   Procedure: LEFT HEART CATH AND CORONARY ANGIOGRAPHY;  Surgeon: Isabella Ouch, MD;  Location: [facility] INVASIVE CV [facility];  Service: Cardiovascular;  Laterality: N/A;   LEFT WRIST EXTENSOR TENDON REPAIR     TENDON TRANSFER Right 11/09/2014   Procedure: RIGHT THUMB EIP TO EPL TENDON TRANSFER;  Surgeon: Isabella Bienenstock, MD;  Location: [facility];  Service: Orthopedics;  Laterality: Right;   TUBAL LIGATION     Social History   Socioeconomic History   Marital status: Married    Spouse name: Isabella Burch   Number of children: 2   Years of education: 12   Highest education level: Not on file  Occupational History   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    Employer: [employer]    Comment: Engineer, maintenance (IT)   Occupation: Estate agent    Comment: Part-time job  Tobacco Use   Smoking status: Every Day    Packs/day: 0.50    Years: 25.00    Total pack years: 12.50    Types: Cigarettes   Smokeless tobacco: Never   Tobacco comments:    0.5 ppd for much of her life but currently smoking 2-4 cigarettes/day (11/2020)  Substance and Sexual Activity   Alcohol use: No    Alcohol/week: 0.0 standard drinks of alcohol   Drug use: No   Sexual activity: Not on file  Other Topics Concern   Not on file  Social History Narrative   Isabella Burch grew up in [place]. She lives at home with her husband, two children and a granddaughter. They have 2 dogs (great pyreneees and dachbull).  She works at the [employer] in [employer]. She enjoys working and being around children.  Social Determinants of Health   Financial Resource Strain: Not on file  Food Insecurity: Not on file  Transportation Needs: Not on file  Physical Activity: Not on file  Stress: Not on file  Social Connections: Not on file  Intimate Partner Violence: Not on file   Family Status  Relation Name Status   Mother  Alive   Father  Deceased   Sister  Alive   Sister  Alive   Daughter  Alive   Son  Alive   Family History  Problem Relation Age of Onset   Hypothyroidism Mother    Coronary artery disease Mother    Hyperlipidemia Father    Hypertension Father    Diabetes Father    Allergies  Allergen Reactions   Enbrel [Etanercept] Swelling   Humira [Adalimumab] Other (See Comments)     kidney dysfunction    Patient Care Team: Isabella Kindle, FNP as PCP - General (Family Medicine) Isabella Ouch, MD as PCP - Cardiology (Cardiology)   Medications: Outpatient Medications Prior to Visit  Medication Sig   acetaminophen (TYLENOL) 500 MG tablet Take 500 mg by mouth every 6 (six) hours as needed for moderate pain, mild pain, headache or fever.   aspirin EC 81 MG EC tablet Take 1 tablet (81 mg total) by mouth daily. Swallow whole.   atorvastatin (LIPITOR) 40 MG tablet Take 1 tablet (40 mg total) by mouth daily.   DULoxetine (CYMBALTA) 60 MG capsule Take 1 capsule (60 mg total) by mouth daily.   fluticasone (FLONASE) 50 MCG/ACT nasal spray SPRAY 2 SPRAYS INTO EACH NOSTRIL EVERY DAY   leflunomide (ARAVA) 10 MG tablet TAKE 1 TABLET BY MOUTH ONCE A DAY   lisinopril (ZESTRIL) 2.5 MG tablet Take 1 tablet (2.5 mg total) by mouth daily.   meclizine (ANTIVERT) 25 MG tablet Take 1 tablet (25 mg total) by mouth 3 (three) times daily as needed for dizziness.   meloxicam (MOBIC) 15 MG tablet TAKE 1 TABLET (15 MG TOTAL) BY MOUTH DAILY. TAKE WITH MEAL.   methocarbamol (ROBAXIN) 750 MG tablet TAKE 1 TABLET (750 MG TOTAL) BY MOUTH 2 (TWO) TIMES DAILY AS NEEDED FOR MUSCLE SPASMS.   metoprolol succinate (TOPROL-XL) 25 MG 24 hr tablet Take 0.5 tablets (12.5 mg total) by mouth daily.   ondansetron (ZOFRAN ODT) 4 MG disintegrating tablet Take 1 tablet (4 mg total) by mouth every 8 (eight) hours as needed for nausea or vomiting.   ORENCIA CLICKJECT 125 MG/ML SOAJ Inject 1 Syringe into the skin once a week.   VITAMIN D PO Take by mouth. 4000IU   No facility-administered medications prior to visit.    Review of Systems  {Labs  Heme  Chem  Endocrine  Serology  Results Review (optional):23779}  Objective    There were no vitals taken for this visit. {Show previous vital signs (optional):23777}   Physical Exam  ***  Last depression screening scores    09/10/2021    2:13 PM 05/01/2021    10:40 AM 05/01/2021   10:27 AM  PHQ 2/9 Scores  PHQ - 2 Score 1 4 0  PHQ- 9 Score 4 20    Last fall risk screening    09/10/2021    2:13 PM  Fall Risk   Falls in the past year? 0  Number falls in past yr: 0  Injury with Fall? 0   Last Audit-C alcohol use screening    09/10/2021    2:14 PM  Alcohol Use Disorder Test (AUDIT)  1. How often do you have a drink containing alcohol? 1  2. How many drinks containing alcohol do you have on a typical day when you are drinking? 0  3. How often do you have six or more drinks on one occasion? 0  AUDIT-C Score 1   A score of 3 or more in women, and 4 or more in men indicates increased risk for alcohol abuse, EXCEPT if all of the points are from question 1   No results found for any visits on 12/09/21.  Assessment & Plan    Routine Health Maintenance and Physical Exam  Exercise Activities and Dietary recommendations  Goals      Quit Smoking        Immunization History  Administered Date(s) Administered   Tdap 09/10/2021    Health Maintenance  Topic Date Due   COVID-19 Vaccine (1) Never done   COLONOSCOPY (Pts 45-27yrs Insurance coverage will need to be confirmed)  Never done   INFLUENZA VACCINE  11/19/2021   PAP SMEAR-Modifier  05/09/2022   TETANUS/TDAP  09/11/2031   Hepatitis C Screening  Completed   HIV Screening  Completed   HPV VACCINES  Aged Out    Discussed health benefits of physical activity, and encouraged her to engage in regular exercise appropriate for her age and condition.  ***  No follow-ups on file.     {provider attestation***:1}   Isabella Kindle, FNP  [facility] [phone] (phone) [phone] (fax)  [facility]

## 2021-12-09 ENCOUNTER — Encounter: Payer: Managed Care, Other (non HMO) | Admitting: Family Medicine

## 2021-12-12 ENCOUNTER — Encounter: Payer: BC Managed Care – PPO | Attending: Cardiology

## 2021-12-31 ENCOUNTER — Other Ambulatory Visit: Payer: Self-pay | Admitting: Family Medicine

## 2021-12-31 DIAGNOSIS — M25511 Pain in right shoulder: Secondary | ICD-10-CM

## 2022-01-06 ENCOUNTER — Encounter: Payer: Self-pay | Admitting: Emergency Medicine

## 2022-01-06 ENCOUNTER — Ambulatory Visit
Admission: EM | Admit: 2022-01-06 | Discharge: 2022-01-06 | Disposition: A | Discharge status: Home / Self Care | Payer: BC Managed Care – PPO | Attending: Emergency Medicine | Admitting: Emergency Medicine

## 2022-01-06 ENCOUNTER — Other Ambulatory Visit: Payer: Self-pay

## 2022-01-06 DIAGNOSIS — U071 COVID-19: Secondary | ICD-10-CM

## 2022-01-06 MED ORDER — PAXLOVID (150/100) 10 X 150 MG & 10 X 100MG PO TBPK: 3.0000 | ORAL_TABLET | Freq: Two times a day (BID) | ORAL | 0 refills | Status: AC

## 2022-01-06 MED ORDER — PROMETHAZINE-DM 6.25-15 MG/5ML PO SYRP: 5.0000 mL | ORAL_SOLUTION | Freq: Four times a day (QID) | ORAL | 0 refills | Status: DC | PRN

## 2022-01-06 NOTE — ED Provider Notes (Signed)
[facility]    CSN: [id number] Arrival date & time: 01/06/22  4098    HISTORY   Chief Complaint  Patient presents with   Cough   covid positive   HPI Isabella Burch is a pleasant, 51 y.o. female who presents to urgent care today. Patient complains of cough, congestion, body aches and overall not feeling well.  Patient states her symptoms began on September 14 which was 4 days ago.  Patient states from COVID-19 test 2 days ago which was positive.  Patient states has been taking Tylenol for symptoms.  Patient has a history of Takotsubo cardiomyopathy, myocardial infarction and rheumatoid arthritis.  Patient states her employer is requiring her to provide proof of her having COVID-19 because according to her employer a home test is not sufficient.  The history is provided by the patient.   Past Medical History:  Diagnosis Date   Anxiety    Ascending aorta dilatation (HCC)    a. 11/2020 CTA chest: 3.8cm asc Ao.   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 11/2020 Echo: EF 20-25%; b. 02/2021 Echo: EF 55-60%, no rwma.   Extensor tendon rupture, non-traumatic, hand and wrist    right   Heart murmur    History of kidney stones    Hypertension    Nonobstructive CAD (coronary artery disease)    a. 11/2020 NSTEMI/Cath: LM nl, LAD 24m, D1/2 nl, LCX min irregs, RCA nl, RPDA/RPAV nl.   Rheumatoid arthritis (HCC) dx 2006   BILATERAL HANDS AND RIGHT ANKLE   Takotsubo cardiomyopathy    a. 11/2020 Echo: EF 20-25%, mid-apical ant, inf, and apical AK. Nl RV size/fxn. Mild MR; b. 11/2020 Cath: nonobs dzs; c. 02/2021 Echo: EF 55-60%, no rwma, nl RV fxn, Mild MR, Asc Ao 36mm.   Tobacco abuse    Patient Active Problem List   Diagnosis Date Noted   Acute pain of right shoulder 09/26/2021   Chronic HFrEF (heart failure with reduced ejection fraction) (HCC) 09/10/2021   Depression, recurrent (HCC) 09/10/2021   Encounter for screening for HIV 09/10/2021    Encounter for hepatitis C virus screening test for high risk patient 09/10/2021   Dyslipidemia, goal LDL below 70 09/10/2021   Screening-pulmonary TB 09/10/2021   Need for Tdap vaccination 09/10/2021   Depression 12/11/2020   Stress-induced cardiomyopathy 12/11/2020   HLD (hyperlipidemia) 12/10/2020   Tobacco use 12/10/2020   Ascending aorta dilatation (HCC) 12/09/2020   NSTEMI (non-ST elevated myocardial infarction) (HCC) 12/09/2020   Shortness of breath- occasional 05/15/2020   Vitamin D deficiency 02/07/2020   Anxiety 06/14/2019   Acute otitis externa of left ear 06/14/2019   Eustachian tube dysfunction, bilateral 06/14/2019   Low serum vitamin D 06/14/2019   Tobacco use disorder, continuous 06/14/2019   Rheumatoid arthritis (HCC) 11/22/2015   Chest pain 11/22/2015   Panic attacks 12/04/2014   Hypertension 10/08/2014   Screening mammogram for breast cancer 02/25/2013   Past Surgical History:  Procedure Laterality Date   CARDIAC VALVE REPLACEMENT     CYSTOSCOPY WITH URETEROSCOPY, STONE BASKETRY AND STENT PLACEMENT  2011   LEFT HEART CATH AND CORONARY ANGIOGRAPHY N/A 12/10/2020   Procedure: LEFT HEART CATH AND CORONARY ANGIOGRAPHY;  Surgeon: Iran Ouch, MD;  Location: [facility]VASIVE [facility];  Service: Cardiovascular;  Laterality: N/A;   LEFT WRIST EXTENSOR TENDON REPAIR     TENDON TRANSFER Right 11/09/2014   Procedure: RIGHT THUMB EIP TO EPL TENDON TRANSFER;  Surgeon: Merlyn Albert  Caralyn Guile, MD;  Location: [facility];  Service: Orthopedics;  Laterality: Right;   TUBAL LIGATION     OB History     Gravida  2   Para      Term      Preterm      AB      Living  2      SAB      IAB      Ectopic      Multiple      Live Births             Home Medications    Prior to Admission medications   Medication Sig Start Date End Date Taking? Authorizing Provider  nirmatrelvir & ritonavir (PAXLOVID, 150/100,) 10 x 150 MG & 10 x 100MG  TBPK Take 3 tablets by  mouth 2 (two) times daily for 5 days. 01/06/22 01/11/22 Yes Lynden [place] Scales, PA-C  promethazine-dextromethorphan (PROMETHAZINE-DM) 6.25-15 MG/5ML syrup Take 5 mLs by mouth 4 (four) times daily as needed for cough. 01/06/22  Yes Lynden [place] Scales, PA-C  acetaminophen (TYLENOL) 500 MG tablet Take 500 mg by mouth every 6 (six) hours as needed for moderate pain, mild pain, headache or fever.    [provider]  aspirin EC 81 MG EC tablet Take 1 tablet (81 mg total) by mouth daily. Swallow whole. 12/12/20   Dwyane Dee, MD  atorvastatin (LIPITOR) 40 MG tablet Take 1 tablet (40 mg total) by mouth daily. 03/28/21 12/05/21  Theora Gianotti, NP  DULoxetine (CYMBALTA) 60 MG capsule Take 1 capsule (60 mg total) by mouth daily. 09/10/21   Gwyneth Sprout, FNP  fluticasone (FLONASE) 50 MCG/ACT nasal spray SPRAY 2 SPRAYS INTO EACH NOSTRIL EVERY DAY Patient not taking: Reported on 01/06/2022 05/01/21   Flinchum, Kelby Aline, FNP  leflunomide (ARAVA) 10 MG tablet TAKE 1 TABLET BY MOUTH ONCE A DAY 07/17/14   [provider]  lisinopril (ZESTRIL) 2.5 MG tablet Take 1 tablet (2.5 mg total) by mouth daily. 05/01/21 04/26/22  Flinchum, Kelby Aline, FNP  meclizine (ANTIVERT) 25 MG tablet Take 1 tablet (25 mg total) by mouth 3 (three) times daily as needed for dizziness. 04/27/20   Vanessa [place], MD  meloxicam (MOBIC) 15 MG tablet TAKE 1 TABLET (15 MG TOTAL) BY MOUTH DAILY. TAKE WITH MEAL. 10/23/21   Gwyneth Sprout, FNP  methocarbamol (ROBAXIN) 750 MG tablet TAKE 1 TABLET (750 MG TOTAL) BY MOUTH 2 (TWO) TIMES DAILY AS NEEDED FOR MUSCLE SPASMS. 01/01/22   Gwyneth Sprout, FNP  metoprolol succinate (TOPROL-XL) 25 MG 24 hr tablet Take 0.5 tablets (12.5 mg total) by mouth daily. 09/10/21   Gwyneth Sprout, FNP  ondansetron (ZOFRAN ODT) 4 MG disintegrating tablet Take 1 tablet (4 mg total) by mouth every 8 (eight) hours as needed for nausea or vomiting. 04/27/20   Vanessa [place], MD  ORENCIA CLICKJECT 485 MG/ML  SOAJ Inject 1 Syringe into the skin once a week. Patient not taking: Reported on 12/05/2021 05/30/19   [provider]  VITAMIN D PO Take by mouth. 4000IU    [provider]    Family History Family History  Problem Relation Age of Onset   Hypothyroidism Mother    Coronary artery disease Mother    Hyperlipidemia Father    Hypertension Father    Diabetes Father    Social History Social History   Tobacco Use   Smoking status: Some Days    Types: E-cigarettes  Smokeless tobacco: Never   Tobacco comments:    0.5 ppd for much of her life but currently smoking 2-4 cigarettes/day (11/2020)  Vaping Use   Vaping Use: Never used  Substance Use Topics   Alcohol use: No    Alcohol/week: 0.0 standard drinks of alcohol   Drug use: No   Allergies   Enbrel [etanercept] and Humira [adalimumab]  Review of Systems Review of Systems Pertinent findings revealed after performing a 14 point review of systems has been noted in the history of present illness.  Physical Exam Triage Vital Signs ED Triage Vitals  Enc Vitals Group     BP 02/15/21 0827 (!) 147/82     Pulse Rate 02/15/21 0827 72     Resp 02/15/21 0827 18     Temp 02/15/21 0827 98.3 F (36.8 C)     Temp Source 02/15/21 0827 Oral     SpO2 02/15/21 0827 98 %     Weight --      Height --      Head Circumference --      Peak Flow --      Pain Score 02/15/21 0826 5     Pain Loc --      Pain Edu? --      Excl. in GC? --   No data found.  Updated Vital Signs BP (!) 153/91 (BP Location: Right Arm)   Pulse 69   Temp 98.2 F (36.8 C) (Oral)   Resp 16   SpO2 97%   Physical Exam Vitals and nursing note reviewed.  Constitutional:      General: She is not in acute distress.    Appearance: Normal appearance. She is not ill-appearing.  HENT:     Head: Normocephalic and atraumatic.     Salivary Glands: Right salivary gland is not diffusely enlarged or tender. Left salivary gland is not diffusely enlarged or  tender.     Right Ear: Tympanic membrane, ear canal and external ear normal. No drainage. No middle ear effusion. There is no impacted cerumen. Tympanic membrane is not erythematous or bulging.     Left Ear: Tympanic membrane, ear canal and external ear normal. No drainage.  No middle ear effusion. There is no impacted cerumen. Tympanic membrane is not erythematous or bulging.     Nose: Nose normal. No nasal deformity, septal deviation, mucosal edema, congestion or rhinorrhea.     Right Turbinates: Not enlarged, swollen or pale.     Left Turbinates: Not enlarged, swollen or pale.     Right Sinus: No maxillary sinus tenderness or frontal sinus tenderness.     Left Sinus: No maxillary sinus tenderness or frontal sinus tenderness.     Mouth/Throat:     Lips: Pink. No lesions.     Mouth: Mucous membranes are moist. No oral lesions.     Pharynx: Oropharynx is clear. Uvula midline. No posterior oropharyngeal erythema or uvula swelling.     Tonsils: No tonsillar exudate. 0 on the right. 0 on the left.  Eyes:     General: Lids are normal.        Right eye: No discharge.        Left eye: No discharge.     Extraocular Movements: Extraocular movements intact.     Conjunctiva/sclera: Conjunctivae normal.     Right eye: Right conjunctiva is not injected.     Left eye: Left conjunctiva is not injected.  Neck:     Trachea: Trachea and phonation normal.  Cardiovascular:  Rate and Rhythm: Normal rate and regular rhythm.     Pulses: Normal pulses.     Heart sounds: Normal heart sounds. No murmur heard.    No friction rub. No gallop.  Pulmonary:     Effort: Pulmonary effort is normal. No accessory muscle usage, prolonged expiration or respiratory distress.     Breath sounds: Normal breath sounds. No stridor, decreased air movement or transmitted upper airway sounds. No decreased breath sounds, wheezing, rhonchi or rales.  Chest:     Chest wall: No tenderness.  Musculoskeletal:        General:  Normal range of motion.     Cervical back: Normal range of motion and neck supple. Normal range of motion.  Lymphadenopathy:     Cervical: No cervical adenopathy.  Skin:    General: Skin is warm and dry.     Findings: No erythema or rash.  Neurological:     General: No focal deficit present.     Mental Status: She is alert and oriented to person, place, and time.  Psychiatric:        Mood and Affect: Mood normal.        Behavior: Behavior normal.     Visual Acuity Right Eye Distance:   Left Eye Distance:   Bilateral Distance:    Right Eye Near:   Left Eye Near:    Bilateral Near:     UC Couse / Diagnostics / Procedures:     Radiology No results found.  Procedures Procedures (including critical care time) EKG  Pending results:  Labs Reviewed - No data to display  Medications Ordered in UC: Medications - No data to display  UC Diagnoses / Final Clinical Impressions(s)   I have reviewed the triage vital signs and the nursing notes.  Pertinent labs & imaging results that were available during my care of the patient were reviewed by me and considered in my medical decision making (see chart for details).    Final diagnoses:  COVID-19   Patient advised that a positive home COVID-19 test is sufficient proof of her having COVID-19 at this time.  Repeat COVID-19 testing is a waste of our resources and her money.  Patient provided with a note for her employer.  Patient provided with Paxlovid given her history of cardiac disease and high risk of hospitalization secondary to COVID-19 infection.  Patient provided with Promethazine DM cough syrup and advised to sleep in prone position.  Return precautions advised.  ED Prescriptions     Medication Sig Dispense Auth. Provider   nirmatrelvir & ritonavir (PAXLOVID, 150/100,) 10 x 150 MG & 10 x 100MG  TBPK Take 3 tablets by mouth 2 (two) times daily for 5 days. 30 tablet Theadora Rama Scales, PA-C   promethazine-dextromethorphan  (PROMETHAZINE-DM) 6.25-15 MG/5ML syrup Take 5 mLs by mouth 4 (four) times daily as needed for cough. 118 mL Theadora Rama Scales, PA-C      PDMP not reviewed this encounter.  Disposition Upon Discharge:  Condition: stable for discharge home Home: take medications as prescribed; routine discharge instructions as discussed; follow up as advised.  Patient presented with an acute illness with associated systemic symptoms and significant discomfort requiring urgent management. In my opinion, this is a condition that a prudent lay person (someone who possesses an average knowledge of health and medicine) may potentially expect to result in complications if not addressed urgently such as respiratory distress, impairment of bodily function or dysfunction of bodily organs.   Routine symptom specific,  illness specific and/or disease specific instructions were discussed with the patient and/or caregiver at length.   As such, the patient has been evaluated and assessed, work-up was performed and treatment was provided in alignment with urgent care protocols and evidence based medicine.  Patient/parent/caregiver has been advised that the patient may require follow up for further testing and treatment if the symptoms continue in spite of treatment, as clinically indicated and appropriate.  If the patient was tested for COVID-19, Influenza and/or RSV, then the patient/parent/guardian was advised to isolate at home pending the results of his/her diagnostic coronavirus test and potentially longer if they're positive. I have also advised pt that if his/her COVID-19 test returns positive, it's recommended to self-isolate for at least 10 days after symptoms first appeared AND until fever-free for 24 hours without fever reducer AND other symptoms have improved or resolved. Discussed self-isolation recommendations as well as instructions for household member/close contacts as per the [facility] and [place] DHHS, and also gave  patient the COVID packet with this information.  Patient/parent/caregiver has been advised to return to the [facility] or PCP in 3-5 days if no better; to PCP or the Emergency Department if new signs and symptoms develop, or if the current signs or symptoms continue to change or worsen for further workup, evaluation and treatment as clinically indicated and appropriate  The patient will follow up with their current PCP if and as advised. If the patient does not currently have a PCP we will assist them in obtaining one.   The patient may need specialty follow up if the symptoms continue, in spite of conservative treatment and management, for further workup, evaluation, consultation and treatment as clinically indicated and appropriate.  Patient/parent/caregiver verbalized understanding and agreement of plan as discussed.  All questions were addressed during visit.  Please see discharge instructions below for further details of plan.  Discharge Instructions:   Discharge Instructions      I have enclosed information about quarantine and isolation along with prone position therapy but I hope you find helpful.  I have sent a prescription for Paxlovid to your pharmacy.  Please take 3 tablets by mouth twice daily for the next 5 days.  If you can start them today and get your first 2 doses in today that would be ideal.  I provided you with a cough medicine called Promethazine DM that you can take up to 4 times daily as needed for persistent coughing.  This medication can make you sleepy, some patients prefer to only take this medication in the evening.  If you are not feeling better in the next 5 to 7 days, please return for repeat evaluation.  Thank you for visiting urgent care today.      This office note has been dictated using Teaching laboratory technician.  Unfortunately, this method of dictation can sometimes lead to typographical or grammatical errors.  I apologize for your inconvenience in  advance if this occurs.  Please do not hesitate to reach out to me if clarification is needed.      Theadora Rama Scales, [place] 01/06/22 [phone]

## 2022-01-06 NOTE — ED Triage Notes (Signed)
Complains of cough, congestion, body aches and overall does not feel well. Symptoms started last Thursday 9/14.  Covid test performed Saturday 9/17-positive.  Patient has used tylenol.

## 2022-01-06 NOTE — Discharge Instructions (Addendum)
I have enclosed information about quarantine and isolation along with prone position therapy but I hope you find helpful.  I have sent a prescription for Paxlovid to your pharmacy.  Please take 3 tablets by mouth twice daily for the next 5 days.  If you can start them today and get your first 2 doses in today that would be ideal.  I provided you with a cough medicine called Promethazine DM that you can take up to 4 times daily as needed for persistent coughing.  This medication can make you sleepy, some patients prefer to only take this medication in the evening.  If you are not feeling better in the next 5 to 7 days, please return for repeat evaluation.  Thank you for visiting urgent care today.

## 2022-01-10 ENCOUNTER — Ambulatory Visit: Payer: BC Managed Care – PPO | Admitting: Cardiology

## 2022-01-22 ENCOUNTER — Telehealth: Payer: Self-pay | Admitting: Cardiology

## 2022-01-22 NOTE — Telephone Encounter (Signed)
LVM to reschedule Myoview & fu appt, please schedule

## 2022-01-24 ENCOUNTER — Ambulatory Visit: Payer: BC Managed Care – PPO | Admitting: Cardiology

## 2022-02-04 ENCOUNTER — Encounter
Admission: RE | Admit: 2022-02-04 | Discharge: 2022-02-04 | Disposition: A | Discharge status: Home / Self Care | Payer: BC Managed Care – PPO | Source: Ambulatory Visit | Attending: Cardiology | Admitting: Cardiology

## 2022-02-04 DIAGNOSIS — I5022 Chronic systolic (congestive) heart failure: Secondary | ICD-10-CM | POA: Insufficient documentation

## 2022-02-04 MED ORDER — TECHNETIUM TC 99M TETROFOSMIN IV KIT
30.9600 | PACK | Freq: Once | INTRAVENOUS | Status: AC | PRN
  Administered 2022-02-04: 30.96 via INTRAVENOUS

## 2022-02-04 MED ORDER — TECHNETIUM TC 99M TETROFOSMIN IV KIT
10.0000 | PACK | Freq: Once | INTRAVENOUS | Status: AC
  Administered 2022-02-04: 10.13 via INTRAVENOUS

## 2022-02-04 MED ORDER — REGADENOSON 0.4 MG/5ML IV SOLN
0.4000 mg | Freq: Once | INTRAVENOUS | Status: AC
  Administered 2022-02-04: 0.4 mg via INTRAVENOUS

## 2022-02-05 LAB — NM MYOCAR MULTI W/SPECT W/WALL MOTION / EF
LV dias vol: 65 mL (ref 46–106)
LV sys vol: 27 mL
Nuc Stress EF: 58 %
Peak HR: 98 {beats}/min
Rest HR: 67 {beats}/min
Rest Nuclear Isotope Dose: 10.1 mCi
SDS: 0
SRS: 0
SSS: 0
ST Depression (mm): 0 mm
Stress Nuclear Isotope Dose: 31 mCi
TID: 0.94

## 2022-02-13 ENCOUNTER — Ambulatory Visit: Payer: BC Managed Care – PPO | Attending: Cardiology | Admitting: Cardiology

## 2022-02-13 NOTE — Progress Notes (Incomplete)
Cardiology Clinic Note   Patient Name: Isabella Burch Date of Encounter: 02/13/2022  Primary Care Provider:  Jacky Kindle, FNP Primary Cardiologist:  Isabella Bears, MD  Patient Profile    51 year old female with history of hypertension, hyperlipidemia, anxiety, rheumatoid arthritis, tobacco use, coronary artery disease with previous NSTEMI and finding of Takotsubo cardiomyopathy, who is here today for follow-up on her coronary artery disease.  Past Medical History    Past Medical History:  Diagnosis Date   Anxiety    Ascending aorta dilatation (HCC)    a. 11/2020 CTA chest: 3.8cm asc Ao.   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 11/2020 Echo: EF 20-25%; b. 02/2021 Echo: EF 55-60%, no rwma.   Extensor tendon rupture, non-traumatic, hand and wrist    right   Heart murmur    History of kidney stones    Hypertension    Nonobstructive CAD (coronary artery disease)    a. 11/2020 NSTEMI/Cath: LM nl, LAD 78m, D1/2 nl, LCX min irregs, RCA nl, RPDA/RPAV nl.   Rheumatoid arthritis (HCC) dx 2006   BILATERAL HANDS AND RIGHT ANKLE   Takotsubo cardiomyopathy    a. 11/2020 Echo: EF 20-25%, mid-apical ant, inf, and apical AK. Nl RV size/fxn. Mild MR; b. 11/2020 Cath: nonobs dzs; c. 02/2021 Echo: EF 55-60%, no rwma, nl RV fxn, Mild MR, Asc Ao 86mm.   Tobacco abuse    Past Surgical History:  Procedure Laterality Date   CARDIAC VALVE REPLACEMENT     CYSTOSCOPY WITH URETEROSCOPY, STONE BASKETRY AND STENT PLACEMENT  2011   LEFT HEART CATH AND CORONARY ANGIOGRAPHY N/A 12/10/2020   Procedure: LEFT HEART CATH AND CORONARY ANGIOGRAPHY;  Surgeon: Isabella Ouch, MD;  Location: [facility];  Service: Cardiovascular;  Laterality: N/A;   LEFT WRIST EXTENSOR TENDON REPAIR     TENDON TRANSFER Right 11/09/2014   Procedure: RIGHT THUMB EIP TO EPL TENDON TRANSFER;  Surgeon: Bradly Bienenstock, MD;  Location: [facility];  Service: Orthopedics;   Laterality: Right;   TUBAL LIGATION      Allergies  Allergies  Allergen Reactions   Enbrel [Etanercept] Swelling   Humira [Adalimumab] Other (See Comments)    kidney dysfunction    History of Present Illness      Home Medications    Current Outpatient Medications  Medication Sig Dispense Refill   acetaminophen (TYLENOL) 500 MG tablet Take 500 mg by mouth every 6 (six) hours as needed for moderate pain, mild pain, headache or fever.     aspirin EC 81 MG EC tablet Take 1 tablet (81 mg total) by mouth daily. Swallow whole. 30 tablet 11   atorvastatin (LIPITOR) 40 MG tablet Take 1 tablet (40 mg total) by mouth daily. 90 tablet 3   DULoxetine (CYMBALTA) 60 MG capsule Take 1 capsule (60 mg total) by mouth daily. 90 capsule 3   fluticasone (FLONASE) 50 MCG/ACT nasal spray SPRAY 2 SPRAYS INTO EACH NOSTRIL EVERY DAY (Patient not taking: Reported on 01/06/2022) 16 mL 1   leflunomide (ARAVA) 10 MG tablet TAKE 1 TABLET BY MOUTH ONCE A DAY     lisinopril (ZESTRIL) 2.5 MG tablet Take 1 tablet (2.5 mg total) by mouth daily. 90 tablet 3   meclizine (ANTIVERT) 25 MG tablet Take 1 tablet (25 mg total) by mouth 3 (three) times daily as needed for dizziness. 30 tablet 0   meloxicam (MOBIC) 15 MG tablet TAKE 1 TABLET (15 MG  TOTAL) BY MOUTH DAILY. TAKE WITH MEAL. 30 tablet 0   methocarbamol (ROBAXIN) 750 MG tablet TAKE 1 TABLET (750 MG TOTAL) BY MOUTH 2 (TWO) TIMES DAILY AS NEEDED FOR MUSCLE SPASMS. 60 tablet 0   metoprolol succinate (TOPROL-XL) 25 MG 24 hr tablet Take 0.5 tablets (12.5 mg total) by mouth daily. 45 tablet 3   ondansetron (ZOFRAN ODT) 4 MG disintegrating tablet Take 1 tablet (4 mg total) by mouth every 8 (eight) hours as needed for nausea or vomiting. 20 tablet 0   ORENCIA CLICKJECT 125 MG/ML SOAJ Inject 1 Syringe into the skin once a week. (Patient not taking: Reported on 12/05/2021)     promethazine-dextromethorphan (PROMETHAZINE-DM) 6.25-15 MG/5ML syrup Take 5 mLs by mouth 4 (four) times  daily as needed for cough. 118 mL 0   VITAMIN D PO Take by mouth. 4000IU     No current facility-administered medications for this visit.     Family History    Family History  Problem Relation Age of Onset   Hypothyroidism Mother    Coronary artery disease Mother    Hyperlipidemia Father    Hypertension Father    Diabetes Father    She indicated that her mother is alive. She indicated that her father is deceased. She indicated that both of her sisters are alive. She indicated that her daughter is alive. She indicated that her son is alive.  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    Employer:  [employer]    Comment: Engineer, maintenance (IT)   Occupation: Estate agent    Comment: Part-time job  Tobacco Use   Smoking status: Some Days    Types: E-cigarettes   Smokeless tobacco: Never   Tobacco comments:    0.5 ppd for much of her life but currently smoking 2-4 cigarettes/day (11/2020)  Vaping Use   Vaping Use: Never used  Substance and Sexual Activity   Alcohol use: No    Alcohol/week: 0.0 standard drinks of alcohol   Drug use: No   Sexual activity: Not on file  Other Topics Concern   Not on file  Social History Narrative   Isabella Burch grew up in [place]. She lives at home with her husband, two children and a granddaughter. They have 2 dogs (great pyreneees and dachbull).  She works at the [employer] in [employer]. She enjoys working and being around children.    Social Determinants of Health   Financial Resource Strain: Not on file  Food Insecurity: Not on file  Transportation Needs: Not on file  Physical Activity: Not on file  Stress: Not on file  Social Connections: Not on file  Intimate Partner Violence: Not on file      Review of Systems    General:  No chills, fever, night sweats or weight changes.  Cardiovascular:  No chest pain, dyspnea on exertion, edema, orthopnea, palpitations, paroxysmal nocturnal dyspnea. Dermatological: No rash, lesions/masses Respiratory: No cough, dyspnea Urologic: No hematuria, dysuria Abdominal:   No nausea, vomiting, diarrhea, bright red blood per rectum, melena, or hematemesis Neurologic:  No visual changes, wkns, changes in mental status. All other systems reviewed and are otherwise negative except as noted above.     Physical Exam    VS:  LMP 10/19/2020 Comment: pt has not had a menstrual cycle in over a year, not pregnant or breastfeeding. Pregnancy waiver signed. , BMI There is no height or weight on file to calculate BMI.     GEN: Well nourished, well developed, in no acute distress. HEENT: normal. Neck: Supple, no JVD, carotid bruits, or masses. Cardiac: RRR, no murmurs, rubs, or gallops. No clubbing, cyanosis, edema.  Radials/DP/PT 2+ and equal bilaterally.  Respiratory:  Respirations regular and unlabored, clear to auscultation bilaterally. GI: Soft, nontender, nondistended, BS + x 4. MS: no deformity or atrophy. Skin: warm and dry, no rash. Neuro:  Strength and sensation are intact. Psych: Normal affect.  Accessory Clinical Findings    ECG personally reviewed by me today- *** - No acute changes  Lab Results  Component Value Date   WBC 5.5 09/10/2021   HGB 12.5 09/10/2021   HCT 37.4 09/10/2021   MCV 97 09/10/2021   PLT 359 09/10/2021   Lab Results  Component Value Date   CREATININE 0.95 09/10/2021   BUN 20 09/10/2021   NA 143 09/10/2021   K 4.0 09/10/2021   CL 104 09/10/2021   CO2 26 09/10/2021   Lab Results  Component Value Date   ALT 19 09/10/2021   AST 21 09/10/2021   ALKPHOS 107 09/10/2021   BILITOT 0.2 09/10/2021   Lab Results  Component Value Date   CHOL 135 09/10/2021   HDL 57 09/10/2021   LDLCALC 62 09/10/2021   TRIG  83 09/10/2021   CHOLHDL 2.4 09/10/2021    Lab Results  Component Value Date   HGBA1C 5.7 (H) 12/10/2020    Assessment & Plan   1.  ***  Isadore Palecek, NP 02/13/2022, 1:43 PM

## 2022-02-14 ENCOUNTER — Encounter: Payer: Self-pay | Admitting: Cardiology

## 2022-03-01 DIAGNOSIS — M549 Dorsalgia, unspecified: Secondary | ICD-10-CM | POA: Diagnosis present

## 2022-03-01 DIAGNOSIS — X500XXA Overexertion from strenuous movement or load, initial encounter: Secondary | ICD-10-CM | POA: Diagnosis not present

## 2022-03-01 DIAGNOSIS — M546 Pain in thoracic spine: Secondary | ICD-10-CM | POA: Diagnosis not present

## 2022-03-01 DIAGNOSIS — Z5321 Procedure and treatment not carried out due to patient leaving prior to being seen by health care provider: Secondary | ICD-10-CM | POA: Insufficient documentation

## 2022-03-02 ENCOUNTER — Ambulatory Visit
Admission: EM | Admit: 2022-03-02 | Discharge: 2022-03-02 | Disposition: A | Discharge status: Home / Self Care | Payer: BC Managed Care – PPO | Attending: Urgent Care | Admitting: Urgent Care

## 2022-03-02 ENCOUNTER — Other Ambulatory Visit: Payer: Self-pay

## 2022-03-02 ENCOUNTER — Emergency Department
Admission: EM | Admit: 2022-03-02 | Discharge: 2022-03-02 | Discharge status: Against Medical Advice | Payer: BC Managed Care – PPO | Attending: Emergency Medicine | Admitting: Emergency Medicine

## 2022-03-02 DIAGNOSIS — I509 Heart failure, unspecified: Secondary | ICD-10-CM | POA: Diagnosis not present

## 2022-03-02 DIAGNOSIS — I519 Heart disease, unspecified: Secondary | ICD-10-CM | POA: Diagnosis not present

## 2022-03-02 DIAGNOSIS — S29012A Strain of muscle and tendon of back wall of thorax, initial encounter: Secondary | ICD-10-CM | POA: Diagnosis not present

## 2022-03-02 DIAGNOSIS — M546 Pain in thoracic spine: Secondary | ICD-10-CM | POA: Diagnosis not present

## 2022-03-02 MED ORDER — PREDNISONE 20 MG PO TABS: ORAL_TABLET | ORAL | 0 refills | Status: DC

## 2022-03-02 MED ORDER — TIZANIDINE HCL 4 MG PO TABS: 4.0000 mg | ORAL_TABLET | Freq: Every day | ORAL | 0 refills | Status: DC

## 2022-03-02 NOTE — ED Triage Notes (Signed)
Pt to ER for back pain, upper back that started Thursday. Pt states "I injured it". She was filling water jugs and she over filled she liften them too heavy and she thinks she pulled a muscle. Pt is CAOx4 and in no acute distress.

## 2022-03-02 NOTE — ED Triage Notes (Addendum)
Pt c/o upper back pain-started 11/9 after lifting water jugs at [facility] gait-last dose motrin 400mg  ~6am

## 2022-03-02 NOTE — ED Provider Notes (Signed)
[facility]  Note:  This document was prepared using Conservation officer, historic buildings and may include unintentional dictation errors.  MRN: [id number] DOB: Aug 29, 1970  Subjective:   Isabella Burch is a 51 y.o. female presenting for 3-day history of acute onset persistent moderate to severe thoracic back pain worse to the right side.  Symptoms started when she attempted to lift several water drugs that were quite heavy while at work.  No fall, trauma, numbness or tingling, saddle paresthesia, changes to bowel or urinary habits, radicular symptoms.  Patient has a history of rheumatoid arthritis.  Also has a history of congestive heart failure.  Last echocardiogram showed preserved ejection fraction.  She also has a history of an NSTEMI, heart disease.  She has been using Motrin for relief of her symptoms.  No current facility-administered medications for this encounter.  Current Outpatient Medications:    acetaminophen (TYLENOL) 500 MG tablet, Take 500 mg by mouth every 6 (six) hours as needed for moderate pain, mild pain, headache or fever., Disp: , Rfl:    aspirin EC 81 MG EC tablet, Take 1 tablet (81 mg total) by mouth daily. Swallow whole., Disp: 30 tablet, Rfl: 11   atorvastatin (LIPITOR) 40 MG tablet, Take 1 tablet (40 mg total) by mouth daily., Disp: 90 tablet, Rfl: 3   DULoxetine (CYMBALTA) 60 MG capsule, Take 1 capsule (60 mg total) by mouth daily., Disp: 90 capsule, Rfl: 3   fluticasone (FLONASE) 50 MCG/ACT nasal spray, SPRAY 2 SPRAYS INTO EACH NOSTRIL EVERY DAY (Patient not taking: Reported on 01/06/2022), Disp: 16 mL, Rfl: 1   leflunomide (ARAVA) 10 MG tablet, TAKE 1 TABLET BY MOUTH ONCE A DAY, Disp: , Rfl:    lisinopril (ZESTRIL) 2.5 MG tablet, Take 1 tablet (2.5 mg total) by mouth daily., Disp: 90 tablet, Rfl: 3   meclizine (ANTIVERT) 25 MG tablet, Take 1 tablet (25 mg total) by mouth 3 (three) times daily as needed for dizziness., Disp: 30 tablet, Rfl: 0    meloxicam (MOBIC) 15 MG tablet, TAKE 1 TABLET (15 MG TOTAL) BY MOUTH DAILY. TAKE WITH MEAL., Disp: 30 tablet, Rfl: 0   methocarbamol (ROBAXIN) 750 MG tablet, TAKE 1 TABLET (750 MG TOTAL) BY MOUTH 2 (TWO) TIMES DAILY AS NEEDED FOR MUSCLE SPASMS., Disp: 60 tablet, Rfl: 0   metoprolol succinate (TOPROL-XL) 25 MG 24 hr tablet, Take 0.5 tablets (12.5 mg total) by mouth daily., Disp: 45 tablet, Rfl: 3   ondansetron (ZOFRAN ODT) 4 MG disintegrating tablet, Take 1 tablet (4 mg total) by mouth every 8 (eight) hours as needed for nausea or vomiting., Disp: 20 tablet, Rfl: 0   ORENCIA CLICKJECT 125 MG/ML SOAJ, Inject 1 Syringe into the skin once a week. (Patient not taking: Reported on 12/05/2021), Disp: , Rfl:    promethazine-dextromethorphan (PROMETHAZINE-DM) 6.25-15 MG/5ML syrup, Take 5 mLs by mouth 4 (four) times daily as needed for cough., Disp: 118 mL, Rfl: 0   VITAMIN D PO, Take by mouth. 4000IU, Disp: , Rfl:    Allergies  Allergen Reactions   Enbrel [Etanercept] Swelling   Humira [Adalimumab] Other (See Comments)    kidney dysfunction    Past Medical History:  Diagnosis Date   Anxiety    Ascending aorta dilatation (HCC)    a. 11/2020 CTA chest: 3.8cm asc Ao.   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 11/2020 Echo: EF 20-25%; b. 02/2021 Echo: EF 55-60%, no rwma.   Extensor  tendon rupture, non-traumatic, hand and wrist    right   Heart murmur    History of kidney stones    Hypertension    Nonobstructive CAD (coronary artery disease)    a. 11/2020 NSTEMI/Cath: LM nl, LAD 37m, D1/2 nl, LCX min irregs, RCA nl, RPDA/RPAV nl.   Rheumatoid arthritis (HCC) dx 2006   BILATERAL HANDS AND RIGHT ANKLE   Takotsubo cardiomyopathy    a. 11/2020 Echo: EF 20-25%, mid-apical ant, inf, and apical AK. Nl RV size/fxn. Mild MR; b. 11/2020 Cath: nonobs dzs; c. 02/2021 Echo: EF 55-60%, no rwma, nl RV fxn, Mild MR, Asc Ao 15mm.   Tobacco abuse      Past Surgical History:   Procedure Laterality Date   CARDIAC VALVE REPLACEMENT     CYSTOSCOPY WITH URETEROSCOPY, STONE BASKETRY AND STENT PLACEMENT  2011   LEFT HEART CATH AND CORONARY ANGIOGRAPHY N/A 12/10/2020   Procedure: LEFT HEART CATH AND CORONARY ANGIOGRAPHY;  Surgeon: Iran Ouch, MD;  Location: [facility];  Service: Cardiovascular;  Laterality: N/A;   LEFT WRIST EXTENSOR TENDON REPAIR     TENDON TRANSFER Right 11/09/2014   Procedure: RIGHT THUMB EIP TO EPL TENDON TRANSFER;  Surgeon: Bradly Bienenstock, MD;  Location: [facility];  Service: Orthopedics;  Laterality: Right;   TUBAL LIGATION      Family History  Problem Relation Age of Onset   Hypothyroidism Mother    Coronary artery disease Mother    Hyperlipidemia Father    Hypertension Father    Diabetes Father     Social History   Tobacco Use   Smoking status: Every Day    Types: E-cigarettes   Smokeless tobacco: Never   Tobacco comments:    0.5 ppd for much of her life but currently smoking 2-4 cigarettes/day (11/2020)  Vaping Use   Vaping Use: Every day  Substance Use Topics   Alcohol use: No    Alcohol/week: 0.0 standard drinks of alcohol   Drug use: No    ROS   Objective:   Vitals: BP (!) 147/77 (BP Location: Left Arm)   Pulse 83   Temp 98.3 F (36.8 C) (Oral)   Resp 16   LMP 10/19/2020   SpO2 96%   Physical Exam Constitutional:      General: She is not in acute distress.    Appearance: Normal appearance. She is well-developed. She is not ill-appearing, toxic-appearing or diaphoretic.  HENT:     Head: Normocephalic and atraumatic.     Nose: Nose normal.     Mouth/Throat:     Mouth: Mucous membranes are moist.  Eyes:     General: No scleral icterus.       Right eye: No discharge.        Left eye: No discharge.     Extraocular Movements: Extraocular movements intact.  Cardiovascular:     Rate and Rhythm: Normal rate.  Pulmonary:     Effort: Pulmonary effort is normal.  Musculoskeletal:      Cervical back: No swelling, edema, deformity, erythema, signs of trauma, lacerations, rigidity, spasms, torticollis, tenderness, bony tenderness or crepitus. No pain with movement. Normal range of motion.     Thoracic back: Spasms (right sided paraspinal muscles) and tenderness present. No swelling, edema, deformity, signs of trauma, lacerations or bony tenderness. Decreased range of motion. No scoliosis.     Lumbar back: No swelling, edema, deformity, signs of trauma, lacerations, spasms, tenderness or bony tenderness. Normal range of motion.  Negative right straight leg raise test and negative left straight leg raise test. No scoliosis.       Back:  Skin:    General: Skin is warm and dry.  Neurological:     General: No focal deficit present.     Mental Status: She is alert and oriented to person, place, and time.  Psychiatric:        Mood and Affect: Mood normal.        Behavior: Behavior normal.     Assessment and Plan :   PDMP not reviewed this encounter.  1. Strain of thoracic back region   2. Acute right-sided thoracic back pain   3. Heart disease   4. Congestive heart failure, unspecified HF chronicity, unspecified heart failure type [facility])     Deferred imaging given lack of trauma, physical exam findings warranting x-rays.  Patient has preserved ejection fraction and given the severity of her pain, heart disease will avoid NSAIDs and instead use prednisone.  Use muscle relaxant as well.  Discussed back care. Counseled patient on potential for adverse effects with medications prescribed/recommended today, ER and return-to-clinic precautions discussed, patient verbalized understanding.    Wallis Bamberg, [place] 03/02/22 [phone]

## 2022-04-02 ENCOUNTER — Other Ambulatory Visit: Payer: Self-pay | Admitting: Nurse Practitioner

## 2022-04-02 NOTE — Telephone Encounter (Signed)
Needs office visit for further refills. (Cancelled x 3) Thank you!

## 2022-04-03 NOTE — Telephone Encounter (Signed)
Atorvastatin 40 mg # 90 only, with message needs appointment for future refills. Sent to pharmacy

## 2022-04-04 NOTE — Telephone Encounter (Signed)
Scheduled 12/28

## 2022-04-15 NOTE — Progress Notes (Unsigned)
Cardiology Clinic Note   Patient Name: Isabella Burch Date of Encounter: 04/17/2022  Primary Care Provider:  Jacky Kindle, FNP Primary Cardiologist:  Lorine Bears, MD  Patient Profile    Isabella Burch is a 51 y.o. female with a past medical history of CAD s/p NSTEMI, stress induced CM, HFrEF, ascending aortic dilation, hypertension, hyperlipidemia who presents to the clinic today for follow-up of stress testing.   Past Medical History    Past Medical History:  Diagnosis Date   Anxiety    Ascending aorta dilatation (HCC)    a. 11/2020 CTA chest: 3.8cm asc Ao.   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 11/2020 Echo: EF 20-25%; b. 02/2021 Echo: EF 55-60%, no rwma.   Extensor tendon rupture, non-traumatic, hand and wrist    right   Heart murmur    History of kidney stones    Hypertension    Nonobstructive CAD (coronary artery disease)    a. 11/2020 NSTEMI/Cath: LM nl, LAD 33m, D1/2 nl, LCX min irregs, RCA nl, RPDA/RPAV nl.   Rheumatoid arthritis (HCC) dx 2006   BILATERAL HANDS AND RIGHT ANKLE   Takotsubo cardiomyopathy    a. 11/2020 Echo: EF 20-25%, mid-apical ant, inf, and apical AK. Nl RV size/fxn. Mild MR; b. 11/2020 Cath: nonobs dzs; c. 02/2021 Echo: EF 55-60%, no rwma, nl RV fxn, Mild MR, Asc Ao 45mm.   Tobacco abuse    Past Surgical History:  Procedure Laterality Date   CARDIAC VALVE REPLACEMENT     CYSTOSCOPY WITH URETEROSCOPY, STONE BASKETRY AND STENT PLACEMENT  2011   LEFT HEART CATH AND CORONARY ANGIOGRAPHY N/A 12/10/2020   Procedure: LEFT HEART CATH AND CORONARY ANGIOGRAPHY;  Surgeon: Iran Ouch, MD;  Location: [facility] INVASIVE CV [facility];  Service: Cardiovascular;  Laterality: N/A;   LEFT WRIST EXTENSOR TENDON REPAIR     TENDON TRANSFER Right 11/09/2014   Procedure: RIGHT THUMB EIP TO EPL TENDON TRANSFER;  Surgeon: Bradly Bienenstock, MD;  Location: [facility];  Service: Orthopedics;  Laterality: Right;   TUBAL LIGATION       Allergies  Allergies  Allergen Reactions   Enbrel [Etanercept] Swelling   Humira [Adalimumab] Other (See Comments)    kidney dysfunction    History of Present Illness    Sarianna Heaton Ney has a past medical history of: CAD.  LHC s/p STEMI 12/10/2020: Mid LAD 20%. Severely elevated LVEDP.  Nuclear stress test 02/04/2022: Normal, low risk study.  HFrEF/stress induced CM/ascending aortic dilatation.  Echo 12/10/2020: EF 20-25%, severely decreased left ventricular function. Regional wall motion abnormalities. Akinesis left ventricular mid-apical anterior wall, inferior wall, and apical segment. Mild MR.  Echo 02/22/2021: EF 55-60%. Mild MR. Borderline dilatation of the ascending aorta, 36 mm.   Hypertension.  Hyperlipidemia.  Lipid panel 09/10/2021: LDL 62, HDL 57, TG 83, total 135.   Ms. Bliss was first evaluated in the hospital by Ward Givens, NP on 12/10/2020 for NSTEMI. LHC showed mild nonobstructive CAD. Echo showed severely decreased ventricular function with an EF of 20-25% consistent with stress cardiomyopathy. Follow-up echo in November 2022 showed normalized left ventricular function. She was last seen in the office on 12/05/2021 by Charlsie Quest, NP. At that time she complained of one episode of chest pressure radiating to the left side of her neck while cleaning out coolers at work. No associated shortness of breath, nausea, vomiting, or palpitations. No episodes after that. Her stress test was normal, low  risk study.   Today, patient denies continued episodes of chest pain but she is having increased DOE and fatigue for the last 2 months. She states she will intermittently become dyspneic walking up three flights of stairs to her daughters apartment. She does not typically notice shortness of breath while working. She reports fatigue has become so bad that she sometimes feels she will fall asleep while driving. She denies shortness of breath at rest. She denies lower extremity edema but  does report abdominal bloating and early satiety with occasional dyspnea after eating. She does not weigh daily. Denies orthopnea or PND. No palpitations.     Home Medications    Current Meds  Medication Sig   acetaminophen (TYLENOL) 500 MG tablet Take 500 mg by mouth every 6 (six) hours as needed for moderate pain, mild pain, headache or fever.   aspirin EC 81 MG EC tablet Take 1 tablet (81 mg total) by mouth daily. Swallow whole.   atorvastatin (LIPITOR) 40 MG tablet Take 1 tablet (40 mg total) by mouth daily. Needs appointment for future refills   DULoxetine (CYMBALTA) 60 MG capsule Take 1 capsule (60 mg total) by mouth daily.   fluticasone (FLONASE) 50 MCG/ACT nasal spray SPRAY 2 SPRAYS INTO EACH NOSTRIL EVERY DAY   leflunomide (ARAVA) 10 MG tablet TAKE 1 TABLET BY MOUTH ONCE A DAY   lisinopril (ZESTRIL) 2.5 MG tablet Take 1 tablet (2.5 mg total) by mouth daily.   meclizine (ANTIVERT) 25 MG tablet Take 1 tablet (25 mg total) by mouth 3 (three) times daily as needed for dizziness.   meloxicam (MOBIC) 15 MG tablet TAKE 1 TABLET (15 MG TOTAL) BY MOUTH DAILY. TAKE WITH MEAL.   methocarbamol (ROBAXIN) 750 MG tablet TAKE 1 TABLET (750 MG TOTAL) BY MOUTH 2 (TWO) TIMES DAILY AS NEEDED FOR MUSCLE SPASMS.   metoprolol succinate (TOPROL-XL) 25 MG 24 hr tablet Take 0.5 tablets (12.5 mg total) by mouth daily.   ondansetron (ZOFRAN ODT) 4 MG disintegrating tablet Take 1 tablet (4 mg total) by mouth every 8 (eight) hours as needed for nausea or vomiting.   ORENCIA CLICKJECT 125 MG/ML SOAJ Inject 1 Syringe into the skin once a week.   predniSONE (DELTASONE) 20 MG tablet Take 2 tablets daily with breakfast.   promethazine-dextromethorphan (PROMETHAZINE-DM) 6.25-15 MG/5ML syrup Take 5 mLs by mouth 4 (four) times daily as needed for cough.   tiZANidine (ZANAFLEX) 4 MG tablet Take 1 tablet (4 mg total) by mouth at bedtime.   VITAMIN D PO Take by mouth. 4000IU    Family History    Family History  Problem  Relation Age of Onset   Hypothyroidism Mother    Coronary artery disease Mother    Hyperlipidemia Father    Hypertension Father    Diabetes Father    She indicated that her mother is alive. She indicated that her father is deceased. She indicated that both of her sisters are alive. She indicated that her daughter is alive. She indicated that her son is alive.   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    Employer: [employer]    Comment: Engineer, maintenance (IT)   Occupation: Estate agent    Comment: Part-time job  Tobacco Use  Smoking status: Every Day    Types: E-cigarettes   Smokeless tobacco: Never   Tobacco comments:    0.5 ppd for much of her life but currently smoking 2-4 cigarettes/day (11/2020)  Vaping Use   Vaping Use: Every day  Substance and Sexual Activity   Alcohol use: No    Alcohol/week: 0.0 standard drinks of alcohol   Drug use: No   Sexual activity: Not on file  Other Topics Concern   Not on file  Social History Narrative   Ashli grew up in [place]. She lives at home with her husband, two children and a granddaughter. They have 2 dogs (great pyreneees and dachbull).  She works at the [employer] in [employer]. She enjoys working and being around children.    Social Determinants of Health   Financial Resource Strain: Not on file  Food Insecurity: Not on file  Transportation Needs: Not on file  Physical Activity: Not on file  Stress: Not on file  Social Connections: Not on file  Intimate Partner Violence: Not on file     Review of Systems    General:  No chills, fever, night sweats or weight changes. Increased fatigue.   Cardiovascular:  No chest pain, edema, orthopnea, palpitations, paroxysmal  nocturnal dyspnea. Positive for DOE, abdominal bloating/early satiety.  Dermatological: No rash, lesions/masses Respiratory: No cough, dyspnea Urologic: No hematuria, dysuria Abdominal:   No nausea, vomiting, diarrhea, bright red blood per rectum, melena, or hematemesis Neurologic:  No visual changes, weakness, changes in mental status. All other systems reviewed and are otherwise negative except as noted above.  Physical Exam    VS:  BP (!) 149/92 (BP Location: Left Arm, Patient Position: Sitting, Cuff Size: Normal)   Pulse 73   Ht 5\' 6"  (1.676 m)   Wt 150 lb 4.8 oz (68.2 kg)   LMP 10/19/2020   SpO2 96%   BMI 24.26 kg/m  , BMI Body mass index is 24.26 kg/m. GEN:  Well nourished, well developed, in no acute distress. HEENT: Normal. Neck: Supple, no JVD, carotid bruits, or masses. Cardiac: RRR, no murmurs, rubs, or gallops. No clubbing, cyanosis, edema.  Radials/DP/PT 2+ and equal bilaterally.  Respiratory:  Respirations regular and unlabored, clear to auscultation bilaterally. GI: Soft, nontender, nondistended. MS: No deformity or atrophy. Skin: Warm and dry, no rash. Neuro: Strength and sensation are intact. Psych: Normal affect.  Accessory Clinical Findings   Recent Labs: 09/10/2021: ALT 19 04/17/2022: BUN 13; Creatinine, Ser 0.79; Hemoglobin 13.7; Magnesium 2.2; Platelets 326; Potassium 4.4; Sodium 140; TSH 1.581   Recent Lipid Panel    Component Value Date/Time   CHOL 135 09/10/2021 1455   TRIG 83 09/10/2021 1455   HDL 57 09/10/2021 1455   CHOLHDL 2.4 09/10/2021 1455   CHOLHDL 3.6 12/10/2020 0635   VLDL 15 12/10/2020 0635   LDLCALC 62 09/10/2021 1455    HYPERTENSION CONTROL Vitals:   04/17/22 0811 04/17/22 1014  BP: (!) 149/92 (!) 140/98    The patient's blood pressure is elevated above target today.  In order to address the patient's elevated BP: Blood pressure will be monitored at home to determine if medication changes need to be made.     ECG not  indicated today.   Assessment & Plan   CAD. Nonobstructive CAD per LHC following NSTEMI in August 2022. Patient had one episode of chest pain in August 2023. Stress test October 2023 was normal. She denies continued chest pain.  Continue aspirin, atorvastatin, and  metoprolol.  HFrEF/stress cardiomyopathy/DOE/Fatigue. Echo showed severely reduced left ventricular function consistent with stress cardiomyopathy in August 2022. Her left ventricular function recovered per echo in November 2022. Patient complains of DOE particularly with walking three flights of stairs at her daughter's apartment. Has also noticed increased fatigue over the last 2 months. She reports abdominal bloating and early satiety. Euvolemic and well compensated on exam. Breath sounds clear to auscultation. No lower extremity edema. Abdomen soft and non-distended. She will start weighing daily. Will check CBC, BMP, MG, TSH today. Will order echo.  Hypertension. BP today 149/92 at intake and 140/98 at recheck. She just took her medication less than an hour ago. She checks her BP a few times a week at home and it is usually in the 130s/70s. She denies headaches or dizziness. Continue metoprolol. She will keep 2 week BP log and call if BP consistently >130/80. Will consider addition of amlodipine at that time.  Hyperlipidemia. LDL May 2023 62, at goal. Continue atorvastatin.           Disposition: CBC, BMP, Mg, TSH today. Echo for DOE. Return in 3 months or sooner as needed.    Etta Grandchild. Doreather Hoxworth, DNP, NP-C     04/17/2022, 10:13 AM [facility] [address] Office [phone] Fax [phone]   I spent 13 minutes examining this patient, reviewing medications, and using patient centered shared decision making involving her cardiac care.  Prior to her visit I spent greater than 20 minutes reviewing her past medical history,  medications, and prior cardiac tests.

## 2022-04-17 ENCOUNTER — Encounter: Payer: Self-pay | Admitting: Cardiology

## 2022-04-17 ENCOUNTER — Other Ambulatory Visit
Admission: RE | Admit: 2022-04-17 | Discharge: 2022-04-17 | Disposition: A | Discharge status: Home / Self Care | Payer: BC Managed Care – PPO | Source: Ambulatory Visit | Attending: Cardiology | Admitting: Cardiology

## 2022-04-17 ENCOUNTER — Ambulatory Visit: Payer: BC Managed Care – PPO | Attending: Cardiology | Admitting: Student

## 2022-04-17 VITALS — BP 140/98 | HR 73 | Ht 66.0 in | Wt 150.3 lb

## 2022-04-17 DIAGNOSIS — E785 Hyperlipidemia, unspecified: Secondary | ICD-10-CM | POA: Diagnosis not present

## 2022-04-17 DIAGNOSIS — R5383 Other fatigue: Secondary | ICD-10-CM

## 2022-04-17 DIAGNOSIS — I5022 Chronic systolic (congestive) heart failure: Secondary | ICD-10-CM | POA: Insufficient documentation

## 2022-04-17 DIAGNOSIS — F32A Depression, unspecified: Secondary | ICD-10-CM | POA: Insufficient documentation

## 2022-04-17 DIAGNOSIS — R0609 Other forms of dyspnea: Secondary | ICD-10-CM

## 2022-04-17 DIAGNOSIS — I10 Essential (primary) hypertension: Secondary | ICD-10-CM | POA: Insufficient documentation

## 2022-04-17 DIAGNOSIS — I2510 Atherosclerotic heart disease of native coronary artery without angina pectoris: Secondary | ICD-10-CM

## 2022-04-17 DIAGNOSIS — I5181 Takotsubo syndrome: Secondary | ICD-10-CM

## 2022-04-17 LAB — CBC
HCT: 41.8 % (ref 36.0–46.0)
Hemoglobin: 13.7 g/dL (ref 12.0–15.0)
MCH: 31.3 pg (ref 26.0–34.0)
MCHC: 32.8 g/dL (ref 30.0–36.0)
MCV: 95.4 fL (ref 80.0–100.0)
Platelets: 326 10*3/uL (ref 150–400)
RBC: 4.38 MIL/uL (ref 3.87–5.11)
RDW: 12.5 % (ref 11.5–15.5)
WBC: 5.5 10*3/uL (ref 4.0–10.5)
nRBC: 0 % (ref 0.0–0.2)

## 2022-04-17 LAB — BASIC METABOLIC PANEL
Anion gap: 7 (ref 5–15)
BUN: 13 mg/dL (ref 6–20)
CO2: 27 mmol/L (ref 22–32)
Calcium: 9.1 mg/dL (ref 8.9–10.3)
Chloride: 106 mmol/L (ref 98–111)
Creatinine, Ser: 0.79 mg/dL (ref 0.44–1.00)
GFR, Estimated: >60 mL/min (ref >60)
Glucose, Bld: 110 mg/dL — ABNORMAL HIGH (ref 70–99)
Potassium: 4.4 mmol/L (ref 3.5–5.1)
Sodium: 140 mmol/L (ref 135–145)

## 2022-04-17 LAB — TSH: TSH: 1.581 u[IU]/mL (ref 0.350–4.500)

## 2022-04-17 LAB — MAGNESIUM: Magnesium: 2.2 mg/dL (ref 1.7–2.4)

## 2022-04-17 NOTE — Progress Notes (Signed)
Reassuring labs without any findings to explain symptoms.

## 2022-04-17 NOTE — Patient Instructions (Addendum)
Medication Instructions:  Your physician recommends that you continue on your current medications as directed. Please refer to the Current Medication list given to you today.  *If you need a refill on your cardiac medications before your next appointment, please call your pharmacy*  Lab Work: BMP, CBC, TSH, and Magnesium level to be drawn today  - Please go to the [facility]. You will check in at the front desk to the right as you walk into the atrium. Valet Parking is offered if needed. - No appointment needed. You may go any day between 7 am and 6 pm.  If you have labs (blood work) drawn today and your tests are completely normal, you will receive your results only by: MyChart Message (if you have MyChart) OR A paper copy in the mail If you have any lab test that is abnormal or we need to change your treatment, we will call you to review the results.  Testing/Procedures: Your physician has requested that you have an echocardiogram. Echocardiography is a painless test that uses sound waves to create images of your heart. It provides your doctor with information about the size and shape of your heart and how well your heart's chambers and valves are working. This procedure takes approximately one hour. There are no restrictions for this procedure. Please do NOT wear cologne, perfume, aftershave, or lotions (deodorant is allowed). Please arrive 15 minutes prior to your appointment time.   Follow-Up: At [facility], you and your health needs are our priority.  As part of our continuing mission to provide you with exceptional heart care, we have created designated Provider Care Teams.  These Care Teams include your primary Cardiologist (physician) and Advanced Practice Providers (APPs -  Physician Assistants and Nurse Practitioners) who all work together to provide you with the care you need, when you need it.  We recommend signing up for the patient portal called "MyChart".  Sign  up information is provided on this After Visit Summary.  MyChart is used to connect with patients for Virtual Visits (Telemedicine).  Patients are able to view lab/test results, encounter notes, upcoming appointments, etc.  Non-urgent messages can be sent to your provider as well.   To learn more about what you can do with MyChart, go to [email or web address].    Your next appointment:   3 month(s)  The format for your next appointment:   In Person  Provider:   You may see Lorine Bears, MD or one of the following Advanced Practice Providers on your designated Care Team:   Nicolasa Ducking, NP Eula Listen, PA-C Cadence Fransico Michael, PA-C Charlsie Quest, NP   Other Instructions Please monitor blood pressure readings and call our office if blood pressure is consistently greater than 130/80.  Important Information About Sugar

## 2022-05-01 ENCOUNTER — Ambulatory Visit: Payer: BC Managed Care – PPO

## 2022-05-16 ENCOUNTER — Ambulatory Visit: Payer: BC Managed Care – PPO | Attending: Cardiology

## 2022-05-16 DIAGNOSIS — R0609 Other forms of dyspnea: Secondary | ICD-10-CM

## 2022-05-16 LAB — ECHOCARDIOGRAM COMPLETE
AR max vel: 2.75 cm2
AV Area VTI: 2.57 cm2
AV Area mean vel: 2.52 cm2
AV Mean grad: 3 mmHg
AV Peak grad: 6.7 mmHg
Ao pk vel: 1.29 m/s
Area-P 1/2: 3.31 cm2
S' Lateral: 3.1 cm

## 2022-05-20 NOTE — Progress Notes (Signed)
Heart squeeze has improved up to 55-60% from previous 20-25% (in 11/2020), no wall motion abnormalities, and no valvular abnormalities noted.

## 2022-07-02 ENCOUNTER — Ambulatory Visit
Admission: RE | Admit: 2022-07-02 | Discharge: 2022-07-02 | Disposition: A | Discharge status: Home / Self Care | Payer: BC Managed Care – PPO | Source: Ambulatory Visit | Attending: Nurse Practitioner | Admitting: Nurse Practitioner

## 2022-07-02 VITALS — BP 130/87 | HR 70 | Temp 98.0°F | Resp 16

## 2022-07-02 DIAGNOSIS — H6991 Unspecified Eustachian tube disorder, right ear: Secondary | ICD-10-CM | POA: Diagnosis not present

## 2022-07-02 MED ORDER — PREDNISONE 20 MG PO TABS: 20.0000 mg | ORAL_TABLET | Freq: Every day | ORAL | 0 refills | Status: AC

## 2022-07-02 NOTE — ED Triage Notes (Signed)
Pt c/o difficulty hearing from right ear for 9 days. The patient states it is also starting in the left ear. The patient states she has not gotten anything in her ear and does have dizziness at times.

## 2022-07-02 NOTE — Discharge Instructions (Signed)
Prednisone daily for 5 days Increase Flonase to twice daily Follow-up with ear nose and throat if your symptoms or not improving Please go to emergency room for any worsening symptoms

## 2022-07-02 NOTE — ED Provider Notes (Signed)
[facility]    CSN: [id number] Arrival date & time: 07/02/22  1504      History   Chief Complaint Chief Complaint  Patient presents with   Otalgia    HPI Isabella Burch is a 52 y.o. female presents for evaluation of ear pain.  Patient reports 9 days of right ear fullness, pressure, and intermittent pain.  Denies any drainage from the ear, fevers.  Does report some mild sinus congestion but otherwise no URI symptoms.  She does have a complex history of rheumatoid arthritis and CHF.  She states she had a rheumatoid flare prior to onset of symptoms and her TMJ joint and feels this may have caused an issue.  She takes Flonase daily.  No other concerns at this time.   Otalgia   Past Medical History:  Diagnosis Date   Anxiety    Ascending aorta dilatation ([place])    a. 11/2020 CTA chest: 3.8cm asc Ao.   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 11/2020 Echo: EF 20-25%; b. 02/2021 Echo: EF 55-60%, no rwma.   Extensor tendon rupture, non-traumatic, hand and wrist    right   Heart murmur    History of kidney stones    Hypertension    Nonobstructive CAD (coronary artery disease)    a. 11/2020 NSTEMI/Cath: LM nl, LAD 6m D1/2 nl, LCX min irregs, RCA nl, RPDA/RPAV nl.   Rheumatoid arthritis ([place] dx 2006   BILATERAL HANDS AND RIGHT ANKLE   Takotsubo cardiomyopathy    a. 11/2020 Echo: EF 20-25%, mid-apical ant, inf, and apical AK. Nl RV size/fxn. Mild MR; b. 11/2020 Cath: nonobs dzs; c. 02/2021 Echo: EF 55-60%, no rwma, nl RV fxn, Mild MR, Asc Ao 362m   Tobacco abuse     Patient Active Problem List   Diagnosis Date Noted   Acute pain of right shoulder 09/26/2021   Chronic HFrEF (heart failure with reduced ejection fraction) ([facility]CDonna05/23/2023   Depression, recurrent ([facility]05/23/2023   Encounter for screening for HIV 09/10/2021   Encounter for hepatitis C virus screening test for high risk patient 09/10/2021   Dyslipidemia, goal LDL below 70  09/10/2021   Screening-pulmonary TB 09/10/2021   Need for Tdap vaccination 09/10/2021   Depression 12/11/2020   Stress-induced cardiomyopathy 12/11/2020   HLD (hyperlipidemia) 12/10/2020   Tobacco use 12/10/2020   Ascending aorta dilatation (HC[place]08/21/2022   NSTEMI (non-ST elevated myocardial infarction) ([facility]08/21/2022   Shortness of breath- occasional 05/15/2020   Vitamin D deficiency 02/07/2020   Anxiety 06/14/2019   Acute otitis externa of left ear 06/14/2019   Eustachian tube dysfunction, bilateral 06/14/2019   Low serum vitamin D 06/14/2019   Tobacco use disorder, continuous 06/14/2019   Rheumatoid arthritis (HC[place]08/06/2015   Chest pain 11/22/2015   Panic attacks 12/04/2014   Hypertension 10/08/2014   Screening mammogram for breast cancer 02/25/2013    Past Surgical History:  Procedure Laterality Date   CARDIAC VALVE REPLACEMENT     CYSTOSCOPY WITH URETEROSCOPY, STONE BASKETRY AND STENT PLACEMENT  2011   LEFT HEART CATH AND CORONARY ANGIOGRAPHY N/A 12/10/2020   Procedure: LEFT HEART CATH AND CORONARY ANGIOGRAPHY;  Surgeon: ArWellington HampshireMD;  [facility];  Service: Cardiovascular;  Laterality: N/A;   LEFT WRIST EXTENSOR TENDON REPAIR     TENDON TRANSFER Right 11/09/2014   Procedure: RIGHT THUMB EIP TO EPL TENDON TRANSFER;  Surgeon: FrIran PlanasMD;  Location: [facility];  Service: Orthopedics;  Laterality: Right;   TUBAL LIGATION      OB History     Gravida  2   Para      Term      Preterm      AB      Living  2      SAB      IAB      Ectopic      Multiple      Live Births               Home Medications    Prior to Admission medications   Medication Sig Start Date End Date Taking? Authorizing Provider  acetaminophen (TYLENOL) 500 MG tablet Take 500 mg by mouth every 6 (six) hours as needed for moderate pain, mild pain, headache or fever.    [provider]  aspirin EC 81 MG EC tablet Take 1  tablet (81 mg total) by mouth daily. Swallow whole. 12/12/20   Dwyane Dee, MD  atorvastatin (LIPITOR) 40 MG tablet Take 1 tablet (40 mg total) by mouth daily. Needs appointment for future refills 04/03/22   Theora Gianotti, NP  DULoxetine (CYMBALTA) 60 MG capsule Take 1 capsule (60 mg total) by mouth daily. 09/10/21   Gwyneth Sprout, FNP  fluticasone (FLONASE) 50 MCG/ACT nasal spray SPRAY 2 SPRAYS INTO EACH NOSTRIL EVERY DAY 05/01/21   Flinchum, Kelby Aline, FNP  leflunomide (ARAVA) 10 MG tablet TAKE 1 TABLET BY MOUTH ONCE A DAY 07/17/14   [provider]  lisinopril (ZESTRIL) 2.5 MG tablet Take 1 tablet (2.5 mg total) by mouth daily. 05/01/21 04/26/22  Flinchum, Kelby Aline, FNP  meclizine (ANTIVERT) 25 MG tablet Take 1 tablet (25 mg total) by mouth 3 (three) times daily as needed for dizziness. 04/27/20   Vanessa [place], MD  meloxicam (MOBIC) 15 MG tablet TAKE 1 TABLET (15 MG TOTAL) BY MOUTH DAILY. TAKE WITH MEAL. 10/23/21   Gwyneth Sprout, FNP  methocarbamol (ROBAXIN) 750 MG tablet TAKE 1 TABLET (750 MG TOTAL) BY MOUTH 2 (TWO) TIMES DAILY AS NEEDED FOR MUSCLE SPASMS. 01/01/22   Gwyneth Sprout, FNP  metoprolol succinate (TOPROL-XL) 25 MG 24 hr tablet Take 0.5 tablets (12.5 mg total) by mouth daily. 09/10/21   Gwyneth Sprout, FNP  ondansetron (ZOFRAN ODT) 4 MG disintegrating tablet Take 1 tablet (4 mg total) by mouth every 8 (eight) hours as needed for nausea or vomiting. 04/27/20   Vanessa [place], MD  ORENCIA CLICKJECT 0000000 MG/ML SOAJ Inject 1 Syringe into the skin once a week. 05/30/19   [provider]  predniSONE (DELTASONE) 20 MG tablet Take 1 tablet (20 mg total) by mouth daily with breakfast for 5 days. 07/02/22 07/07/22 Yes Melynda Ripple, NP  promethazine-dextromethorphan (PROMETHAZINE-DM) 6.25-15 MG/5ML syrup Take 5 mLs by mouth 4 (four) times daily as needed for cough. 01/06/22   Lynden Oxford Scales, PA-C  tiZANidine (ZANAFLEX) 4 MG tablet Take 1 tablet (4 mg total) by mouth at  bedtime. 03/02/22   Jaynee Eagles, PA-C  VITAMIN D PO Take by mouth. 4000IU    [provider]    Family History Family History  Problem Relation Age of Onset   Hypothyroidism Mother    Coronary artery disease Mother    Hyperlipidemia Father    Hypertension Father    Diabetes Father     Social History Social History   Tobacco Use   Smoking status: Every Day  Types: E-cigarettes   Smokeless tobacco: Never   Tobacco comments:    0.5 ppd for much of her life but currently smoking 2-4 cigarettes/day (11/2020)  Vaping Use   Vaping Use: Every day  Substance Use Topics   Alcohol use: No    Alcohol/week: 0.0 standard drinks of alcohol   Drug use: No     Allergies   Enbrel [etanercept] and Humira [adalimumab]   Review of Systems Review of Systems  HENT:  Positive for ear pain.      Physical Exam Triage Vital Signs ED Triage Vitals  Enc Vitals Group     BP 07/02/22 1520 130/87     Pulse Rate 07/02/22 1520 70     Resp 07/02/22 1520 16     Temp 07/02/22 1520 98 F (36.7 C)     Temp Source 07/02/22 1520 Oral     SpO2 07/02/22 1520 96 %     Weight --      Height --      Head Circumference --      Peak Flow --      Pain Score 07/02/22 1519 0     Pain Loc --      Pain Edu? --      Excl. in [place]? --    No data found.  Updated Vital Signs BP 130/87 (BP Location: Left Arm)   Pulse 70   Temp 98 F (36.7 C) (Oral)   Resp 16   LMP 10/19/2020   SpO2 96%   Visual Acuity Right Eye Distance:   Left Eye Distance:   Bilateral Distance:    Right Eye Near:   Left Eye Near:    Bilateral Near:     Physical Exam Vitals and nursing note reviewed.  Constitutional:      Appearance: Normal appearance.  HENT:     Head: Normocephalic and atraumatic.     Right Ear: A middle ear effusion is present. Tympanic membrane is not perforated or erythematous.     Left Ear: Tympanic membrane and ear canal normal.  Eyes:     Pupils: Pupils are equal, round, and reactive  to light.  Cardiovascular:     Rate and Rhythm: Normal rate.  Pulmonary:     Effort: Pulmonary effort is normal.  Skin:    General: Skin is warm and dry.  Neurological:     General: No focal deficit present.     Mental Status: She is alert and oriented to person, place, and time.  Psychiatric:        Mood and Affect: Mood normal.        Behavior: Behavior normal.      UC Treatments / Results  Labs (all labs ordered are listed, but only abnormal results are displayed) Labs Reviewed - No data to display  EKG   Radiology No results found.  Procedures Procedures (including critical care time)  Medications Ordered in UC Medications - No data to display  Initial Impression / Assessment and Plan / UC Course  I have reviewed the triage vital signs and the nursing notes.  Pertinent labs & imaging results that were available during my care of the patient were reviewed by me and considered in my medical decision making (see chart for details).     Reviewed exam and symptoms with patient.  Discussed eustachian tube dysfunction.  No indication of infection on exam Will increase Flonase to twice daily Low-dose prednisone for 5 days given history of CHF Will refer  to ENT if symptoms or not improving Follow-up with PCP 1 week for recheck ER precautions reviewed and patient verbalized understanding Final Clinical Impressions(s) / UC Diagnoses   Final diagnoses:  Eustachian tube dysfunction, right     Discharge Instructions      Prednisone daily for 5 days Increase Flonase to twice daily Follow-up with ear nose and throat if your symptoms or not improving Please go to emergency room for any worsening symptoms   ED Prescriptions     Medication Sig Dispense Auth. Provider   predniSONE (DELTASONE) 20 MG tablet Take 1 tablet (20 mg total) by mouth daily with breakfast for 5 days. 5 tablet Melynda Ripple, NP      PDMP not reviewed this encounter.   Melynda Ripple,  NP 07/02/22 [phone]

## 2022-07-03 ENCOUNTER — Other Ambulatory Visit: Payer: Self-pay | Admitting: Nurse Practitioner

## 2022-07-16 ENCOUNTER — Telehealth: Payer: Self-pay | Admitting: Cardiovascular Disease

## 2022-07-16 ENCOUNTER — Other Ambulatory Visit: Payer: Self-pay | Admitting: Cardiovascular Disease

## 2022-07-16 NOTE — Telephone Encounter (Signed)
*  STAT* If patient is at the pharmacy, call can be transferred to refill team.   1. Which medications need to be refilled? (please list name of each medication and dose if known) lisinopril (ZESTRIL) 2.5 MG tablet    2. Which pharmacy/location (including street and city if local pharmacy) is medication to be sent to?  [id number] - [place], [place] [facility] [address]    3. Do they need a 30 day or 90 day supply? 30 day

## 2022-07-16 NOTE — Telephone Encounter (Signed)
*  STAT* If patient is at the pharmacy, call can be transferred to refill team.   1. Which medications need to be refilled? (please list name of each medication and dose if known) Lisinopril  2. Which pharmacy/location (including street and city if local pharmacy) is medication to [facility] sent to?[facility] [place], [place]  3. Do they need a 30 day or 90 day supply? [place]

## 2022-07-16 NOTE — Telephone Encounter (Signed)
Please advise if ok to refill last filled by pcp  Date: 05/01/2021 Department: [place] [facility] Ordering/Authorizing: Doreen Beam, FNP

## 2022-07-17 ENCOUNTER — Ambulatory Visit: Payer: BC Managed Care – PPO | Admitting: Cardiovascular Disease

## 2022-07-17 NOTE — Telephone Encounter (Signed)
Please advise if ok to refill. Last filled by PCP. 

## 2022-07-18 MED ORDER — LISINOPRIL 2.5 MG PO TABS: 2.5000 mg | ORAL_TABLET | Freq: Every day | ORAL | 0 refills | Status: DC

## 2022-07-18 NOTE — Telephone Encounter (Signed)
Refill has been sent it  Gerrie Nordmann, NP  You1 hour ago (7:29 AM)    That is fine.   You  Hammock, Sheri, NPYesterday (8:08 AM)    Are you ok putting this in your name? She sees you on 4/4

## 2022-07-24 ENCOUNTER — Ambulatory Visit: Payer: BC Managed Care – PPO | Attending: Cardiology | Admitting: Cardiology

## 2022-07-24 ENCOUNTER — Encounter: Payer: Self-pay | Admitting: Cardiology

## 2022-07-24 VITALS — BP 138/78 | HR 63 | Ht 64.0 in | Wt 154.6 lb

## 2022-07-24 DIAGNOSIS — I10 Essential (primary) hypertension: Secondary | ICD-10-CM

## 2022-07-24 DIAGNOSIS — I5032 Chronic diastolic (congestive) heart failure: Secondary | ICD-10-CM

## 2022-07-24 DIAGNOSIS — E785 Hyperlipidemia, unspecified: Secondary | ICD-10-CM | POA: Diagnosis not present

## 2022-07-24 DIAGNOSIS — I2510 Atherosclerotic heart disease of native coronary artery without angina pectoris: Secondary | ICD-10-CM | POA: Diagnosis not present

## 2022-07-24 MED ORDER — LISINOPRIL 5 MG PO TABS: 5.0000 mg | ORAL_TABLET | Freq: Every day | ORAL | 1 refills | Status: DC

## 2022-07-24 NOTE — Patient Instructions (Signed)
Medication Instructions:  - Your physician has recommended you make the following change in your medication:   1) INCREASE Lisinopril to 5 mg: - take 1 tablet by mouth once daily  You may use up your current supply of lisinopril 2.5 mg:      - take 2 tablets (5 mg) once daily until gone  *If you need a refill on your cardiac medications before your next appointment, please call your pharmacy*   Lab Work: - none ordered  If you have labs (blood work) drawn today and your tests are completely normal, you will receive your results only by: [place] (if you have MyChart) OR A paper copy in the mail If you have any lab test that is abnormal or we need to change your treatment, we will call you to review the results.   Testing/Procedures: - none ordered   Follow-Up: At [facility], you and your health needs are our priority.  As part of our continuing mission to provide you with exceptional heart care, we have created designated Provider Care Teams.  These Care Teams include your primary Cardiologist (physician) and Advanced Practice Providers (APPs -  Physician Assistants and Nurse Practitioners) who all work together to provide you with the care you need, when you need it.  We recommend signing up for the patient portal called "MyChart".  Sign up information is provided on this After Visit Summary.  MyChart is used to connect with patients for Virtual Visits (Telemedicine).  Patients are able to view lab/test results, encounter notes, upcoming appointments, etc.  Non-urgent messages can be sent to your provider as well.   To learn more about what you can do with MyChart, go to [email or web address].    Your next appointment:   6 month(s)  Provider:   You may see Kathlyn [place], MD or one of the following Advanced Practice Providers on your designated Care Team:    Gerrie Nordmann, NP    Other Instructions N/a

## 2022-07-24 NOTE — Progress Notes (Signed)
Cardiology Office Note:   Date:  07/24/2022  ID:  Isabella Burch, DOB [birth year], MRN [id number]  History of Present Illness:   Isabella Burch is a 52 y.o. female with past medical history of CAD status post NSTEMI, stress-induced cardiomyopathy, HFrEF, ascending aortic dilatation, hypertension, hyperlipidemia who presents to the clinic today for follow-up.  She was respiratory in the hospital 11/2020 for an NSTEMI.  After catheter Nonobstructive CAD.  Echo showed severely decreased ventricular function with EF of 20-25% consistent with stress-induced cardiomyopathy.  Follow-up echo November 22 showed normalization of left ventricular function, mild MR, borderline dilatation of the ascending aorta 36 mm.  She was last seen in clinic 04/17/2022 by D. Wittenborn, NP denied continued episodes of chest discomfort reported as an increased DOE and fatigue for approximately 2 months.  She stated she was becoming intermittently dyspneic walking up Concern is her daughter's apartment.  She was sent for lab work and an echocardiogram was ordered for her dyspnea.  She returns to clinic today stating that overall from a cardiac standpoint she has been doing well.  She has had no further episodes of chest discomfort or shortness of breath or fatigue.  Her palpitations have improved.  Her only concern he has today is that she wakes up in the night sweating profusely and has an upcoming appointment with her primary care provider to discuss options for premenopause versus menopause.  She denies any recent hospitalizations or visits to the emergency department.  ROS: 10 point review of systems is completed and is negative with the exception of what is mentioned in the HPI  Studies Reviewed:    EKG: Sinus rhythm with a rate of 63 with left atrial enlargement, no acute change from prior studies  TTE 05/16/22 1. Left ventricular ejection fraction, by estimation, is 55 to 60%. The  left ventricle has normal function.  The left ventricle has no regional  wall motion abnormalities. Left ventricular diastolic parameters are  consistent with Grade I diastolic  dysfunction (impaired relaxation).   2. Right ventricular systolic function is normal. The right ventricular  size is normal. There is normal pulmonary artery systolic pressure. The  estimated right ventricular systolic pressure is [id number] mmHg.   3. The mitral valve is normal in structure. No evidence of mitral valve  regurgitation. No evidence of mitral stenosis.   4. The aortic valve is tricuspid. Aortic valve regurgitation is not  visualized. No aortic stenosis is present.   5. There is borderline dilatation of the ascending aorta, measuring 38  mm.   6. The inferior vena cava is normal in size with greater than 50%  respiratory variability, suggesting right atrial pressure of 3 mmHg.    Lexiscan Myoview 02/04/22 Perfusion/Defect LV perfusion is normal.  Chamber Size Left ventricular function is normal. Nuclear stress EF: 58 %. No evidence of transient ischemic dilation (TID) noted.  Stress Combined Conclusion Findings are consistent with no prior ischemia and no prior myocardial infarction. The study is low risk.    Risk Assessment/Calculations:              Physical Exam:   VS:  BP 138/78 (BP Location: Left Arm, Patient Position: Sitting, Cuff Size: Normal)   Pulse 63   Ht 5\' 4"  (1.626 m)   Wt 154 lb 9.6 oz (70.1 kg)   LMP 10/19/2020   SpO2 95%   BMI 26.54 kg/m    Wt Readings from Last 3 Encounters:  07/24/22 154 lb 9.6 oz (70.1  kg)  04/17/22 150 lb 4.8 oz (68.2 kg)  03/01/22 155 lb (70.3 kg)     GEN: Well nourished, well developed in no acute distress NECK: No JVD; No carotid bruits CARDIAC: RRR, no murmurs, rubs, gallops RESPIRATORY:  Clear to auscultation without rales, wheezing or rhonchi  ABDOMEN: Soft, non-tender, non-distended EXTREMITIES:  No edema; No deformity   ASSESSMENT AND PLAN:   Nonobstructive coronary artery  disease per left heart catheterization following NSTEMI in August 2022.  Patient recently underwent stress testing in October 2023 that was low risk scan.  She denies continued chest pain today.  She is continued on aspirin 81 mg daily, atorvastatin 40 mg daily, and Toprol-XL 12.5 mg daily.  HFimpEF/stress-induced cardiomyopathy with LVEF 55-60%, no regional wall motion abnormalities, G1 DD, no valvular abnormalities noted 04/2022.  NYHA class I.  Euvolemic on exam today.  Continued on lisinopril and Toprol XL  Essential hypertension with blood pressure today 140/80 recheck 138/78.  Patient is continued on Toprol-XL 12.5 mg daily had previously been on lisinopril 2.5 mg daily which has been increased to 5 mg daily.  She has been encouraged to continue to monitor blood pressures at home.  For elevated blood pressures will continue to escalate medications.  Hyperlipidemia with LDL in May 2023 of 62 so she is at goal she is continued on atorvastatin 40 mg daily.  Disposition patient return to clinic to see MD/APP in 6 months or sooner if needed.        Signed, Rajinder Mesick, NP

## 2022-07-28 NOTE — Progress Notes (Signed)
I,J'ya E Hunter,acting as a scribe for Jacky Kindle, FNP.,have documented all relevant documentation on the behalf of Jacky Kindle, FNP,as directed by  Jacky Kindle, FNP while in the presence of Jacky Kindle, FNP.   Complete physical exam   Patient: Isabella Burch   DOB: 05/19/1970   51 y.o. Female  MRN: [id number] Visit Date: 07/29/2022  Today's healthcare provider: Jacky Kindle, FNP   Chief Complaint  Patient presents with   Annual Exam   Subjective    Isabella Burch is a 52 y.o. female who presents today for a complete physical exam.  She reports consuming a general diet. The patient does not participate in regular exercise at present. She generally feels fairly well. She reports sleeping poorly. She does have additional problems to discuss today.   Reports "infected" ingrown that appeared yesterday, says it is a reoccurring problem. Also wants to discuss anxiety medication.   Patient is aware that she needs colon screening, mammogram, and PAP.  HPI    Past Medical History:  Diagnosis Date   Anxiety    Ascending aorta dilatation    a. 11/2020 CTA chest: 3.8cm asc Ao.   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 11/2020 Echo: EF 20-25%; b. 02/2021 Echo: EF 55-60%, no rwma.   Extensor tendon rupture, non-traumatic, hand and wrist    right   Heart murmur    History of kidney stones    Hypertension    Nonobstructive CAD (coronary artery disease)    a. 11/2020 NSTEMI/Cath: LM nl, LAD 65m, D1/2 nl, LCX min irregs, RCA nl, RPDA/RPAV nl.   Rheumatoid arthritis dx 2006   BILATERAL HANDS AND RIGHT ANKLE   Takotsubo cardiomyopathy    a. 11/2020 Echo: EF 20-25%, mid-apical ant, inf, and apical AK. Nl RV size/fxn. Mild MR; b. 11/2020 Cath: nonobs dzs; c. 02/2021 Echo: EF 55-60%, no rwma, nl RV fxn, Mild MR, Asc Ao 36mm.   Tobacco abuse    Past Surgical History:  Procedure Laterality Date   CYSTOSCOPY WITH URETEROSCOPY, STONE BASKETRY AND STENT  PLACEMENT  2011   LEFT HEART CATH AND CORONARY ANGIOGRAPHY N/A 12/10/2020   Procedure: LEFT HEART CATH AND CORONARY ANGIOGRAPHY;  Surgeon: Iran Ouch, MD;  Location: [facility];  Service: Cardiovascular;  Laterality: N/A;   LEFT WRIST EXTENSOR TENDON REPAIR     TENDON TRANSFER Right 11/09/2014   Procedure: RIGHT THUMB EIP TO EPL TENDON TRANSFER;  Surgeon: Bradly Bienenstock, MD;  Location: [facility];  Service: Orthopedics;  Laterality: Right;   TUBAL LIGATION     Social History   Socioeconomic History   Marital status: Married    Spouse name: Avery Eustice   Number of children: 2   Years of education: 12   Highest education level: Not on file  Occupational History   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    Employer: [employer]    Comment: [employer]   Occupation: Estate agent    Comment: Part-time job  Tobacco Use   Smoking status: Every Day    Types: E-cigarettes   Smokeless tobacco: Never   Tobacco comments:    0.5 ppd for much of her life but currently smoking 2-4 cigarettes/day (11/2020)  Vaping Use   Vaping Use: Every day  Substance and Sexual Activity   Alcohol use: No    Alcohol/week: 0.0 standard drinks of alcohol   Drug use: No  Sexual activity: Not on file  Other Topics Concern   Not on file  Social History Narrative   Trude grew up in [place]. She lives at home with her husband, two children and a granddaughter. They have 2 dogs (great pyreneees and dachbull).  She works at the [employer] in [employer]. She enjoys working and being around children.    Social Determinants of Health   Financial Resource Strain: Not on file  Food Insecurity: Not on file  Transportation Needs: Not on file  Physical Activity: Not on file  Stress: Not on file  Social Connections: Not on file  Intimate Partner Violence: Not on file   Family Status  Relation Name Status   Mother  Alive   Father  Deceased    Sister  Alive   Sister  Alive   Daughter  Alive   Son  Alive   Family History  Problem Relation Age of Onset   Hypothyroidism Mother    Coronary artery disease Mother    Hyperlipidemia Father    Hypertension Father    Diabetes Father    Allergies  Allergen Reactions   Enbrel [Etanercept] Swelling   Humira [Adalimumab] Other (See Comments)    kidney dysfunction    Patient Care Team: Jacky Kindle, FNP as PCP - General (Family Medicine) Iran Ouch, MD as PCP - Cardiology (Cardiology)   Medications: Outpatient Medications Prior to Visit  Medication Sig   acetaminophen (TYLENOL) 500 MG tablet Take 500 mg by mouth every 6 (six) hours as needed for moderate pain, mild pain, headache or fever.   aspirin EC 81 MG EC tablet Take 1 tablet (81 mg total) by mouth daily. Swallow whole.   atorvastatin (LIPITOR) 40 MG tablet Take 1 tablet (40 mg total) by mouth daily. Please keep scheduled appointment for future refills. Thank you.   DULoxetine (CYMBALTA) 60 MG capsule Take 1 capsule (60 mg total) by mouth daily.   fluticasone (FLONASE) 50 MCG/ACT nasal spray SPRAY 2 SPRAYS INTO EACH NOSTRIL EVERY DAY   leflunomide (ARAVA) 10 MG tablet TAKE 1 TABLET BY MOUTH ONCE A DAY   lisinopril (ZESTRIL) 5 MG tablet Take 1 tablet (5 mg total) by mouth daily.   methocarbamol (ROBAXIN) 750 MG tablet TAKE 1 TABLET (750 MG TOTAL) BY MOUTH 2 (TWO) TIMES DAILY AS NEEDED FOR MUSCLE SPASMS.   metoprolol succinate (TOPROL-XL) 25 MG 24 hr tablet Take 0.5 tablets (12.5 mg total) by mouth daily.   ondansetron (ZOFRAN ODT) 4 MG disintegrating tablet Take 1 tablet (4 mg total) by mouth every 8 (eight) hours as needed for nausea or vomiting.   ORENCIA CLICKJECT 125 MG/ML SOAJ Inject 1 Syringe into the skin once a week.   promethazine-dextromethorphan (PROMETHAZINE-DM) 6.25-15 MG/5ML syrup Take 5 mLs by mouth 4 (four) times daily as needed for cough.   tiZANidine (ZANAFLEX) 4 MG tablet Take 1 tablet (4 mg total)  by mouth at bedtime.   VITAMIN D PO Take by mouth. 4000IU   [DISCONTINUED] meclizine (ANTIVERT) 25 MG tablet Take 1 tablet (25 mg total) by mouth 3 (three) times daily as needed for dizziness. (Patient not taking: Reported on 07/24/2022)   [DISCONTINUED] meloxicam (MOBIC) 15 MG tablet TAKE 1 TABLET (15 MG TOTAL) BY MOUTH DAILY. TAKE WITH MEAL. (Patient not taking: Reported on 07/24/2022)   No facility-administered medications prior to visit.    Review of Systems  Psychiatric/Behavioral:  Positive for agitation, confusion and sleep disturbance. The patient is nervous/anxious.  Irritability     Objective    BP (!) 142/94 (BP Location: Left Arm, Patient Position: Sitting, Cuff Size: Large)   Pulse 69   Temp 98.6 F (37 C) (Oral)   Ht 5\' 4"  (1.626 m)   Wt 153 lb 8 oz (69.6 kg)   LMP 10/19/2020   SpO2 98%   BMI 26.35 kg/m   Physical Exam Vitals and nursing note reviewed.  Constitutional:      General: She is awake. She is not in acute distress.    Appearance: Normal appearance. She is well-developed, well-groomed and overweight. She is not ill-appearing, toxic-appearing or diaphoretic.  HENT:     Head: Normocephalic and atraumatic.     Jaw: There is normal jaw occlusion. No trismus, tenderness, swelling or pain on movement.     Right Ear: Hearing, tympanic membrane, ear canal and external ear normal. There is no impacted cerumen.     Left Ear: Hearing, tympanic membrane, ear canal and external ear normal. There is no impacted cerumen.     Nose: Nose normal. No congestion or rhinorrhea.     Right Turbinates: Not enlarged, swollen or pale.     Left Turbinates: Not enlarged, swollen or pale.     Right Sinus: No maxillary sinus tenderness or frontal sinus tenderness.     Left Sinus: No maxillary sinus tenderness or frontal sinus tenderness.     Mouth/Throat:     Lips: Pink.     Mouth: Mucous membranes are moist. No injury.     Tongue: No lesions.     Pharynx: Oropharynx is  clear. Uvula midline. No pharyngeal swelling, oropharyngeal exudate, posterior oropharyngeal erythema or uvula swelling.     Tonsils: No tonsillar exudate or tonsillar abscesses.  Eyes:     General: Lids are normal. Lids are everted, no foreign bodies appreciated. Vision grossly intact. Gaze aligned appropriately. No allergic shiner or visual field deficit.       Right eye: No discharge.        Left eye: No discharge.     Extraocular Movements: Extraocular movements intact.     Conjunctiva/sclera: Conjunctivae normal.     Right eye: Right conjunctiva is not injected. No exudate.    Left eye: Left conjunctiva is not injected. No exudate.    Pupils: Pupils are equal, round, and reactive to light.  Neck:     Thyroid: No thyroid mass, thyromegaly or thyroid tenderness.     Vascular: No carotid bruit.     Trachea: Trachea normal.  Cardiovascular:     Rate and Rhythm: Normal rate and regular rhythm.     Pulses: Normal pulses.          Carotid pulses are 2+ on the right side and 2+ on the left side.      Radial pulses are 2+ on the right side and 2+ on the left side.       Dorsalis pedis pulses are 2+ on the right side and 2+ on the left side.       Posterior tibial pulses are 2+ on the right side and 2+ on the left side.     Heart sounds: Normal heart sounds, S1 normal and S2 normal. No murmur heard.    No friction rub. No gallop.  Pulmonary:     Effort: Pulmonary effort is normal. No respiratory distress.     Breath sounds: Normal breath sounds and air entry. No stridor. No wheezing, rhonchi or rales.  Chest:  Chest wall: No tenderness.  Abdominal:     General: Abdomen is flat. Bowel sounds are normal. There is no distension.     Palpations: Abdomen is soft. There is no mass.     Tenderness: There is no abdominal tenderness. There is no right CVA tenderness, left CVA tenderness, guarding or rebound.     Hernia: No hernia is present.  Genitourinary:    Comments: Exam deferred; denies  complaints Musculoskeletal:        General: No swelling, tenderness, deformity or signs of injury. Normal range of motion.     Cervical back: Full passive range of motion without pain, normal range of motion and neck supple. No edema, rigidity or tenderness. No muscular tenderness.     Right lower leg: No edema.     Left lower leg: No edema.  Lymphadenopathy:     Cervical: No cervical adenopathy.     Right cervical: No superficial, deep or posterior cervical adenopathy.    Left cervical: No superficial, deep or posterior cervical adenopathy.  Skin:    General: Skin is warm and dry.     Capillary Refill: Capillary refill takes less than 2 seconds.     Coloration: Skin is not jaundiced or pale.     Findings: No bruising, erythema, lesion or rash.  Neurological:     General: No focal deficit present.     Mental Status: She is alert and oriented to person, place, and time. Mental status is at baseline.     GCS: GCS eye subscore is 4. GCS verbal subscore is 5. GCS motor subscore is 6.     Sensory: Sensation is intact. No sensory deficit.     Motor: Motor function is intact. No weakness.     Coordination: Coordination is intact. Coordination normal.     Gait: Gait is intact. Gait normal.  Psychiatric:        Attention and Perception: Attention and perception normal.        Mood and Affect: Mood and affect normal.        Speech: Speech normal.        Behavior: Behavior normal. Behavior is cooperative.        Thought Content: Thought content normal.        Cognition and Memory: Cognition and memory normal.        Judgment: Judgment normal.      Last depression screening scores    07/29/2022    3:48 PM 09/10/2021    2:13 PM 05/01/2021   10:40 AM  PHQ 2/9 Scores  PHQ - 2 Score 2 1 4   PHQ- 9 Score 11 4 20    Last fall risk screening    07/29/2022    3:48 PM  Fall Risk   Falls in the past year? 0  Number falls in past yr: 0  Injury with Fall? 0  Risk for fall due to : No Fall Risks    Last Audit-C alcohol use screening    07/29/2022    3:49 PM  Alcohol Use Disorder Test (AUDIT)  1. How often do you have a drink containing alcohol? 0  3. How often do you have six or more drinks on one occasion? 0   A score of 3 or more in women, and 4 or more in men indicates increased risk for alcohol abuse, EXCEPT if all of the points are from question 1   Results for orders placed or performed in visit on 07/29/22  CBC with  Differential/Platelet  Result Value Ref Range   WBC 6.4 3.4 - 10.8 x10E3/uL   RBC 4.09 3.77 - 5.28 x10E6/uL   Hemoglobin 12.7 11.1 - 15.9 g/dL   Hematocrit 16.1 09.6 - 46.6 %   MCV 94 79 - 97 fL   MCH 31.1 26.6 - 33.0 pg   MCHC 33.1 31.5 - 35.7 g/dL   RDW 04.5 40.9 - 81.1 %   Platelets 369 150 - 450 x10E3/uL   Neutrophils 54 Not Estab. %   Lymphs 27 Not Estab. %   Monocytes 12 Not Estab. %   Eos 6 Not Estab. %   Basos 1 Not Estab. %   Neutrophils Absolute 3.5 1.4 - 7.0 x10E3/uL   Lymphocytes Absolute 1.7 0.7 - 3.1 x10E3/uL   Monocytes Absolute 0.8 0.1 - 0.9 x10E3/uL   EOS (ABSOLUTE) 0.4 0.0 - 0.4 x10E3/uL   Basophils Absolute 0.1 0.0 - 0.2 x10E3/uL   Immature Granulocytes 0 Not Estab. %   Immature Grans (Abs) 0.0 0.0 - 0.1 x10E3/uL  Comprehensive Metabolic Panel (CMET)  Result Value Ref Range   Glucose 107 (H) 70 - 99 mg/dL   BUN 11 6 - 24 mg/dL   Creatinine, Ser 9.14 0.57 - 1.00 mg/dL   eGFR 90 >78 GN/FAO/1.30   BUN/Creatinine Ratio 14 9 - 23   Sodium 142 134 - 144 mmol/L   Potassium 4.8 3.5 - 5.2 mmol/L   Chloride 103 96 - 106 mmol/L   CO2 26 20 - 29 mmol/L   Calcium 9.5 8.7 - 10.2 mg/dL   Total Protein 6.3 6.0 - 8.5 g/dL   Albumin 4.2 3.9 - 4.9 g/dL   Globulin, Total 2.1 1.5 - 4.5 g/dL   Albumin/Globulin Ratio 2.0 1.2 - 2.2   Bilirubin Total 0.3 0.0 - 1.2 mg/dL   Alkaline Phosphatase 118 44 - 121 IU/L   AST 35 0 - 40 IU/L   ALT 46 (H) 0 - 32 IU/L  Hemoglobin A1c  Result Value Ref Range   Hgb A1c MFr Bld 5.8 (H) 4.8 - 5.6 %   Est.  average glucose Bld gHb Est-mCnc 120 mg/dL  TSH + free T4  Result Value Ref Range   TSH 1.240 0.450 - 4.500 uIU/mL   Free T4 0.89 0.82 - 1.77 ng/dL  FSH/LH  Result Value Ref Range   LH 62.7 mIU/mL   FSH 110.0 mIU/mL  Lipid panel  Result Value Ref Range   Cholesterol, Total 155 100 - 199 mg/dL   Triglycerides 865 0 - 149 mg/dL   HDL 56 >78 mg/dL   VLDL Cholesterol Cal 19 5 - 40 mg/dL   LDL Chol Calc (NIH) 80 0 - 99 mg/dL   Chol/HDL Ratio 2.8 0.0 - 4.4 ratio  Vitamin D (25 hydroxy)  Result Value Ref Range   Vit D, 25-Hydroxy 115.0 (H) 30.0 - 100.0 ng/mL    Assessment & Plan    Routine Health Maintenance and Physical Exam  Exercise Activities and Dietary recommendations  Goals      Quit Smoking        Immunization History  Administered Date(s) Administered   Tdap 09/10/2021    Health Maintenance  Topic Date Due   COVID-19 Vaccine (1) Never done   Zoster Vaccines- Shingrix (1 of 2) Never done   COLONOSCOPY (Pts 45-80yrs Insurance coverage will need to be confirmed)  Never done   MAMMOGRAM  Never done   PAP SMEAR-Modifier  05/09/2022   INFLUENZA VACCINE  11/20/2022  DTaP/Tdap/Td (2 - Td or Tdap) 09/11/2031   Hepatitis C Screening  Completed   HIV Screening  Completed   HPV VACCINES  Aged Out    Discussed health benefits of physical activity, and encouraged her to engage in regular exercise appropriate for her age and condition.  Problem List Items Addressed This Visit       Cardiovascular and Mediastinum   Acquired dilation of ascending aorta and aortic root    Noted following CM; continues to be monitored by cardiology      Chronic HFrEF (heart failure with reduced ejection fraction)    Chronic, with partial recovered EF following previous stress induced cardiomyopathy Followed by cards Discussed titration of lisinopril vs metop change to coreg to assist in meeting BP goal Encourage change to lisinopril from 5 to 10 mg with plan to f/u with cards in 1  month to discuss change in BB Goal <129/<79      Relevant Orders   CBC with Differential/Platelet (Completed)   Comprehensive Metabolic Panel (CMET) (Completed)     Musculoskeletal and Integument   Rheumatoid arthritis    Chronic, followed by rheum Patient notes some dietary/gi concerns following new medication start        Other   Annual physical exam - Primary    Things to do to keep yourself healthy  - Exercise at least 30-45 minutes a day, 3-4 days a week.  - Eat a low-fat diet with lots of fruits and vegetables, up to 7-9 servings per day.  - Seatbelts can save your life. Wear them always.  - Smoke detectors on every level of your home, check batteries every year.  - Eye Doctor - have an eye exam every 1-2 years  - Safe sex - if you may be exposed to STDs, use a condom.  - Alcohol -  If you drink, do it moderately, less than 2 drinks per day.  - Health Care Power of Attorney. Choose someone to speak for you if you are not able.  - Depression is common in our stressful world.If you're feeling down or losing interest in things you normally enjoy, please come in for a visit.  - Violence - If anyone is threatening or hurting you, please call immediately.       Relevant Orders   CBC with Differential/Platelet (Completed)   Comprehensive Metabolic Panel (CMET) (Completed)   Hemoglobin A1c (Completed)   TSH + free T4 (Completed)   FSH/LH (Completed)   Lipid panel (Completed)   Depression, recurrent    Chronic, recurrent, moderate depression Continues on cymbalta 60 mg at this time; declines dose change and/or modification of therapy Continue to monitor       Dyslipidemia, goal LDL below 70    Repeat LP Previously on lipitor 40 mg LDL goal of <70 given know CHF      Relevant Orders   Lipid panel (Completed)   Prediabetes    Goal is to prevent transition to T2DM Repeat A1c Continue to recommend balanced, lower carb meals. Smaller meal size, adding snacks. Choosing  water as drink of choice and increasing purposeful exercise.       Relevant Orders   Hemoglobin A1c (Completed)   Screening for colon cancer    Referral placed for colon cancer screening; patient without known risk factors- age 52      Relevant Orders   Ambulatory referral to Gastroenterology   Screening mammogram for breast cancer    Due for screening for mammogram, denies  breast concerns, provided with phone number to call and schedule appointment for mammogram. Encouraged to repeat breast cancer screening every 1-2 years.       Relevant Orders   MM 3D SCREENING MAMMOGRAM BILATERAL BREAST   Vitamin D deficiency    Chronic, previously on supplementation Repeat labs iso of chronic, recurrent depression given previous association with mood distorder      Relevant Orders   Vitamin D (25 hydroxy) (Completed)   Return in about 6 months (around 01/28/2023) for chonic disease management.    Leilani Merl, FNP, have reviewed all documentation for this visit. The documentation on 08/01/22 for the exam, diagnosis, procedures, and orders are all accurate and complete.  Jacky Kindle, FNP  [facility] [phone] (phone) [phone] (fax)  [facility]

## 2022-07-29 ENCOUNTER — Ambulatory Visit (INDEPENDENT_AMBULATORY_CARE_PROVIDER_SITE_OTHER): Payer: BC Managed Care – PPO | Admitting: Family Medicine

## 2022-07-29 ENCOUNTER — Encounter: Payer: Self-pay | Admitting: Family Medicine

## 2022-07-29 VITALS — BP 142/94 | HR 69 | Temp 98.6°F | Ht 64.0 in | Wt 153.5 lb

## 2022-07-29 DIAGNOSIS — E559 Vitamin D deficiency, unspecified: Secondary | ICD-10-CM | POA: Diagnosis not present

## 2022-07-29 DIAGNOSIS — F339 Major depressive disorder, recurrent, unspecified: Secondary | ICD-10-CM

## 2022-07-29 DIAGNOSIS — I5022 Chronic systolic (congestive) heart failure: Secondary | ICD-10-CM | POA: Diagnosis not present

## 2022-07-29 DIAGNOSIS — I77819 Aortic ectasia, unspecified site: Secondary | ICD-10-CM

## 2022-07-29 DIAGNOSIS — R7303 Prediabetes: Secondary | ICD-10-CM

## 2022-07-29 DIAGNOSIS — Z1211 Encounter for screening for malignant neoplasm of colon: Secondary | ICD-10-CM | POA: Insufficient documentation

## 2022-07-29 DIAGNOSIS — E785 Hyperlipidemia, unspecified: Secondary | ICD-10-CM

## 2022-07-29 DIAGNOSIS — Z1231 Encounter for screening mammogram for malignant neoplasm of breast: Secondary | ICD-10-CM

## 2022-07-29 DIAGNOSIS — M069 Rheumatoid arthritis, unspecified: Secondary | ICD-10-CM

## 2022-07-29 DIAGNOSIS — Z0000 Encounter for general adult medical examination without abnormal findings: Secondary | ICD-10-CM

## 2022-07-29 MED ORDER — SULFAMETHOXAZOLE-TRIMETHOPRIM 800-160 MG PO TABS: 1.0000 | ORAL_TABLET | Freq: Two times a day (BID) | ORAL | 0 refills | Status: DC

## 2022-07-29 NOTE — Patient Instructions (Signed)
Please call and schedule your mammogram:  [facility]  [address], [address] [facility] [place],  [place]  [address] Get Driving Directions Main: [phone]  Sunday:Closed Monday:7:20 AM - 5:00 PM Tuesday:7:20 AM - 5:00 PM Wednesday:7:20 AM - 5:00 PM Thursday:7:20 AM - 5:00 PM Friday:7:20 AM - 4:30 PM Saturday:Closed  The CDC recommends two doses of Shingrix (the new shingles vaccine) separated by 2 to 6 months for adults age 50 years and older. I recommend checking with your insurance plan regarding coverage for this vaccine.    

## 2022-07-30 ENCOUNTER — Other Ambulatory Visit: Payer: Self-pay | Admitting: Family Medicine

## 2022-07-30 ENCOUNTER — Encounter: Payer: Self-pay | Admitting: Family Medicine

## 2022-07-30 LAB — COMPREHENSIVE METABOLIC PANEL
ALT: 46 IU/L — ABNORMAL HIGH (ref 0–32)
AST: 35 IU/L (ref 0–40)
Albumin/Globulin Ratio: 2 (ref 1.2–2.2)
Albumin: 4.2 g/dL (ref 3.9–4.9)
Alkaline Phosphatase: 118 IU/L (ref 44–121)
BUN/Creatinine Ratio: 14 (ref 9–23)
BUN: 11 mg/dL (ref 6–24)
Bilirubin Total: 0.3 mg/dL (ref 0.0–1.2)
CO2: 26 mmol/L (ref 20–29)
Calcium: 9.5 mg/dL (ref 8.7–10.2)
Chloride: 103 mmol/L (ref 96–106)
Creatinine, Ser: 0.8 mg/dL (ref 0.57–1.00)
Globulin, Total: 2.1 g/dL (ref 1.5–4.5)
Glucose: 107 mg/dL — ABNORMAL HIGH (ref 70–99)
Potassium: 4.8 mmol/L (ref 3.5–5.2)
Sodium: 142 mmol/L (ref 134–144)
Total Protein: 6.3 g/dL (ref 6.0–8.5)
eGFR: 90 mL/min/{1.73_m2} (ref >59)

## 2022-07-30 LAB — CBC WITH DIFFERENTIAL/PLATELET
Basophils Absolute: 0.1 10*3/uL (ref 0.0–0.2)
Basos: 1 %
EOS (ABSOLUTE): 0.4 10*3/uL (ref 0.0–0.4)
Eos: 6 %
Hematocrit: 38.4 % (ref 34.0–46.6)
Hemoglobin: 12.7 g/dL (ref 11.1–15.9)
Immature Grans (Abs): 0 10*3/uL (ref 0.0–0.1)
Immature Granulocytes: 0 %
Lymphocytes Absolute: 1.7 10*3/uL (ref 0.7–3.1)
Lymphs: 27 %
MCH: 31.1 pg (ref 26.6–33.0)
MCHC: 33.1 g/dL (ref 31.5–35.7)
MCV: 94 fL (ref 79–97)
Monocytes Absolute: 0.8 10*3/uL (ref 0.1–0.9)
Monocytes: 12 %
Neutrophils Absolute: 3.5 10*3/uL (ref 1.4–7.0)
Neutrophils: 54 %
Platelets: 369 10*3/uL (ref 150–450)
RBC: 4.09 x10E6/uL (ref 3.77–5.28)
RDW: 12.7 % (ref 11.7–15.4)
WBC: 6.4 10*3/uL (ref 3.4–10.8)

## 2022-07-30 LAB — LIPID PANEL
Chol/HDL Ratio: 2.8 ratio (ref 0.0–4.4)
Cholesterol, Total: 155 mg/dL (ref 100–199)
HDL: 56 mg/dL (ref >39)
LDL Chol Calc (NIH): 80 mg/dL (ref 0–99)
Triglycerides: 108 mg/dL (ref 0–149)
VLDL Cholesterol Cal: 19 mg/dL (ref 5–40)

## 2022-07-30 LAB — FSH/LH
FSH: 110 m[IU]/mL
LH: 62.7 m[IU]/mL

## 2022-07-30 LAB — VITAMIN D 25 HYDROXY (VIT D DEFICIENCY, FRACTURES): Vit D, 25-Hydroxy: 115 ng/mL — ABNORMAL HIGH (ref 30.0–100.0)

## 2022-07-30 LAB — TSH+FREE T4
Free T4: 0.89 ng/dL (ref 0.82–1.77)
TSH: 1.24 u[IU]/mL (ref 0.450–4.500)

## 2022-07-30 LAB — HEMOGLOBIN A1C
Est. average glucose Bld gHb Est-mCnc: 120 mg/dL
Hgb A1c MFr Bld: 5.8 % — ABNORMAL HIGH (ref 4.8–5.6)

## 2022-07-30 NOTE — Progress Notes (Signed)
Hi Isabella Burch,  Thank you for allowing me to continue caring for you; your labs are summarized below: - stable blood chemistry; borderline return of ALT elevation. Continue to monitor use of NSAIDs and/or alcohol if applicable. Repeat labs in 3-6 months.  - A1c continues to remain stable in pre-diabetic range; Continue to recommend balanced, lower carb meals. Smaller meal size, adding snacks. Choosing water as drink of choice and increasing purposeful exercise.  - Vit Judi Cong is currently over corrected; please stop any Vit D supplementation at this time. We can repeat labs in 3-6 months to ensure stabilization.   - LDL is slightly increased from 62 to 80; I continue to recommend diet low in saturated fat and regular exercise - 30 min at least 5 times per week. LDL goal of <70 to best assist with further disease progression.  - Per conversation with your cardiology NP, Ms Hammock, please increase lisinopril to 10 mg daily and plan to follow up in 1 month to discuss changing from once daily long lasting Toprol to Coreg to further assist both blood pressure and heart rate/palpitations.  - normal/stable thyroid  - labs confirm post-menopausal state. Continue to monitor symptoms.  - Referrals pending for both mammogram and colonoscopy; please call and schedule your mammogram and let us know if you do not hear from GI.  Jacky Kindle, FNP  [facility] [address] [place], [place] [address] [phone] (phone) [phone] (fax) [facility]

## 2022-08-01 ENCOUNTER — Encounter: Payer: Self-pay | Admitting: Family Medicine

## 2022-08-01 DIAGNOSIS — Z0000 Encounter for general adult medical examination without abnormal findings: Secondary | ICD-10-CM | POA: Insufficient documentation

## 2022-08-01 DIAGNOSIS — I77819 Aortic ectasia, unspecified site: Secondary | ICD-10-CM | POA: Insufficient documentation

## 2022-08-01 DIAGNOSIS — R7303 Prediabetes: Secondary | ICD-10-CM | POA: Insufficient documentation

## 2022-08-01 NOTE — Assessment & Plan Note (Signed)
Referral placed for colon cancer screening; patient without known risk factors- age 52

## 2022-08-01 NOTE — Assessment & Plan Note (Signed)
Chronic, with partial recovered EF following previous stress induced cardiomyopathy Followed by cards Discussed titration of lisinopril vs metop change to coreg to assist in meeting BP goal Encourage change to lisinopril from 5 to 10 mg with plan to f/u with cards in 1 month to discuss change in BB Goal <129/<79

## 2022-08-01 NOTE — Assessment & Plan Note (Signed)

## 2022-08-01 NOTE — Assessment & Plan Note (Signed)
Noted following CM; continues to be monitored by cardiology

## 2022-08-01 NOTE — Assessment & Plan Note (Signed)
Chronic, recurrent, moderate depression Continues on cymbalta 60 mg at this time; declines dose change and/or modification of therapy Continue to monitor

## 2022-08-01 NOTE — Assessment & Plan Note (Signed)
Due for screening for mammogram, denies breast concerns, provided with phone number to call and schedule appointment for mammogram. Encouraged to repeat breast cancer screening every 1-2 years.  

## 2022-08-01 NOTE — Assessment & Plan Note (Signed)
Goal is to prevent transition to T2DM Repeat A1c Continue to recommend balanced, lower carb meals. Smaller meal size, adding snacks. Choosing water as drink of choice and increasing purposeful exercise.

## 2022-08-01 NOTE — Assessment & Plan Note (Signed)
Chronic, previously on supplementation Repeat labs iso of chronic, recurrent depression given previous association with mood distorder

## 2022-08-01 NOTE — Assessment & Plan Note (Signed)
Repeat LP Previously on lipitor 40 mg LDL goal of <70 given know CHF

## 2022-08-01 NOTE — Assessment & Plan Note (Signed)
Chronic, followed by rheum Patient notes some dietary/gi concerns following new medication start

## 2022-08-03 ENCOUNTER — Other Ambulatory Visit: Payer: Self-pay | Admitting: Family Medicine

## 2022-08-03 DIAGNOSIS — N951 Menopausal and female climacteric states: Secondary | ICD-10-CM

## 2022-08-04 ENCOUNTER — Other Ambulatory Visit: Payer: Self-pay | Admitting: Family Medicine

## 2022-08-04 MED ORDER — VEOZAH 45 MG PO TABS: 1.0000 | ORAL_TABLET | Freq: Every day | ORAL | 2 refills | Status: DC

## 2022-08-05 NOTE — Telephone Encounter (Signed)
Requested medication (s) are due for refill today: no  Requested medication (s) are on the active medication list: yes    Last refill: 08/04/22  #30  2 refills  Future visit scheduled no  Notes to clinic: Note from pharmacy:    Pharmacy comment: Alternative Requested:NOT COVERED $650.    Requested Prescriptions  Pending Prescriptions Disp Refills   VEOZAH 45 MG TABS [Pharmacy Med Name: VEOZAH 45 MG TABLET] 30 tablet 2    Sig: TAKE 1 TABLET BY MOUTH EVERY DAY     Off-Protocol Failed - 08/04/2022  5:03 PM      Failed - Medication not assigned to a protocol, review manually.      Passed - Valid encounter within last 12 months    Recent Outpatient Visits           1 week ago Annual physical exam   [facility] Merita Norton T, FNP   10 months ago Acute pain of right shoulder   [facility] Merita Norton T, FNP   10 months ago Chronic HFrEF (heart failure with reduced ejection fraction) [facility])   [facility] Merita Norton T, FNP   2 years ago Hypertension, unspecified type   [facility] Flinchum, Eula Fried, FNP   2 years ago Anxiety   [facility] Flinchum, Eula Fried, FNP       Future Appointments             In 5 months Hammock, Lavonna Rua, NP [facility]

## 2022-08-06 ENCOUNTER — Other Ambulatory Visit: Payer: Self-pay | Admitting: Family Medicine

## 2022-08-06 ENCOUNTER — Encounter: Payer: Self-pay | Admitting: *Deleted

## 2022-08-06 MED ORDER — ESTRADIOL 0.05 MG/24HR TD PTTW: 1.0000 | MEDICATED_PATCH | TRANSDERMAL | 12 refills | Status: DC

## 2022-08-14 ENCOUNTER — Encounter: Payer: Self-pay | Admitting: Family Medicine

## 2022-08-14 ENCOUNTER — Other Ambulatory Visit: Payer: Self-pay | Admitting: Family Medicine

## 2022-08-14 DIAGNOSIS — M25511 Pain in right shoulder: Secondary | ICD-10-CM

## 2022-08-22 ENCOUNTER — Other Ambulatory Visit: Payer: Self-pay | Admitting: Family Medicine

## 2022-08-22 DIAGNOSIS — F339 Major depressive disorder, recurrent, unspecified: Secondary | ICD-10-CM

## 2022-08-22 NOTE — Telephone Encounter (Signed)
Requested Prescriptions  Pending Prescriptions Disp Refills   DULoxetine (CYMBALTA) 60 MG capsule [Pharmacy Med Name: DULOXETINE HCL DR 60 MG CAP] 90 capsule 0    Sig: TAKE 1 CAPSULE BY MOUTH EVERY DAY     Psychiatry: Antidepressants - SNRI - duloxetine Failed - 08/22/2022  1:59 AM      Failed - Last BP in normal range    BP Readings from Last 1 Encounters:  07/29/22 (!) 142/94         Passed - Cr in normal range and within 360 days    Creatinine, Ser  Date Value Ref Range Status  07/29/2022 0.80 0.57 - 1.00 mg/dL Final         Passed - eGFR is 30 or above and within 360 days    GFR calc Af Amer  Date Value Ref Range Status  02/07/2020 105 >59 mL/min/1.73 Final    Comment:    **In accordance with recommendations from the NKF-ASN Task force,**   [facility] is in the process of updating its eGFR calculation to the   2021 CKD-EPI creatinine equation that estimates kidney function   without a race variable.    GFR, Estimated  Date Value Ref Range Status  04/17/2022 >60 >60 mL/min Final    Comment:    (NOTE) Calculated using the CKD-EPI Creatinine Equation (2021)    GFR  Date Value Ref Range Status  11/21/2015 85.37 >60.00 mL/min Final   eGFR  Date Value Ref Range Status  07/29/2022 90 >59 mL/min/1.73 Final         Passed - Completed PHQ-2 or PHQ-9 in the last 360 days      Passed - Valid encounter within last 6 months    Recent Outpatient Visits           3 weeks ago Annual physical exam   [facility] Merita Norton T, FNP   11 months ago Acute pain of right shoulder   [facility] Merita Norton T, FNP   11 months ago Chronic HFrEF (heart failure with reduced ejection fraction) [facility])   [place] [facility] Merita Norton T, FNP   2 years ago Hypertension, unspecified type   [place] [facility] Flinchum, Eula Fried, FNP   2 years ago Anxiety   [place] [facility]  Flinchum, Eula Fried, FNP       Future Appointments             In 5 months Hammock, Lavonna Rua, NP [facility] at [facility]

## 2022-09-04 ENCOUNTER — Other Ambulatory Visit: Payer: Self-pay | Admitting: Family Medicine

## 2022-09-04 DIAGNOSIS — I5022 Chronic systolic (congestive) heart failure: Secondary | ICD-10-CM

## 2022-09-04 NOTE — Telephone Encounter (Signed)
Requested Prescriptions  Pending Prescriptions Disp Refills   metoprolol succinate (TOPROL-XL) 25 MG 24 hr tablet [Pharmacy Med Name: METOPROLOL SUCC ER 25 MG TAB] 45 tablet 0    Sig: TAKE 1/2 TABLET BY MOUTH EVERY DAY     Cardiovascular:  Beta Blockers Failed - 09/04/2022  2:26 AM      Failed - Last BP in normal range    BP Readings from Last 1 Encounters:  07/29/22 (!) 142/94         Passed - Last Heart Rate in normal range    Pulse Readings from Last 1 Encounters:  07/29/22 69         Passed - Valid encounter within last 6 months    Recent Outpatient Visits           1 month ago Annual physical exam   [facility] Merita Norton T, FNP   11 months ago Acute pain of right shoulder   [facility] Merita Norton T, FNP   11 months ago Chronic HFrEF (heart failure with reduced ejection fraction) [facility])   [place] [facility] Merita Norton T, FNP   2 years ago Hypertension, unspecified type   [facility] Flinchum, Eula Fried, FNP   2 years ago Anxiety   [place] [facility] Flinchum, Eula Fried, FNP       Future Appointments             In 4 months Hammock, Lavonna Rua, NP [place] [facility] at [facility]

## 2022-09-05 ENCOUNTER — Telehealth: Payer: Self-pay | Admitting: Family Medicine

## 2022-09-05 NOTE — Telephone Encounter (Signed)
LVM informing patient that her Health Certificate was completed and a copy would be left up front for pick up.

## 2022-10-05 ENCOUNTER — Other Ambulatory Visit: Payer: Self-pay | Admitting: Nurse Practitioner

## 2022-11-21 ENCOUNTER — Other Ambulatory Visit: Payer: Self-pay | Admitting: Family Medicine

## 2022-11-21 DIAGNOSIS — F339 Major depressive disorder, recurrent, unspecified: Secondary | ICD-10-CM

## 2022-11-21 NOTE — Telephone Encounter (Signed)
Requested Prescriptions  Pending Prescriptions Disp Refills   DULoxetine (CYMBALTA) 60 MG capsule [Pharmacy Med Name: DULOXETINE HCL DR 60 MG CAP] 90 capsule 1    Sig: TAKE 1 CAPSULE BY MOUTH EVERY DAY     Psychiatry: Antidepressants - SNRI - duloxetine Failed - 11/21/2022  2:33 AM      Failed - Last BP in normal range    BP Readings from Last 1 Encounters:  07/29/22 (!) 142/94         Passed - Cr in normal range and within 360 days    Creatinine, Ser  Date Value Ref Range Status  07/29/2022 0.80 0.57 - 1.00 mg/dL Final         Passed - eGFR is 30 or above and within 360 days    GFR calc Af Amer  Date Value Ref Range Status  02/07/2020 105 >59 mL/min/1.73 Final    Comment:    **In accordance with recommendations from the NKF-ASN Task force,**   [facility] is in the process of updating its eGFR calculation to the   2021 CKD-EPI creatinine equation that estimates kidney function   without a race variable.    GFR, Estimated  Date Value Ref Range Status  04/17/2022 >60 >60 mL/min Final    Comment:    (NOTE) Calculated using the CKD-EPI Creatinine Equation (2021)    GFR  Date Value Ref Range Status  11/21/2015 85.37 >60.00 mL/min Final   eGFR  Date Value Ref Range Status  07/29/2022 90 >59 mL/min/1.73 Final         Passed - Completed PHQ-2 or PHQ-9 in the last 360 days      Passed - Valid encounter within last 6 months    Recent Outpatient Visits           3 months ago Annual physical exam   [place] [facility] Merita Norton T, FNP   1 year ago Acute pain of right shoulder   [place] [facility] Merita Norton T, FNP   1 year ago Chronic HFrEF (heart failure with reduced ejection fraction) [facility])   [place] [facility] Merita Norton T, FNP   2 years ago Hypertension, unspecified type   [place] [facility] Flinchum, Eula Fried, FNP   2 years ago Anxiety   [place] [facility]  Flinchum, Eula Fried, FNP       Future Appointments             In 2 months Hammock, Lavonna Rua, NP [place] [facility] at [facility]

## 2022-12-03 ENCOUNTER — Other Ambulatory Visit: Payer: Self-pay | Admitting: Cardiology

## 2022-12-07 ENCOUNTER — Other Ambulatory Visit: Payer: Self-pay | Admitting: Family Medicine

## 2022-12-07 DIAGNOSIS — I5022 Chronic systolic (congestive) heart failure: Secondary | ICD-10-CM

## 2023-01-13 ENCOUNTER — Other Ambulatory Visit: Payer: Self-pay | Admitting: Nurse Practitioner

## 2023-01-20 ENCOUNTER — Encounter: Payer: Self-pay | Admitting: Cardiology

## 2023-01-20 ENCOUNTER — Ambulatory Visit: Payer: BC Managed Care – PPO | Attending: Cardiology | Admitting: Cardiology

## 2023-01-20 VITALS — BP 140/88 | HR 63 | Ht 66.0 in | Wt 147.4 lb

## 2023-01-20 DIAGNOSIS — I5032 Chronic diastolic (congestive) heart failure: Secondary | ICD-10-CM | POA: Diagnosis not present

## 2023-01-20 DIAGNOSIS — I2510 Atherosclerotic heart disease of native coronary artery without angina pectoris: Secondary | ICD-10-CM | POA: Diagnosis not present

## 2023-01-20 DIAGNOSIS — I10 Essential (primary) hypertension: Secondary | ICD-10-CM | POA: Diagnosis not present

## 2023-01-20 DIAGNOSIS — E785 Hyperlipidemia, unspecified: Secondary | ICD-10-CM | POA: Diagnosis not present

## 2023-01-20 DIAGNOSIS — I5181 Takotsubo syndrome: Secondary | ICD-10-CM

## 2023-01-20 MED ORDER — LISINOPRIL 10 MG PO TABS: 10.0000 mg | ORAL_TABLET | Freq: Every day | ORAL | Status: DC

## 2023-01-20 NOTE — Patient Instructions (Signed)
Medication Instructions:  Your physician has recommended you make the following change in your medication:  INCREASE - lisinopril (ZESTRIL) 10 mg total by mouth daily  *If you need a refill on your cardiac medications before your next appointment, please call your pharmacy*  Lab Work: Your provider would like for you to get labs in 2 weeks to have the following labs drawn: BMET.  If you have labs (blood work) drawn today and your tests are completely normal, you will receive your results only by: MyChart Message (if you have MyChart) OR A paper copy in the mail If you have any lab test that is abnormal or we need to change your treatment, we will call you to review the results.  Testing/Procedures: -None ordered  Follow-Up: At [facility], you and your health needs are our priority.  As part of our continuing mission to provide you with exceptional heart care, we have created designated Provider Care Teams.  These Care Teams include your primary Cardiologist (physician) and Advanced Practice Providers (APPs -  Physician Assistants and Nurse Practitioners) who all work together to provide you with the care you need, when you need it.  Your next appointment:   6 - 8  week(s)  Provider:   Charlsie Quest, NP    Other Instructions -None

## 2023-01-20 NOTE — Progress Notes (Signed)
Cardiology Office Note:  .   Date:  01/20/2023  ID:  Isabella Burch, DOB Apr 17, 1971, MRN [id number] PCP: Jacky Kindle, FNP  [facility] Providers Cardiologist:  Lorine Bears, MD    History of Present Illness: .   Isabella Burch is a 52 y.o. female with a past medical history of CAD status post NSTEMI, stress-induced cardiomyopathy, HFrEF, ascending aortic dilatation, hypertension, hyperlipidemia, who presents today for follow-up for coronary artery disease.  Was evaluated in the hospital in 11/2020 for NSTEMI.  Left heart catheterization showed mild nonobstructive coronary artery disease.  Echocardiogram which showed severely decreased ventricular function with an EF of 20 to 25% consistent with stress-induced cardiomyopathy.  Follow-up echocardiogram in November 2022 showed normalized left ventricular function.  Lexiscan Myoview completed 02/04/2022 was considered a low risk study.  Repeat echocardiogram in 05/16/2022 revealed an LVEF of 55 to 60%, no RWMA, G1 DD, and no valvular abnormalities were noted.  She was last seen in clinic 07/24/22 at that time she was doing well overall from a cardiac perspective.  She had no further episodes of chest discomfort shortness of breath or fatigue.  She stated her palpitations and also improved.  The only concern that she had had when she wakes up in the night sweating profusely.  Lisinopril was increased to 5 mg daily for better control of her blood pressure but there were no further testing that was needed at that time.  She returns to clinic today stating from the cardiac standpoint she has been doing well. She denies any chest pain, shortness of breath, or peripheral edema. Palpitations have improved. She did however suffer a concussion after having boxes that fell on her head and neck while she was sitting at a desk at work. She was evaluated by urgent care. She has noted a increase in her blood pressure. Has been compliant with her medications.  Denies any recent hospitalizations or visits to the emergency department.   ROS: 10 point review of system has been reviewed and considered negative with exception of what is been listed in the HPI  Studies Reviewed: Marland Kitchen   EKG Interpretation Date/Time:  Tuesday January 20 2023 15:19:12 EDT Ventricular Rate:  63 PR Interval:  144 QRS Duration:  76 QT Interval:  414 QTC Calculation: 423 R Axis:   21  Text Interpretation: Normal sinus rhythm Possible Left atrial enlargement Septal infarct (cited on or before 17-May-2015) When compared with ECG of 09-Dec-2020 13:25, Confirmed by Charlsie Quest ([id number]) on 01/20/2023 3:21:15 PM    TTE 05/16/22 1. Left ventricular ejection fraction, by estimation, is 55 to 60%. The  left ventricle has normal function. The left ventricle has no regional  wall motion abnormalities. Left ventricular diastolic parameters are  consistent with Grade I diastolic  dysfunction (impaired relaxation).   2. Right ventricular systolic function is normal. The right ventricular  size is normal. There is normal pulmonary artery systolic pressure. The  estimated right ventricular systolic pressure is 18.4 mmHg.   3. The mitral valve is normal in structure. No evidence of mitral valve  regurgitation. No evidence of mitral stenosis.   4. The aortic valve is tricuspid. Aortic valve regurgitation is not  visualized. No aortic stenosis is present.   5. There is borderline dilatation of the ascending aorta, measuring 38  mm.   6. The inferior vena cava is normal in size with greater than 50%  respiratory variability, suggesting right atrial pressure of 3 mmHg.  Lexiscan Myoview 02/04/22     Perfusion/Defect LV perfusion is normal.  Chamber Size Left ventricular function is normal. Nuclear stress EF: 58 %. No evidence of transient ischemic dilation (TID) noted.  Stress Combined Conclusion Findings are consistent with no prior ischemia and no prior myocardial infarction. The  study is low risk.   Risk Assessment/Calculations:     HYPERTENSION CONTROL Vitals:   01/20/23 1511 01/20/23 1530  BP: (!) 140/90 (!) 140/88    The patient's blood pressure is elevated above target today.  In order to address the patient's elevated BP:           Physical Exam:   VS:  BP (!) 140/88 (BP Location: Left Arm, Patient Position: Sitting, Cuff Size: Normal)   Pulse 63   Ht 5\' 6"  (1.676 m)   Wt 147 lb 6.4 oz (66.9 kg)   LMP 10/19/2020   SpO2 98%   BMI 23.79 kg/m    Wt Readings from Last 3 Encounters:  01/20/23 147 lb 6.4 oz (66.9 kg)  07/29/22 153 lb 8 oz (69.6 kg)  07/24/22 154 lb 9.6 oz (70.1 kg)    GEN: Well nourished, well developed in no acute distress NECK: No JVD; No carotid bruits CARDIAC: RRR, no murmurs, rubs, gallops RESPIRATORY:  Clear to auscultation without rales, wheezing or rhonchi  ABDOMEN: Soft, non-tender, non-distended EXTREMITIES:  No edema; No deformity   ASSESSMENT AND PLAN: .   Hypertension with blood pressure 140/90 and 140/88. Previously had her lisinopril increased from 2.5 mg to 5 mg daily. Today we are increasing her lisinopril to 10 mg daily where she is going to take 5 mg in the am and 5 mg in the pm. She will continue on her current Toprol XL dosing of 12.5 mg daily. She has been encouraged to monitor her blood pressure 1-2 hours post medication and keep a log to bring with her on her return appointment. She will have a repeat bmp in 2 weeks to evaluate kidney function and potassium after medication dose change.  She continues to have elevated blood pressures with changes in lisinopril dosing on return appointment we can discontinue Toprol-XL changed to carvedilol for more blood pressure control.  Nonobstructive coronary artery disease s/p NSTEMI with stress induced CMP. She denies angina or anginal equivalent.  Previous stress testing completed in October 2023 that was considered low risk scan.  She is continued on aspirin 81 mg daily,  atorvastatin 40 mg daily, Toprol-XL 12.5 mg daily.  No further ischemic testing is warranted at this time.  KG today reveals sinus rhythm with left atrial enlargement with a rate of 63 with no ischemic changes noted.  HFpEF/stress-induced cardiomyopathy.  EF 55 to 60%, no RWMA, G1 DD, and no valvular abnormalities noted 04/2022.  She is euvolemic on exam not requiring diuretic therapy.  NYHA class I symptoms.  Continued on lisinopril and Toprol-XL.  Mixed hyperlipidemia with an LDL of 80 in April 2024.  She is continued on atorvastatin 40 mg daily.  Goal of less than 70.  Encouraged dietary modifications.  She has an upcoming blood work scheduled for repeat.  If needed can increase atorvastatin or add ezetimibe 10 mg daily to get to goal.       Dispo: Patient to return to clinic to see MD/APP in 6-8 weeks or sooner if needed to reevaluate medication changes and blood pressure   Signed, Erica Osuna, NP

## 2023-01-27 ENCOUNTER — Other Ambulatory Visit: Payer: Self-pay

## 2023-01-27 DIAGNOSIS — I5022 Chronic systolic (congestive) heart failure: Secondary | ICD-10-CM

## 2023-01-27 MED ORDER — LISINOPRIL 20 MG PO TABS: 20.0000 mg | ORAL_TABLET | Freq: Every day | ORAL | 3 refills | Status: DC

## 2023-01-27 NOTE — Telephone Encounter (Signed)
Lets increase the lisinopril to 20 mg daily. She will likely need a new prescription. Also will need a BMP in 1 week.

## 2023-02-20 ENCOUNTER — Emergency Department: Payer: BC Managed Care – PPO

## 2023-02-20 ENCOUNTER — Telehealth: Payer: Self-pay | Admitting: Cardiovascular Disease

## 2023-02-20 ENCOUNTER — Other Ambulatory Visit: Payer: Self-pay

## 2023-02-20 DIAGNOSIS — Z5321 Procedure and treatment not carried out due to patient leaving prior to being seen by health care provider: Secondary | ICD-10-CM | POA: Insufficient documentation

## 2023-02-20 DIAGNOSIS — I10 Essential (primary) hypertension: Secondary | ICD-10-CM | POA: Insufficient documentation

## 2023-02-20 LAB — BASIC METABOLIC PANEL
Anion gap: 7 (ref 5–15)
BUN: 10 mg/dL (ref 6–20)
CO2: 27 mmol/L (ref 22–32)
Calcium: 8.5 mg/dL — ABNORMAL LOW (ref 8.9–10.3)
Chloride: 107 mmol/L (ref 98–111)
Creatinine, Ser: 0.85 mg/dL (ref 0.44–1.00)
GFR, Estimated: >60 mL/min (ref >60)
Glucose, Bld: 119 mg/dL — ABNORMAL HIGH (ref 70–99)
Potassium: 3.4 mmol/L — ABNORMAL LOW (ref 3.5–5.1)
Sodium: 141 mmol/L (ref 135–145)

## 2023-02-20 LAB — TROPONIN I (HIGH SENSITIVITY): Troponin I (High Sensitivity): 4 ng/L (ref <18)

## 2023-02-20 LAB — CBC
HCT: 39.1 % (ref 36.0–46.0)
Hemoglobin: 13.2 g/dL (ref 12.0–15.0)
MCH: 32.4 pg (ref 26.0–34.0)
MCHC: 33.8 g/dL (ref 30.0–36.0)
MCV: 96.1 fL (ref 80.0–100.0)
Platelets: 300 10*3/uL (ref 150–400)
RBC: 4.07 MIL/uL (ref 3.87–5.11)
RDW: 12 % (ref 11.5–15.5)
WBC: 4.9 10*3/uL (ref 4.0–10.5)
nRBC: 0 % (ref 0.0–0.2)

## 2023-02-20 NOTE — Telephone Encounter (Signed)
Spoke with patient and she stated that for "a couple weeks now" her blood pressure has been slowly creeping up even with medication changes. Last office visit 01/20/23 increased her lisinopril to 10 mg daily where she is going to take 5 mg in the am and 5 mg in the pm. She will continue on her current Toprol XL dosing of 12.5 mg daily. Patient denies chest pain, shortness of breath, numbness/tingling, nausea/vomiting, fever, chills and uncontrolled pain.   You need to be examined within the next 24 hours. A clinic or an urgent care center is often a good source of care if  you can't get an appointment.  Patient has 6 - 8 week follow-up appointment scheduled 03/05/23 but asked if there was a sooner appointment available as her blood pressure is not under control currently. Appointment made for 02/25/23.   * Untreated high blood pressure may cause damage to your heart, brain, kidneys, and eyes. * Treatment of high blood pressure can reduce the risk of stroke, heart attack, and heart failure. * The goal of blood pressure treatment depends on your age and if you have other health problems. A general goal is less than 130/80. But you should talk to your doctor about a goal that is right for you.  CALL BACK IF: * Weakness or numbness of the face, arm or leg on one side of the body occurs * Difficulty walking, difficulty talking, or severe headache occurs * Chest pain or difficulty breathing occurs * You become worse

## 2023-02-20 NOTE — ED Notes (Signed)
Pt reports she takes for BP Lisinopril 20mg  every day, Metoprolol, Atorvastatin, ASA qd

## 2023-02-20 NOTE — Telephone Encounter (Signed)
Pt c/o BP issue: STAT if pt c/o blurred vision, one-sided weakness or slurred speech  1. What are your last 5 BP readings? 185/105, 180's/100's for the past month   2. Are you having any other symptoms (ex. Dizziness, headache, blurred vision, passed out)? Blurred Vision   3. What is your BP issue? Pt states she has been having high blood pressure and sometimes she will have light headiness and blurred vision. Please advise

## 2023-02-20 NOTE — ED Triage Notes (Signed)
Pt presents from home with complaints of high blood pressure. Pt reports at home she woke up from a nap and took her BP which at the time was 186/115. Pt called her cardiologist who recommended for her to come to ER. Pt reports has been drinking a lot of Pedialyte. Pt  reports history of MI. Pt talks in complete sentences no respiratory distress noted

## 2023-02-21 ENCOUNTER — Emergency Department
Admission: EM | Admit: 2023-02-21 | Discharge: 2023-02-21 | Discharge status: Against Medical Advice | Payer: BC Managed Care – PPO | Attending: Emergency Medicine | Admitting: Emergency Medicine

## 2023-02-23 ENCOUNTER — Telehealth: Payer: Self-pay | Admitting: Nurse Practitioner

## 2023-02-23 NOTE — Telephone Encounter (Signed)
   Pt recently seen in ED on 11/1, due to elevated BP.  Labs relatively stable (mild hypokalemia).  She has been taking lisinopril 20 mg daily and toprol xl 12.5 mg daily.  Has f/u appt on 11/6.  BP today still 150's over 1-teens.  I rec that she change lisinopril to 20 BID and I'll see her in clinic on Wednesday to re-eval and adjust meds as necessary.  Caller verbalized understanding and was grateful for the call back.  Lab Results  Component Value Date   NA 141 02/20/2023   CL 107 02/20/2023   K 3.4 (L) 02/20/2023   CO2 27 02/20/2023   BUN 10 02/20/2023   CREATININE 0.85 02/20/2023   GFRNONAA >60 02/20/2023   CALCIUM 8.5 (L) 02/20/2023   ALBUMIN 4.2 07/29/2022   GLUCOSE 119 (H) 02/20/2023   Lab Results  Component Value Date   WBC 4.9 02/20/2023   HGB 13.2 02/20/2023   HCT 39.1 02/20/2023   MCV 96.1 02/20/2023   PLT 300 02/20/2023     Nicolasa Ducking, NP 02/23/2023, 8:23 PM

## 2023-02-25 ENCOUNTER — Encounter: Payer: Self-pay | Admitting: Nurse Practitioner

## 2023-02-25 ENCOUNTER — Ambulatory Visit: Payer: BC Managed Care – PPO | Attending: Nurse Practitioner | Admitting: Nurse Practitioner

## 2023-02-25 VITALS — BP 130/92 | HR 73 | Ht 66.0 in | Wt 146.2 lb

## 2023-02-25 DIAGNOSIS — I5032 Chronic diastolic (congestive) heart failure: Secondary | ICD-10-CM

## 2023-02-25 DIAGNOSIS — I10 Essential (primary) hypertension: Secondary | ICD-10-CM | POA: Diagnosis not present

## 2023-02-25 DIAGNOSIS — I5181 Takotsubo syndrome: Secondary | ICD-10-CM

## 2023-02-25 DIAGNOSIS — I2510 Atherosclerotic heart disease of native coronary artery without angina pectoris: Secondary | ICD-10-CM

## 2023-02-25 DIAGNOSIS — E785 Hyperlipidemia, unspecified: Secondary | ICD-10-CM | POA: Diagnosis not present

## 2023-02-25 MED ORDER — LISINOPRIL 40 MG PO TABS: 40.0000 mg | ORAL_TABLET | Freq: Every day | ORAL | 1 refills | Status: DC

## 2023-02-25 MED ORDER — CARVEDILOL 3.125 MG PO TABS: 3.1250 mg | ORAL_TABLET | Freq: Two times a day (BID) | ORAL | 0 refills | Status: DC

## 2023-02-25 NOTE — Progress Notes (Signed)
Office Visit    Patient Name: Isabella Burch Date of Encounter: 02/25/2023  Primary Care Provider:  Jacky Kindle, FNP Primary Cardiologist:  Lorine Bears, MD  Chief Complaint    52 y.o. female w/a  h/o NSTEMI & non-obstructive CAD (11/2020), takotsubo cardiomyopathy, HFimpEF, HTN, HL, remote tob abuse, RA, and anxiety, who presents for f/u due to HTN.  Past Medical History  Subjective   Past Medical History:  Diagnosis Date   Anxiety    Ascending aorta dilatation (HCC)    a. 11/2020 CTA chest: 3.8cm asc Ao.   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 11/2020 Echo: EF 20-25%; b. 02/2021 Echo: EF 55-60%, no rwma; c. 04/2022 Echo: EF 55-60%, GrI DD, nl RV fxn, and no significant valvular dzs.   Extensor tendon rupture, non-traumatic, hand and wrist    right   Heart murmur    History of kidney stones    Hypertension    Nonobstructive CAD (coronary artery disease)    a. 11/2020 NSTEMI/Cath: LM nl, LAD 61m, D1/2 nl, LCX min irregs, RCA nl, RPDA/RPAV nl.   Rheumatoid arthritis (HCC) dx 2006   BILATERAL HANDS AND RIGHT ANKLE   Takotsubo cardiomyopathy    a. 11/2020 Echo: EF 20-25%, mid-apical ant, inf, and apical AK. Nl RV size/fxn. Mild MR; b. 11/2020 Cath: nonobs dzs; c. 02/2021 Echo: EF 55-60%; c. 04/2022 Echo: EF 55-60%.   Tobacco abuse    Past Surgical History:  Procedure Laterality Date   CYSTOSCOPY WITH URETEROSCOPY, STONE BASKETRY AND STENT PLACEMENT  2011   LEFT HEART CATH AND CORONARY ANGIOGRAPHY N/A 12/10/2020   Procedure: LEFT HEART CATH AND CORONARY ANGIOGRAPHY;  Surgeon: Iran Ouch, MD;  Location: [facility] INVASIVE CV LAB;  Service: Cardiovascular;  Laterality: N/A;   LEFT WRIST EXTENSOR TENDON REPAIR     TENDON TRANSFER Right 11/09/2014   Procedure: RIGHT THUMB EIP TO EPL TENDON TRANSFER;  Surgeon: Bradly Bienenstock, MD;  Location: [facility];  Service: Orthopedics;  Laterality: Right;   TUBAL LIGATION       Allergies  Allergies  Allergen Reactions   Enbrel [Etanercept] Swelling   Humira [Adalimumab] Other (See Comments)    kidney dysfunction      History of Present Illness      52 y.o. y/o female w/ the above PMH including non-obstructive CAD, takotsubo cardiomyopathy, HFimpEF, HTN, HL, anxiety, RA, and remote tob abuse.  In 11/2020, she was admitted to [facility] w/ chest pain.  ECG showed prior anteroseptal infarct and 1mm ST elevation in V1 & V2, w/ more subtle ST elevation in V3-V6.  CTA of chest was neg for PE. HsTrop rose to 3284.  Echo showed an EF of 20-25% with mid-apical anterior, inferior, and apical akinesis consistent with a stress-induced cardiomyopathy. Cath revealed mild non-obstructive dzs, and she was medically managed.  F/u echo in 02/2021 showed improvement in EF to 55-60% w/ mild MR.  In the setting of DOE, repeat echo in 04/2022, which showed EF of 55-60%, GrI DD, nl RV fxn, and no significant valvular dzs.    At office f/u in 01/2023, she was feeling well, but was hypertensive.  Lisinopril was increased to 10 mg daily, and was subsequently increased to 20 mg daily due to ongoing elevations in BP.  On 11/4, she contacted [place] after hours due to elevated BPs, which prompted an ED visit.  Lisinopril was increased to 20 mg BID.  Despite this, pressures  have remained elevated.  She admits to increased stress at work and anxiety.  When her blood pressure is high, she will check it multiple times throughout the day to see if it comes down, which she admits only adds to her anxiety.  She does not experience chest pain or dyspnea, and denies palpitations, PND, orthopnea, dizziness, syncope, edema, or early satiety.  She has occasionally noted neck discomfort which is typically fleeting and of unclear etiology.  Objective  Home Medications    Current Outpatient Medications  Medication Sig Dispense Refill   acetaminophen (TYLENOL) 500 MG tablet Take 500 mg by mouth every 6 (six) hours as  needed for moderate pain, mild pain, headache or fever.     aspirin EC 81 MG EC tablet Take 1 tablet (81 mg total) by mouth daily. Swallow whole. 30 tablet 11   atorvastatin (LIPITOR) 40 MG tablet TAKE 1 TABLET BY MOUTH EVERY DAY 90 tablet 0   DULoxetine (CYMBALTA) 60 MG capsule TAKE 1 CAPSULE BY MOUTH EVERY DAY 90 capsule 1   estradiol (VIVELLE-DOT) 0.05 MG/24HR patch Place 1 patch (0.05 mg total) onto the skin 2 (two) times a week. 8 patch 12   fluticasone (FLONASE) 50 MCG/ACT nasal spray SPRAY 2 SPRAYS INTO EACH NOSTRIL EVERY DAY 16 mL 1   hydroxychloroquine (PLAQUENIL) 200 MG tablet Take one tablet TWICE daily weekdays. Take one tablet ONCE daily weekends. Take with food.     leflunomide (ARAVA) 10 MG tablet TAKE 1 TABLET BY MOUTH ONCE A DAY     lisinopril (ZESTRIL) 20 MG tablet Take 1 tablet (20 mg total) by mouth daily. 90 tablet 3   methocarbamol (ROBAXIN) 750 MG tablet TAKE 1 TABLET (750 MG TOTAL) BY MOUTH 2 (TWO) TIMES DAILY AS NEEDED FOR MUSCLE SPASMS. 60 tablet 0   metoprolol succinate (TOPROL-XL) 25 MG 24 hr tablet TAKE 1/2 TABLET BY MOUTH DAILY 45 tablet 0   ondansetron (ZOFRAN ODT) 4 MG disintegrating tablet Take 1 tablet (4 mg total) by mouth every 8 (eight) hours as needed for nausea or vomiting. 20 tablet 0   ORENCIA CLICKJECT 125 MG/ML SOAJ Inject 1 Syringe into the skin once a week.     Fezolinetant (VEOZAH) 45 MG TABS Take 1 tablet (45 mg total) by mouth daily. (Patient not taking: Reported on 01/20/2023) 30 tablet 2   promethazine-dextromethorphan (PROMETHAZINE-DM) 6.25-15 MG/5ML syrup Take 5 mLs by mouth 4 (four) times daily as needed for cough. (Patient not taking: Reported on 01/20/2023) 118 mL 0   sulfamethoxazole-trimethoprim (BACTRIM DS) 800-160 MG tablet Take 1 tablet by mouth 2 (two) times daily. (Patient not taking: Reported on 01/20/2023) 14 tablet 0   tiZANidine (ZANAFLEX) 4 MG tablet Take 1 tablet (4 mg total) by mouth at bedtime. (Patient not taking: Reported on  01/20/2023) 30 tablet 0   VITAMIN D PO Take by mouth. 4000IU (Patient not taking: Reported on 01/20/2023)     No current facility-administered medications for this visit.     Physical Exam    VS:  BP (!) 148/92 (BP Location: Left Arm, Patient Position: Sitting, Cuff Size: Normal)   Pulse 73   Ht 5\' 6"  (1.676 m)   Wt 146 lb 3.2 oz (66.3 kg)   LMP 10/19/2020   SpO2 98%   BMI 23.60 kg/m  , BMI Body mass index is 23.6 kg/m.    Vitals:   02/25/23 1121 02/25/23 1152  BP: (!) 148/92 (!) 130/92  Pulse: 73   SpO2: 98%  GEN: Well nourished, well developed, in no acute distress. HEENT: normal. Neck: Supple, no JVD, carotid bruits, or masses. Cardiac: RRR, no murmurs, rubs, or gallops. No clubbing, cyanosis, edema.  Radials 2+/PT 2+ and equal bilaterally.  Respiratory:  Respirations regular and unlabored, clear to auscultation bilaterally. GI: Soft, nontender, nondistended, BS + x 4. MS: no deformity or atrophy. Skin: warm and dry, no rash. Neuro:  Strength and sensation are intact. Psych: Normal affect.  Accessory Clinical Findings    Lab Results  Component Value Date   WBC 4.9 02/20/2023   HGB 13.2 02/20/2023   HCT 39.1 02/20/2023   MCV 96.1 02/20/2023   PLT 300 02/20/2023   Lab Results  Component Value Date   CREATININE 0.85 02/20/2023   BUN 10 02/20/2023   NA 141 02/20/2023   K 3.4 (L) 02/20/2023   CL 107 02/20/2023   CO2 27 02/20/2023   Lab Results  Component Value Date   ALT 46 (H) 07/29/2022   AST 35 07/29/2022   ALKPHOS 118 07/29/2022   BILITOT 0.3 07/29/2022   Lab Results  Component Value Date   CHOL 155 07/29/2022   HDL 56 07/29/2022   LDLCALC 80 07/29/2022   TRIG 108 07/29/2022   CHOLHDL 2.8 07/29/2022    Lab Results  Component Value Date   HGBA1C 5.8 (H) 07/29/2022   Lab Results  Component Value Date   TSH 1.240 07/29/2022       Assessment & Plan    1.  Primary hypertension: Pressure elevated at October 2024 visit prompting  titration of lisinopril.  She is now taking 20 mg twice daily.  Despite this, pressures continue to be elevated and even prompted an ER visit earlier this month.  I am consolidating her lisinopril to 40 mg daily and will discontinue metoprolol in favor of carvedilol 3.125 mg twice daily.  We discussed that it would be reasonable for her to check her blood pressure no more than twice a day, as she admits that checking more frequently contributes to her anxiety.  I have asked her to follow-up.  4 weeks and bring her cuff at that time.  2.  Takotsubo cardiomyopathy/chronic heart failure with improved ejection fraction: EF 55 to 60% with grade 1 diastolic dysfunction by echo in January 2024.  She is euvolemic on exam without dyspnea or edema.  Switching metoprolol to carvedilol.  Continue lisinopril.  3.  Nonobstructive CAD: No chest pain.  She remains on aspirin, statin, and beta-blocker therapy.  4.  Hyperlipidemia: LDL of 80 in April 2024 in the setting of atorvastatin 40 mg daily.  She has been watching her diet more closely and limiting meats and processed foods.  If on next lipid check, LDL remains greater than 70, will increase atorvastatin to 80 mg daily and/or add ezetimibe 10 mg daily.  5.  Disposition: Follow-up in 4-6 weeks or sooner if necessary.  Nicolasa Ducking, NP 02/25/2023, 11:30 AM

## 2023-02-25 NOTE — Patient Instructions (Signed)
Medication Instructions:  STOP the Metoprolol  START Carvedilol 3.125 mg twice daily  *If you need a refill on your cardiac medications before your next appointment, please call your pharmacy*   Lab Work: None ordered If you have labs (blood work) drawn today and your tests are completely normal, you will receive your results only by: MyChart Message (if you have MyChart) OR A paper copy in the mail If you have any lab test that is abnormal or we need to change your treatment, we will call you to review the results.   Testing/Procedures: None ordered   Follow-Up: At [facility], you and your health needs are our priority.  As part of our continuing mission to provide you with exceptional heart care, we have created designated Provider Care Teams.  These Care Teams include your primary Cardiologist (physician) and Advanced Practice Providers (APPs -  Physician Assistants and Nurse Practitioners) who all work together to provide you with the care you need, when you need it.  We recommend signing up for the patient portal called "MyChart".  Sign up information is provided on this After Visit Summary.  MyChart is used to connect with patients for Virtual Visits (Telemedicine).  Patients are able to view lab/test results, encounter notes, upcoming appointments, etc.  Non-urgent messages can be sent to your provider as well.   To learn more about what you can do with MyChart, go to [email or web address].    Your next appointment:   1 month(s)  Provider:   Nicolasa Ducking, NP    Other Instructions Bring your blood pressure cuff to your next appointment

## 2023-03-04 ENCOUNTER — Other Ambulatory Visit: Payer: Self-pay | Admitting: Cardiology

## 2023-03-05 ENCOUNTER — Ambulatory Visit: Payer: BC Managed Care – PPO | Admitting: Cardiology

## 2023-03-06 ENCOUNTER — Other Ambulatory Visit: Payer: Self-pay | Admitting: Family Medicine

## 2023-03-06 DIAGNOSIS — I5022 Chronic systolic (congestive) heart failure: Secondary | ICD-10-CM

## 2023-03-06 NOTE — Telephone Encounter (Signed)
Unable to refill per protocol, Rx expired. Discontinued 02/25/23.  Requested Prescriptions  Pending Prescriptions Disp Refills   metoprolol succinate (TOPROL-XL) 25 MG 24 hr tablet [Pharmacy Med Name: METOPROLOL SUCC ER 25 MG TAB] 45 tablet 0    Sig: TAKE 1/2 TABLET BY MOUTH DAILY     Cardiovascular:  Beta Blockers Failed - 03/06/2023  2:43 AM      Failed - Last BP in normal range    BP Readings from Last 1 Encounters:  02/25/23 (!) 130/92         Failed - Valid encounter within last 6 months    Recent Outpatient Visits           7 months ago Annual physical exam   [facility] Merita Norton T, FNP   1 year ago Acute pain of right shoulder   [place] [facility] Merita Norton T, FNP   1 year ago Chronic HFrEF (heart failure with reduced ejection fraction) [facility])   [place] [facility] Merita Norton T, FNP   2 years ago Hypertension, unspecified type   [place] [facility] Flinchum, Eula Fried, FNP   3 years ago Anxiety   [place] [facility] Flinchum, Eula Fried, FNP       Future Appointments             In 3 months Brion Aliment, Dois Davenport, NP [facility] - Last Heart Rate in normal range    Pulse Readings from Last 1 Encounters:  02/25/23 73

## 2023-04-15 ENCOUNTER — Other Ambulatory Visit: Payer: Self-pay | Admitting: Nurse Practitioner

## 2023-05-20 ENCOUNTER — Other Ambulatory Visit: Payer: Self-pay | Admitting: Nurse Practitioner

## 2023-05-27 ENCOUNTER — Telehealth: Payer: Self-pay | Admitting: Family Medicine

## 2023-05-27 NOTE — Telephone Encounter (Signed)
 [facility] pharmacy faxed refill request for the following medications:   DULoxetine  (CYMBALTA ) 60 MG capsule   Please advise

## 2023-05-28 ENCOUNTER — Other Ambulatory Visit: Payer: Self-pay | Admitting: Family Medicine

## 2023-05-28 DIAGNOSIS — F339 Major depressive disorder, recurrent, unspecified: Secondary | ICD-10-CM

## 2023-05-28 MED ORDER — DULOXETINE HCL 60 MG PO CPEP: 60.0000 mg | ORAL_CAPSULE | Freq: Every day | ORAL | 0 refills | Status: DC

## 2023-05-28 NOTE — Telephone Encounter (Signed)
 Cymbalta  60mg  refills sent. Will evaluate patient as scheduled for upcoming appt

## 2023-06-04 ENCOUNTER — Encounter: Payer: Self-pay | Admitting: *Deleted

## 2023-06-04 ENCOUNTER — Ambulatory Visit: Payer: 59 | Attending: Nurse Practitioner | Admitting: Nurse Practitioner

## 2023-06-04 ENCOUNTER — Encounter: Payer: Self-pay | Admitting: Nurse Practitioner

## 2023-06-04 VITALS — BP 120/80 | HR 69 | Ht 66.0 in | Wt 149.2 lb

## 2023-06-04 DIAGNOSIS — R002 Palpitations: Secondary | ICD-10-CM

## 2023-06-04 DIAGNOSIS — I5032 Chronic diastolic (congestive) heart failure: Secondary | ICD-10-CM | POA: Diagnosis not present

## 2023-06-04 DIAGNOSIS — I5181 Takotsubo syndrome: Secondary | ICD-10-CM | POA: Diagnosis not present

## 2023-06-04 DIAGNOSIS — Z789 Other specified health status: Secondary | ICD-10-CM

## 2023-06-04 DIAGNOSIS — I2510 Atherosclerotic heart disease of native coronary artery without angina pectoris: Secondary | ICD-10-CM | POA: Diagnosis not present

## 2023-06-04 DIAGNOSIS — E785 Hyperlipidemia, unspecified: Secondary | ICD-10-CM

## 2023-06-04 DIAGNOSIS — I10 Essential (primary) hypertension: Secondary | ICD-10-CM

## 2023-06-04 NOTE — Progress Notes (Signed)
Office Visit    Patient Name: Isabella Burch Date of Encounter: 06/04/2023  Primary Care Provider:  Jacky Kindle, FNP Primary Cardiologist:  Lorine Bears, MD  Chief Complaint    53 y.o. female h/o NSTEMI & non-obstructive CAD (11/2020), takotsubo cardiomyopathy, HFimpEF, HTN, HL, remote tob abuse, RA, and anxiety, who presents for f/u due to HTN.   Past Medical History  Subjective   Past Medical History:  Diagnosis Date   Anxiety    Ascending aorta dilatation (HCC)    a. 11/2020 CTA chest: 3.8cm asc Ao.   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 11/2020 Echo: EF 20-25%; b. 02/2021 Echo: EF 55-60%, no rwma; c. 04/2022 Echo: EF 55-60%, GrI DD, nl RV fxn, and no significant valvular dzs.   Extensor tendon rupture, non-traumatic, hand and wrist    right   Heart murmur    History of kidney stones    Hypertension    Nonobstructive CAD (coronary artery disease)    a. 11/2020 NSTEMI/Cath: LM nl, LAD 88m, D1/2 nl, LCX min irregs, RCA nl, RPDA/RPAV nl.   Rheumatoid arthritis (HCC) dx 2006   BILATERAL HANDS AND RIGHT ANKLE   Takotsubo cardiomyopathy    a. 11/2020 Echo: EF 20-25%, mid-apical ant, inf, and apical AK. Nl RV size/fxn. Mild MR; b. 11/2020 Cath: nonobs dzs; c. 02/2021 Echo: EF 55-60%; c. 04/2022 Echo: EF 55-60%.   Tobacco abuse    Past Surgical History:  Procedure Laterality Date   CYSTOSCOPY WITH URETEROSCOPY, STONE BASKETRY AND STENT PLACEMENT  2011   LEFT HEART CATH AND CORONARY ANGIOGRAPHY N/A 12/10/2020   Procedure: LEFT HEART CATH AND CORONARY ANGIOGRAPHY;  Surgeon: Iran Ouch, MD;  Location: [facility] INVASIVE CV LAB;  Service: Cardiovascular;  Laterality: N/A;   LEFT WRIST EXTENSOR TENDON REPAIR     TENDON TRANSFER Right 11/09/2014   Procedure: RIGHT THUMB EIP TO EPL TENDON TRANSFER;  Surgeon: Bradly Bienenstock, MD;  Location: [facility];  Service: Orthopedics;  Laterality: Right;   TUBAL LIGATION       Allergies  Allergies  Allergen Reactions   Adalimumab Other (See Comments)    kidney dysfunction   Etanercept Swelling and Other (See Comments)      History of Present Illness      53 y.o. y/o female w/ the above PMH including non-obstructive CAD, takotsubo cardiomyopathy, HFimpEF, HTN, HL, anxiety, RA, and remote tob abuse.  In 11/2020, she was admitted to [facility] w/ chest pain.  ECG showed prior anteroseptal infarct and 1mm ST elevation in V1 & V2, w/ more subtle ST elevation in V3-V6.  CTA of chest was neg for PE. HsTrop rose to 3284.  Echo showed an EF of 20-25% with mid-apical anterior, inferior, and apical akinesis consistent with a stress-induced cardiomyopathy. Cath revealed mild non-obstructive dzs, and she was medically managed.  F/u echo in 02/2021 showed improvement in EF to 55-60% w/ mild MR.  In the setting of DOE, repeat echo in 04/2022 was performed and showed EF of 55-60%, GrI DD, nl RV fxn, and no significant valvular dzs.     In the fall 2024, in the setting of elevated blood pressures, she required titration of lisinopril therapy initially from 5 mg daily to 20 mg twice daily in the span of a month.  She was transition to lisinopril 40 mg daily and metoprolol was discontinued in favor of carvedilol 3.125 mg twice daily.  Since her last visit,  Ms. Browder has done well.  She occasionally notes a brief skip of her heart or palpitation, lasting a second or so, and resolving spontaneously.  This typically happens when things are fairly quiet in the evening.  She has not had any sustained elevations in heart rates or prolonged periods of palpitations.  She thinks that in the past 2 months she may have had 4 skipped heartbeats.  She works full-time for the [employer] system and is very busy at work.  After work, she would use an e-cigarette but notes that 1 e-cigarette will last for 3 weeks.  She says it helps her to relax.  She denies chest pain, dyspnea, PND, orthopnea,  dizziness, syncope, edema, or early satiety. Objective  Home Medications    Current Outpatient Medications  Medication Sig Dispense Refill   acetaminophen (TYLENOL) 500 MG tablet Take 500 mg by mouth every 6 (six) hours as needed for moderate pain, mild pain, headache or fever.     aspirin EC 81 MG EC tablet Take 1 tablet (81 mg total) by mouth daily. Swallow whole. 30 tablet 11   carvedilol (COREG) 3.125 MG tablet TAKE 1 TABLET BY MOUTH 2 TIMES DAILY. -- STOP METOPROLOL 180 tablet 0   DULoxetine (CYMBALTA) 60 MG capsule Take 1 capsule (60 mg total) by mouth daily. 90 capsule 0   estradiol (VIVELLE-DOT) 0.05 MG/24HR patch Place 1 patch (0.05 mg total) onto the skin 2 (two) times a week. 8 patch 12   fluticasone (FLONASE) 50 MCG/ACT nasal spray SPRAY 2 SPRAYS INTO EACH NOSTRIL EVERY DAY 16 mL 1   hydroxychloroquine (PLAQUENIL) 200 MG tablet Take one tablet TWICE daily weekdays. Take one tablet ONCE daily weekends. Take with food.     leflunomide (ARAVA) 10 MG tablet TAKE 1 TABLET BY MOUTH ONCE A DAY     lisinopril (ZESTRIL) 40 MG tablet Take 1 tablet (40 mg total) by mouth daily. 90 tablet 1   ORENCIA CLICKJECT 125 MG/ML SOAJ Inject 1 Syringe into the skin once a week.     atorvastatin (LIPITOR) 40 MG tablet TAKE 1 TABLET BY MOUTH EVERY DAY 90 tablet 0   Fezolinetant (VEOZAH) 45 MG TABS Take 1 tablet (45 mg total) by mouth daily. (Patient not taking: Reported on 06/04/2023) 30 tablet 2   methocarbamol (ROBAXIN) 750 MG tablet TAKE 1 TABLET (750 MG TOTAL) BY MOUTH 2 (TWO) TIMES DAILY AS NEEDED FOR MUSCLE SPASMS. (Patient not taking: Reported on 06/04/2023) 60 tablet 0   ondansetron (ZOFRAN ODT) 4 MG disintegrating tablet Take 1 tablet (4 mg total) by mouth every 8 (eight) hours as needed for nausea or vomiting. (Patient not taking: Reported on 06/04/2023) 20 tablet 0   promethazine-dextromethorphan (PROMETHAZINE-DM) 6.25-15 MG/5ML syrup Take 5 mLs by mouth 4 (four) times daily as needed for cough.  (Patient not taking: Reported on 06/04/2023) 118 mL 0   sulfamethoxazole-trimethoprim (BACTRIM DS) 800-160 MG tablet Take 1 tablet by mouth 2 (two) times daily. (Patient not taking: Reported on 06/04/2023) 14 tablet 0   tiZANidine (ZANAFLEX) 4 MG tablet Take 1 tablet (4 mg total) by mouth at bedtime. (Patient not taking: Reported on 06/04/2023) 30 tablet 0   VITAMIN D PO Take by mouth. 4000IU (Patient not taking: Reported on 01/20/2023)     No current facility-administered medications for this visit.     Physical Exam    VS:  BP 120/80 (BP Location: Left Arm, Patient Position: Sitting, Cuff Size: Normal)   Pulse 69  Ht 5\' 6"  (1.676 m)   Wt 149 lb 4 oz (67.7 kg)   LMP 10/19/2020   SpO2 97%   BMI 24.09 kg/m  , BMI Body mass index is 24.09 kg/m.       GEN: Well nourished, well developed, in no acute distress. HEENT: normal. Neck: Supple, no JVD, carotid bruits, or masses. Cardiac: RRR, no murmurs, rubs, or gallops. No clubbing, cyanosis, edema.  Radials 2+/PT 2+ and equal bilaterally.  Respiratory:  Respirations regular and unlabored, clear to auscultation bilaterally. GI: Soft, nontender, nondistended, BS + x 4. MS: no deformity or atrophy. Skin: warm and dry, no rash. Neuro:  Strength and sensation are intact. Psych: Normal affect.  Accessory Clinical Findings    ECG personally reviewed by me today - EKG Interpretation Date/Time:  Thursday June 04 2023 13:48:13 EST Ventricular Rate:  69 PR Interval:  150 QRS Duration:  80 QT Interval:  396 QTC Calculation: 424 R Axis:   43  Text Interpretation: Normal sinus rhythm Possible Left atrial enlargement Confirmed by Nicolasa Ducking [phone]) on 06/04/2023 1:54:23 PM  - no acute changes.  Lab Results  Component Value Date   WBC 4.9 02/20/2023   HGB 13.2 02/20/2023   HCT 39.1 02/20/2023   MCV 96.1 02/20/2023   PLT 300 02/20/2023   Lab Results  Component Value Date   CREATININE 0.85 02/20/2023   BUN 10 02/20/2023   NA  141 02/20/2023   K 3.4 (L) 02/20/2023   CL 107 02/20/2023   CO2 27 02/20/2023   Lab Results  Component Value Date   ALT 46 (H) 07/29/2022   AST 35 07/29/2022   ALKPHOS 118 07/29/2022   BILITOT 0.3 07/29/2022   Lab Results  Component Value Date   CHOL 155 07/29/2022   HDL 56 07/29/2022   LDLCALC 80 07/29/2022   TRIG 108 07/29/2022   CHOLHDL 2.8 07/29/2022    Lab Results  Component Value Date   HGBA1C 5.8 (H) 07/29/2022   Lab Results  Component Value Date   TSH 1.240 07/29/2022       Assessment & Plan    1.  Primary hypertension: Blood pressure much improved after switching metoprolol to carvedilol and consolidating lisinopril.  She is 120/80 today.  Note medication changes today.  2.  Takotsubo cardiomyopathy/chronic heart failure with improved ejection fraction: EF 55 to 6% with grade 1 diastolic dysfunction by echo in January 2024.  She is euvolemic on exam and is active without limitations.  Continue beta-blocker and ACE inhibitor.  3.  Nonobstructive CAD: No chest pain.  She remains on aspirin, statin, and beta-blocker.  4.  Hyperlipidemia: LDL of 80 in April 2024.  She remains on atorvastatin 40 mg daily.  Encouraged regular exercise and avoidance of meats and processed foods.  Would consider titrating atorvastatin versus adding ezetimibe if LDL remains greater than 70 on follow-up, which will be due at annual visit with primary care.  5.  E-cigarette use: Previously smoked cigarettes but quit at the time of her event in 2022.  Over the past year, she has been smoking e-cigarettes.  1 e-cigarette lasts her about 3 weeks.  She is not able to smoke during the day at work and only uses it in the evening.  She says it helps to relax her.  We discussed alternatives, as it sounds like her nicotine need is quite low.  She is considering using nicotine gum.  She is committed to getting off of the cigarettes.  Complete  cessation advised.  6.  Palpitations: Rare skipped beat  without sustained palpitations or elevations in heart rates.  We mutually agreed to hold off on monitoring in the setting of paucity of symptoms.  She will let us know if symptoms increase.    7.  Disposition: Follow-up in 1 year or sooner if necessary.  Nicolasa Ducking, NP 06/04/2023, 2:20 PM

## 2023-06-04 NOTE — Patient Instructions (Signed)
Medication Instructions:  No changes *If you need a refill on your cardiac medications before your next appointment, please call your pharmacy*   Lab Work: None ordered If you have labs (blood work) drawn today and your tests are completely normal, you will receive your results only by: MyChart Message (if you have MyChart) OR A paper copy in the mail If you have any lab test that is abnormal or we need to change your treatment, we will call you to review the results.   Testing/Procedures: None ordered   Follow-Up: At [facility], you and your health needs are our priority.  As part of our continuing mission to provide you with exceptional heart care, we have created designated Provider Care Teams.  These Care Teams include your primary Cardiologist (physician) and Advanced Practice Providers (APPs -  Physician Assistants and Nurse Practitioners) who all work together to provide you with the care you need, when you need it.  We recommend signing up for the patient portal called "MyChart".  Sign up information is provided on this After Visit Summary.  MyChart is used to connect with patients for Virtual Visits (Telemedicine).  Patients are able to view lab/test results, encounter notes, upcoming appointments, etc.  Non-urgent messages can be sent to your provider as well.   To learn more about what you can do with MyChart, go to [email or web address].    Your next appointment:   12 month(s)  Provider:   Lorine Bears, MD

## 2023-06-08 ENCOUNTER — Ambulatory Visit
Admission: EM | Admit: 2023-06-08 | Discharge: 2023-06-08 | Disposition: A | Discharge status: Home / Self Care | Payer: 59 | Attending: Emergency Medicine | Admitting: Emergency Medicine

## 2023-06-08 DIAGNOSIS — J101 Influenza due to other identified influenza virus with other respiratory manifestations: Secondary | ICD-10-CM

## 2023-06-08 LAB — POC COVID19/FLU A&B COMBO
Covid Antigen, POC: NEGATIVE
Influenza A Antigen, POC: POSITIVE — AB
Influenza B Antigen, POC: NEGATIVE

## 2023-06-08 NOTE — ED Provider Notes (Signed)
 Isabella Burch    CSN: [id number] Arrival date & time: 06/08/23  1611      History   Chief Complaint Chief Complaint  Patient presents with   Nasal Congestion    HPI Isabella Burch is a 53 y.o. female.  Patient presents on day 4 of headache, fatigue, congestion, sore throat, cough.  No fever or shortness of breath.  No vomiting or diarrhea.  She has been treating her symptoms with Tylenol and ibuprofen.  The history is provided by the patient and medical records.    Past Medical History:  Diagnosis Date   Anxiety    Ascending aorta dilatation (HCC)    a. 11/2020 CTA chest: 3.8cm asc Ao.   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 11/2020 Echo: EF 20-25%; b. 02/2021 Echo: EF 55-60%, no rwma; c. 04/2022 Echo: EF 55-60%, GrI DD, nl RV fxn, and no significant valvular dzs.   Extensor tendon rupture, non-traumatic, hand and wrist    right   Heart murmur    History of kidney stones    Hypertension    Nonobstructive CAD (coronary artery disease)    a. 11/2020 NSTEMI/Cath: LM nl, LAD 37m, D1/2 nl, LCX min irregs, RCA nl, RPDA/RPAV nl.   Rheumatoid arthritis (HCC) dx 2006   BILATERAL HANDS AND RIGHT ANKLE   Takotsubo cardiomyopathy    a. 11/2020 Echo: EF 20-25%, mid-apical ant, inf, and apical AK. Nl RV size/fxn. Mild MR; b. 11/2020 Cath: nonobs dzs; c. 02/2021 Echo: EF 55-60%; c. 04/2022 Echo: EF 55-60%.   Tobacco abuse     Patient Active Problem List   Diagnosis Date Noted   Prediabetes 08/01/2022   Annual physical exam 08/01/2022   Acquired dilation of ascending aorta and aortic root (HCC) 08/01/2022   Screening for colon cancer 07/29/2022   Chronic HFrEF (heart failure with reduced ejection fraction) (HCC) 09/10/2021   Depression, recurrent (HCC) 09/10/2021   Dyslipidemia, goal LDL below 70 09/10/2021   Ascending aorta dilatation (HCC) 12/09/2020   Vitamin D deficiency 02/07/2020   Rheumatoid arthritis (HCC) 11/22/2015   Screening  mammogram for breast cancer 02/25/2013    Past Surgical History:  Procedure Laterality Date   CYSTOSCOPY WITH URETEROSCOPY, STONE BASKETRY AND STENT PLACEMENT  2011   LEFT HEART CATH AND CORONARY ANGIOGRAPHY N/A 12/10/2020   Procedure: LEFT HEART CATH AND CORONARY ANGIOGRAPHY;  Surgeon: Iran Ouch, MD;  Location: [facility] INV[facility]IVE [facility];  Service: Cardiovascular;  Laterality: N/A;   LEFT WRIST EXTENSOR TENDON REPAIR     TENDON TRANSFER Right 11/09/2014   Procedure: RIGHT THUMB EIP TO EPL TENDON TRANSFER;  Surgeon: Bradly Bienenstock, MD;  Location: [facility] [place];  Service: Orthopedics;  Laterality: Right;   TUBAL LIGATION      OB History     Gravida  2   Para      Term      Preterm      AB      Living  2      SAB      IAB      Ectopic      Multiple      Live Births               Home Medications    Prior to Admission medications   Medication Sig Start Date End Date Taking? Authorizing Provider  acetaminophen (TYLENOL) 500 MG tablet Take 500 mg by mouth every 6 (six) hours as  needed for moderate pain, mild pain, headache or fever.    [provider]  aspirin EC 81 MG EC tablet Take 1 tablet (81 mg total) by mouth daily. Swallow whole. 12/12/20   Lewie Chamber, MD  atorvastatin (LIPITOR) 40 MG tablet TAKE 1 TABLET BY MOUTH EVERY DAY 04/16/23   Creig Hines, NP  carvedilol (COREG) 3.125 MG tablet TAKE 1 TABLET BY MOUTH 2 TIMES DAILY. -- STOP METOPROLOL 05/20/23   End, Cristal Deer, MD  DULoxetine (CYMBALTA) 60 MG capsule Take 1 capsule (60 mg total) by mouth daily. 05/28/23   Simmons-Robinson, Tawanna Cooler, MD  estradiol (VIVELLE-DOT) 0.05 MG/24HR patch Place 1 patch (0.05 mg total) onto the skin 2 (two) times a week. 08/07/22   Jacky Kindle, FNP  Fezolinetant (VEOZAH) 45 MG TABS Take 1 tablet (45 mg total) by mouth daily. Patient not taking: Reported on 06/04/2023 08/04/22   Jacky Kindle, FNP  fluticasone [facility]) 50 MCG/ACT nasal  spray SPRAY 2 SPRAYS INTO EACH NOSTRIL EVERY DAY 05/01/21   Flinchum, Eula Fried, FNP  hydroxychloroquine (PLAQUENIL) 200 MG tablet Take one tablet TWICE daily weekdays. Take one tablet ONCE daily weekends. Take with food. 12/02/22   [provider]  leflunomide (ARAVA) 10 MG tablet TAKE 1 TABLET BY MOUTH ONCE A DAY 07/17/14   [provider]  lisinopril (ZESTRIL) 40 MG tablet Take 1 tablet (40 mg total) by mouth daily. 02/25/23   Creig Hines, NP  methocarbamol (ROBAXIN) 750 MG tablet TAKE 1 TABLET (750 MG TOTAL) BY MOUTH 2 (TWO) TIMES DAILY AS NEEDED FOR MUSCLE SPASMS. Patient not taking: Reported on 06/04/2023 01/01/22   Jacky Kindle, FNP  ondansetron (ZOFRAN ODT) 4 MG disintegrating tablet Take 1 tablet (4 mg total) by mouth every 8 (eight) hours as needed for nausea or vomiting. Patient not taking: Reported on 06/04/2023 04/27/20   Concha Se, MD  [facility] CLICKJECT 125 MG/ML SOAJ Inject 1 Syringe into the skin once a week. 05/30/19   [provider]  promethazine-dextromethorphan (PROMETHAZINE-DM) 6.25-15 MG/5ML syrup Take 5 mLs by mouth 4 (four) times daily as needed for cough. Patient not taking: Reported on 06/04/2023 01/06/22   Theadora Rama Scales, PA-C  sulfamethoxazole-trimethoprim (BACTRIM DS) 800-160 MG tablet Take 1 tablet by mouth 2 (two) times daily. Patient not taking: Reported on 06/04/2023 07/29/22   Jacky Kindle, FNP  tiZANidine (ZANAFLEX) 4 MG tablet Take 1 tablet (4 mg total) by mouth at bedtime. Patient not taking: Reported on 06/04/2023 03/02/22   Wallis Bamberg, PA-C  VITAMIN D PO Take by mouth. 4000IU Patient not taking: Reported on 01/20/2023    [provider]    Family History Family History  Problem Relation Age of Onset   Hypothyroidism Mother    Coronary artery disease Mother    Hyperlipidemia Father    Hypertension Father    Diabetes Father     Social History Social History   Tobacco Use   Smoking status: Every  Day    Types: E-cigarettes   Smokeless tobacco: Never   Tobacco comments:    0.5 ppd for much of her life but currently smoking 2-4 cigarettes/day (11/2020)  Vaping Use   Vaping status: Every Day  Substance Use Topics   Alcohol use: No    Alcohol/week: 0.0 standard drinks of alcohol   Drug use: No     Allergies   Adalimumab and Etanercept   Review of Systems Review of Systems  Constitutional:  Positive for fatigue. Negative  for chills and fever.  HENT:  Positive for congestion and sore throat. Negative for ear pain.   Respiratory:  Positive for cough. Negative for shortness of breath.   Gastrointestinal:  Negative for diarrhea and vomiting.  Neurological:  Positive for headaches.     Physical Exam Triage Vital Signs ED Triage Vitals  Encounter Vitals Group     BP 06/08/23 1638 94/69     Systolic BP Percentile --      Diastolic BP Percentile --      Pulse Rate 06/08/23 1638 83     Resp 06/08/23 1638 18     Temp 06/08/23 1638 99.1 F (37.3 C)     Temp src --      SpO2 06/08/23 1638 96 %     Weight --      Height --      Head Circumference --      Peak Flow --      Pain Score 06/08/23 1644 8     Pain Loc --      Pain Education --      Exclude from Growth Chart --    No data found.  Updated Vital Signs BP 94/69   Pulse 83   Temp 99.1 F (37.3 C)   Resp 18   LMP 10/19/2020   SpO2 96%   Visual Acuity Right Eye Distance:   Left Eye Distance:   Bilateral Distance:    Right Eye Near:   Left Eye Near:    Bilateral Near:     Physical Exam Constitutional:      General: She is not in acute distress. HENT:     Right Ear: Tympanic membrane normal.     Left Ear: Tympanic membrane normal.     Nose: Rhinorrhea present.     Mouth/Throat:     Mouth: Mucous membranes are moist.     Pharynx: Oropharynx is clear.  Cardiovascular:     Rate and Rhythm: Normal rate and regular rhythm.     Heart sounds: Normal heart sounds.  Pulmonary:     Effort: Pulmonary  effort is normal. No respiratory distress.     Breath sounds: Normal breath sounds.  Neurological:     Mental Status: She is alert.      UC Treatments / Results  Labs (all labs ordered are listed, but only abnormal results are displayed) Labs Reviewed  POC COVID19/FLU A&B COMBO - Abnormal; Notable for the following components:      Result Value   Influenza A Antigen, POC Positive (*)    All other components within normal limits    EKG   Radiology No results found.  Procedures Procedures (including critical care time)  Medications Ordered in UC Medications - No data to display  Initial Impression / Assessment and Plan / UC Course  I have reviewed the triage vital signs and the nursing notes.  Pertinent labs & imaging results that were available during my care of the patient were reviewed by me and considered in my medical decision making (see chart for details).    Influenza A.  Rapid flu positive for influenza A.  COVID test is negative.  Patient is outside the window for treatment.  Discussed symptomatic management including Tylenol or ibuprofen, plain Mucinex, rest, hydration.  Education provided on influenza.  Instructed patient to follow-up with her PCP.  ED precautions given.  She agrees to plan of care.  Final Clinical Impressions(s) / UC Diagnoses   Final diagnoses:  Influenza A     Discharge Instructions      Your test is positive for influenza A.    Take Tylenol or ibuprofen as needed for fever or discomfort.  Take plain Mucinex as needed for congestion.  Rest and keep yourself hydrated.    Follow-up with your primary care provider if your symptoms are not improving.         ED Prescriptions   None    PDMP not reviewed this encounter.   Mickie Bail, NP 06/08/23 1726

## 2023-06-08 NOTE — ED Triage Notes (Signed)
 Patient to Urgent Care with complaints of nasal congestion/ headaches/ fatigue/ sore throat/ cough. Denies any fevers.  Symptoms started Friday evening. Home Covid test negative.   Meds: Rotating tylenol and Motrin.

## 2023-06-08 NOTE — Discharge Instructions (Addendum)
 Your test is positive for influenza A.    Take Tylenol or ibuprofen as needed for fever or discomfort.  Take plain Mucinex as needed for congestion.  Rest and keep yourself hydrated.    Follow-up with your primary care provider if your symptoms are not improving.

## 2023-06-15 ENCOUNTER — Ambulatory Visit: Payer: 59 | Admitting: Family Medicine

## 2023-06-15 ENCOUNTER — Encounter: Payer: Self-pay | Admitting: Family Medicine

## 2023-06-15 ENCOUNTER — Other Ambulatory Visit (HOSPITAL_COMMUNITY)
Admission: RE | Admit: 2023-06-15 | Discharge: 2023-06-15 | Disposition: A | Discharge status: Home / Self Care | Payer: 59 | Source: Ambulatory Visit | Attending: Family Medicine | Admitting: Family Medicine

## 2023-06-15 VITALS — BP 106/77 | HR 77 | Ht 66.0 in | Wt 143.0 lb

## 2023-06-15 DIAGNOSIS — Z1231 Encounter for screening mammogram for malignant neoplasm of breast: Secondary | ICD-10-CM

## 2023-06-15 DIAGNOSIS — F339 Major depressive disorder, recurrent, unspecified: Secondary | ICD-10-CM

## 2023-06-15 DIAGNOSIS — N951 Menopausal and female climacteric states: Secondary | ICD-10-CM | POA: Diagnosis not present

## 2023-06-15 DIAGNOSIS — Z124 Encounter for screening for malignant neoplasm of cervix: Secondary | ICD-10-CM

## 2023-06-15 DIAGNOSIS — E785 Hyperlipidemia, unspecified: Secondary | ICD-10-CM | POA: Diagnosis not present

## 2023-06-15 DIAGNOSIS — Z1211 Encounter for screening for malignant neoplasm of colon: Secondary | ICD-10-CM

## 2023-06-15 MED ORDER — DULOXETINE HCL 60 MG PO CPEP: 60.0000 mg | ORAL_CAPSULE | Freq: Every day | ORAL | 1 refills | Status: DC

## 2023-06-15 MED ORDER — ESTRADIOL 0.05 MG/24HR TD PTTW: 1.0000 | MEDICATED_PATCH | TRANSDERMAL | 3 refills | Status: DC

## 2023-06-15 NOTE — Assessment & Plan Note (Signed)
 Managed with Cymbalta 60 mg daily. Medication helps with anxiety. Emphasized adherence for optimal symptom management.   Chronic well controlled on current regimen - Refill Cymbalta 60 mg daily

## 2023-06-15 NOTE — Assessment & Plan Note (Signed)
 Managed with estradiol 0.05 mg patches, changed twice a week. Patches are effective. Discussed benefits of hormone replacement therapy and importance of adherence.   - Refill estradiol 0.05 mg patches, change twice a week

## 2023-06-15 NOTE — Progress Notes (Signed)
 Established patient visit   Patient: Isabella Burch   DOB: 22-Dec-1970   52 y.o. Female  MRN: [id number] Visit Date: 06/15/2023  Today's healthcare provider: Ronnald Ramp, MD   Chief Complaint  Patient presents with   Medication Refill    She needs Cymbalta and estradiol patch refilled    Health Maintenance    Vaccine was no in clinic during the time of her visit    Subjective     HPI     Medication Refill    Additional comments: She needs Cymbalta and estradiol patch refilled         Health Maintenance    Additional comments: Vaccine was no in clinic during the time of her visit       Last edited by Thedora Hinders, CMA on 06/15/2023  3:19 PM.       Discussed the use of AI scribe software for clinical note transcription with the patient, who gave verbal consent to proceed.  History of Present Illness   Isabella Burch is a 53 year old female who presents for follow-up regarding her chronic depression.  She is currently taking Cymbalta 60 mg daily, which helps with her anxiety. Her prescription had run out, prompting this visit to get it refilled. She also uses estradiol 0.05 mg patches, changed twice a week, for menopausal symptoms.  She experienced flu symptoms last week and was diagnosed with influenza A at a walk-in clinic. She had a severe headache and cough, described as 'awful,' but is feeling better now except for some residual cough and hoarseness. She has been using liquid cortisone at night to help with sleep during this period. No current fever or COVID-19 infection.  She has a history of chronic heart failure with reduced ejection fraction and continues to follow up with cardiology. She is managing dyslipidemia with a goal of LDL less than 70, prediabetes, and rheumatoid arthritis. For rheumatoid arthritis, she takes Plaquenil 200 mg twice daily during the week and once daily on weekends, and Arava 10 mg daily. She is not currently taking  Veozah 45 mg daily. She has a history of hypertension and is on lisinopril 40 mg daily.  She mentioned a neck injury resulting in a concussion, for which she is seeing Dr. Shon Baton at [facility] in [place]. She has undergone an epidural steroid injection without relief and is scheduled for a nerve block on March 7 to further assess the issue.         Past Medical History:  Diagnosis Date   Anxiety    Ascending aorta dilatation (HCC)    a. 11/2020 CTA chest: 3.8cm asc Ao.   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 11/2020 Echo: EF 20-25%; b. 02/2021 Echo: EF 55-60%, no rwma; c. 04/2022 Echo: EF 55-60%, GrI DD, nl RV fxn, and no significant valvular dzs.   Extensor tendon rupture, non-traumatic, hand and wrist    right   Heart murmur    History of kidney stones    Hypertension    Nonobstructive CAD (coronary artery disease)    a. 11/2020 NSTEMI/Cath: LM nl, LAD 64m, D1/2 nl, LCX min irregs, RCA nl, RPDA/RPAV nl.   Rheumatoid arthritis (HCC) dx 2006   BILATERAL HANDS AND RIGHT ANKLE   Takotsubo cardiomyopathy    a. 11/2020 Echo: EF 20-25%, mid-apical ant, inf, and apical AK. Nl RV size/fxn. Mild MR; b. 11/2020 Cath: nonobs dzs; c. 02/2021 Echo:  EF 55-60%; c. 04/2022 Echo: EF 55-60%.   Tobacco abuse     Medications: Outpatient Medications Prior to Visit  Medication Sig   acetaminophen (TYLENOL) 500 MG tablet Take 500 mg by mouth every 6 (six) hours as needed for moderate pain, mild pain, headache or fever.   aspirin EC 81 MG EC tablet Take 1 tablet (81 mg total) by mouth daily. Swallow whole.   atorvastatin (LIPITOR) 40 MG tablet TAKE 1 TABLET BY MOUTH EVERY DAY   carvedilol (COREG) 3.125 MG tablet TAKE 1 TABLET BY MOUTH 2 TIMES DAILY. -- STOP METOPROLOL   fluticasone (FLONASE) 50 MCG/ACT nasal spray SPRAY 2 SPRAYS INTO EACH NOSTRIL EVERY DAY   hydroxychloroquine (PLAQUENIL) 200 MG tablet Take one tablet TWICE daily weekdays. Take one tablet ONCE daily  weekends. Take with food.   leflunomide (ARAVA) 10 MG tablet TAKE 1 TABLET BY MOUTH ONCE A DAY   lisinopril (ZESTRIL) 40 MG tablet Take 1 tablet (40 mg total) by mouth daily.   ORENCIA CLICKJECT 125 MG/ML SOAJ Inject 1 Syringe into the skin once a week.   [DISCONTINUED] DULoxetine (CYMBALTA) 60 MG capsule Take 1 capsule (60 mg total) by mouth daily.   [DISCONTINUED] estradiol (VIVELLE-DOT) 0.05 MG/24HR patch Place 1 patch (0.05 mg total) onto the skin 2 (two) times a week.   Fezolinetant (VEOZAH) 45 MG TABS Take 1 tablet (45 mg total) by mouth daily. (Patient not taking: Reported on 06/04/2023)   methocarbamol (ROBAXIN) 750 MG tablet TAKE 1 TABLET (750 MG TOTAL) BY MOUTH 2 (TWO) TIMES DAILY AS NEEDED FOR MUSCLE SPASMS. (Patient not taking: Reported on 06/04/2023)   ondansetron (ZOFRAN ODT) 4 MG disintegrating tablet Take 1 tablet (4 mg total) by mouth every 8 (eight) hours as needed for nausea or vomiting. (Patient not taking: Reported on 06/04/2023)   promethazine-dextromethorphan (PROMETHAZINE-DM) 6.25-15 MG/5ML syrup Take 5 mLs by mouth 4 (four) times daily as needed for cough. (Patient not taking: Reported on 06/04/2023)   sulfamethoxazole-trimethoprim (BACTRIM DS) 800-160 MG tablet Take 1 tablet by mouth 2 (two) times daily. (Patient not taking: Reported on 06/04/2023)   tiZANidine (ZANAFLEX) 4 MG tablet Take 1 tablet (4 mg total) by mouth at bedtime. (Patient not taking: Reported on 06/04/2023)   VITAMIN D PO Take by mouth. 4000IU (Patient not taking: Reported on 01/20/2023)   No facility-administered medications prior to visit.    Review of Systems  Last CBC Lab Results  Component Value Date   WBC 4.9 02/20/2023   HGB 13.2 02/20/2023   HCT 39.1 02/20/2023   MCV 96.1 02/20/2023   MCH 32.4 02/20/2023   RDW 12.0 02/20/2023   PLT 300 02/20/2023   Last metabolic panel Lab Results  Component Value Date   GLUCOSE 119 (H) 02/20/2023   NA 141 02/20/2023   K 3.4 (L) 02/20/2023   CL 107  02/20/2023   CO2 27 02/20/2023   BUN 10 02/20/2023   CREATININE 0.85 02/20/2023   GFRNONAA >60 02/20/2023   CALCIUM 8.5 (L) 02/20/2023   PROT 6.3 07/29/2022   ALBUMIN 4.2 07/29/2022   LABGLOB 2.1 07/29/2022   AGRATIO 2.0 07/29/2022   BILITOT 0.3 07/29/2022   ALKPHOS 118 07/29/2022   AST 35 07/29/2022   ALT 46 (H) 07/29/2022   ANIONGAP 7 02/20/2023   Last lipids Lab Results  Component Value Date   CHOL 155 07/29/2022   HDL 56 07/29/2022   LDLCALC 80 07/29/2022   TRIG 108 07/29/2022   CHOLHDL 2.8 07/29/2022   Last  hemoglobin A1c Lab Results  Component Value Date   HGBA1C 5.8 (H) 07/29/2022   Last thyroid functions Lab Results  Component Value Date   TSH 1.240 07/29/2022        Objective    BP 106/77 (BP Location: Left Arm, Patient Position: Sitting, Cuff Size: Large)   Pulse 77   Ht 5\' 6"  (1.676 m)   Wt 143 lb (64.9 kg)   LMP 10/19/2020   SpO2 97%   BMI 23.08 kg/m  BP Readings from Last 3 Encounters:  06/15/23 106/77  06/08/23 94/69  06/04/23 120/80   Wt Readings from Last 3 Encounters:  06/15/23 143 lb (64.9 kg)  06/04/23 149 lb 4 oz (67.7 kg)  02/25/23 146 lb 3.2 oz (66.3 kg)        Physical Exam Vitals reviewed. Exam conducted with a chaperone present.  Constitutional:      General: She is not in acute distress.    Appearance: Normal appearance. She is not ill-appearing, toxic-appearing or diaphoretic.  Eyes:     Conjunctiva/sclera: Conjunctivae normal.  Cardiovascular:     Rate and Rhythm: Normal rate and regular rhythm.     Pulses: Normal pulses.     Heart sounds: Normal heart sounds. No murmur heard.    No friction rub. No gallop.  Pulmonary:     Effort: Pulmonary effort is normal. No respiratory distress.     Breath sounds: Normal breath sounds. No stridor. No wheezing, rhonchi or rales.  Abdominal:     General: Bowel sounds are normal. There is no distension.     Palpations: Abdomen is soft.     Tenderness: There is no abdominal  tenderness.  Genitourinary:    General: Normal vulva.     Pubic Area: No rash or pubic lice.      Labia:        Right: No rash, tenderness, lesion or injury.        Left: No rash, tenderness, lesion or injury.      Urethra: No urethral pain, urethral swelling or urethral lesion.     Vagina: No vaginal discharge, erythema, tenderness, bleeding or lesions.     Cervix: Normal.     Uterus: Normal.      Adnexa: Right adnexa normal and left adnexa normal.     Rectum: Normal.  Musculoskeletal:     Right lower leg: No edema.     Left lower leg: No edema.  Skin:    Findings: No erythema or rash.  Neurological:     Mental Status: She is alert and oriented to person, place, and time.  Psychiatric:        Mood and Affect: Mood and affect normal.        Speech: Speech normal.        Behavior: Behavior normal. Behavior is cooperative.       No results found for any visits on 06/15/23.  Assessment & Plan     Problem List Items Addressed This Visit       Other   Screening for colon cancer   Relevant Orders   Ambulatory referral to Gastroenterology   Menopausal vasomotor syndrome - Primary   Managed with estradiol 0.05 mg patches, changed twice a week. Patches are effective. Discussed benefits of hormone replacement therapy and importance of adherence.   - Refill estradiol 0.05 mg patches, change twice a week        Relevant Medications   estradiol (VIVELLE-DOT) 0.05 MG/24HR patch   Dyslipidemia,  goal LDL below 70   Chronic  Continue follow up with cardiology as scheduled  Continue atorvastatin 40mg  daily       Depression, recurrent (HCC)   Managed with Cymbalta 60 mg daily. Medication helps with anxiety. Emphasized adherence for optimal symptom management.   Chronic well controlled on current regimen - Refill Cymbalta 60 mg daily        Relevant Medications   DULoxetine (CYMBALTA) 60 MG capsule   Other Visit Diagnoses       Breast cancer screening by mammogram        Relevant Orders   MM 3D SCREENING MAMMOGRAM BILATERAL BREAST     Screening for cervical cancer       Relevant Orders   Cytology - PAP       Influenza A   Recent episode confirmed by walk-in clinic. Symptoms included severe headache and cough. Currently recovering with residual cough and hoarseness.   Acute,improving  - no additional recommendations today  - Monitor symptoms and follow up if she worsens      General Health Maintenance   Needs mammogram, colon cancer screening, and Pap smear. Discussed importance of routine screenings for early detection of cancer and other conditions.   - Order mammogram   - Send referral for colonoscopy   - Performed Pap smear today   - Screen for sexually transmitted infections (gonorrhea, chlamydia)      Return in about 3 months (around 09/12/2023) for CPE.         Ronnald Ramp, MD  [facility] [phone] (phone) [phone] (fax)  [facility]

## 2023-06-15 NOTE — Assessment & Plan Note (Signed)
 Chronic  Continue follow up with cardiology as scheduled  Continue atorvastatin 40mg  daily

## 2023-06-17 LAB — CYTOLOGY - PAP
Chlamydia: NEGATIVE
Comment: NEGATIVE
Comment: NEGATIVE
Comment: NEGATIVE
Comment: NORMAL
Diagnosis: NEGATIVE
High risk HPV: POSITIVE — AB
Neisseria Gonorrhea: NEGATIVE
Trichomonas: NEGATIVE

## 2023-06-18 ENCOUNTER — Encounter: Payer: Self-pay | Admitting: Family Medicine

## 2023-06-23 ENCOUNTER — Encounter: Payer: Self-pay | Admitting: *Deleted

## 2023-06-23 ENCOUNTER — Encounter: Payer: Self-pay | Admitting: Family Medicine

## 2023-07-24 ENCOUNTER — Other Ambulatory Visit: Payer: Self-pay | Admitting: Nurse Practitioner

## 2023-07-25 ENCOUNTER — Other Ambulatory Visit: Payer: Self-pay | Admitting: Nurse Practitioner

## 2023-07-30 ENCOUNTER — Encounter: Payer: 59 | Admitting: Family Medicine

## 2023-07-30 NOTE — Progress Notes (Deleted)
 Complete physical exam   Patient: Isabella Burch   DOB: 11-18-1970   52 y.o. Female  MRN: [id number] Visit Date: 07/30/2023  Today's healthcare provider: Ronnald Ramp, MD   No chief complaint on file.  Subjective    Isabella Burch is a 53 y.o. female who presents today for a complete physical exam.   She reports consuming a {diet types:17450} diet.   {Exercise:19826}    She {does/does not:200015} have additional problems to discuss today.   Discussed the use of AI scribe software for clinical note transcription with the patient, who gave verbal consent to proceed.  History of Present Illness      Past Medical History:  Diagnosis Date   Anxiety    Ascending aorta dilatation (HCC)    a. 11/2020 CTA chest: 3.8cm asc Ao.   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 11/2020 Echo: EF 20-25%; b. 02/2021 Echo: EF 55-60%, no rwma; c. 04/2022 Echo: EF 55-60%, GrI DD, nl RV fxn, and no significant valvular dzs.   Extensor tendon rupture, non-traumatic, hand and wrist    right   Heart murmur    History of kidney stones    Hypertension    Nonobstructive CAD (coronary artery disease)    a. 11/2020 NSTEMI/Cath: LM nl, LAD 28m, D1/2 nl, LCX min irregs, RCA nl, RPDA/RPAV nl.   Rheumatoid arthritis (HCC) dx 2006   BILATERAL HANDS AND RIGHT ANKLE   Takotsubo cardiomyopathy    a. 11/2020 Echo: EF 20-25%, mid-apical ant, inf, and apical AK. Nl RV size/fxn. Mild MR; b. 11/2020 Cath: nonobs dzs; c. 02/2021 Echo: EF 55-60%; c. 04/2022 Echo: EF 55-60%.   Tobacco abuse    Past Surgical History:  Procedure Laterality Date   CYSTOSCOPY WITH URETEROSCOPY, STONE BASKETRY AND STENT PLACEMENT  2011   LEFT HEART CATH AND CORONARY ANGIOGRAPHY N/A 12/10/2020   Procedure: LEFT HEART CATH AND CORONARY ANGIOGRAPHY;  Surgeon: Iran Ouch, MD;  Location: [facility];  Service: Cardiovascular;  Laterality: N/A;   LEFT WRIST EXTENSOR TENDON REPAIR      TENDON TRANSFER Right 11/09/2014   Procedure: RIGHT THUMB EIP TO EPL TENDON TRANSFER;  Surgeon: Bradly Bienenstock, MD;  Location: [facility];  Service: Orthopedics;  Laterality: Right;   TUBAL LIGATION     Social History   Socioeconomic History   Marital status: Married    Spouse name: Shyvonne Chastang   Number of children: 2   Years of education: 12   Highest education level: Not on file  Occupational History   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    Employer: [employer]    Comment: [employer]   Occupation: Estate agent    Comment: Part-time job  Tobacco Use   Smoking status: Every Day    Types: E-cigarettes   Smokeless tobacco: Never   Tobacco comments:    0.5 ppd for much of her life but currently smoking 2-4 cigarettes/day (11/2020)  Vaping Use   Vaping status: Every Day  Substance and Sexual Activity   Alcohol use: No    Alcohol/week: 0.0 standard drinks of alcohol   Drug use: No   Sexual activity: Not on file  Other Topics Concern   Not on file  Social History Narrative   Daneka grew up in [place]. She lives at home with her husband, two children and a granddaughter. They have 2 dogs (great pyreneees and dachbull).  She works at  the [employer] in [facility]. She enjoys working and being around children.    Social Drivers of Corporate investment banker Strain: Low Risk  (06/15/2023)   Overall Financial Resource Strain (CARDIA)    Difficulty of Paying Living Expenses: Not hard at all  Food Insecurity: No Food Insecurity (06/15/2023)   Hunger Vital Sign    Worried About Running Out of Food in the Last Year: Never true    Ran Out of Food in the Last Year: Never true  Transportation Needs: No Transportation Needs (06/15/2023)   PRAPARE - Administrator, Civil Service (Medical): No    Lack of Transportation (Non-Medical): No  Physical Activity: Not on file  Stress: No Stress Concern Present (06/15/2023)    [employer] of Occupational Health - Occupational Stress Questionnaire    Feeling of Stress : Not at all  Social Connections: Moderately Isolated (06/15/2023)   Social Connection and Isolation Panel [NHANES]    Frequency of Communication with Friends and Family: More than three times a week    Frequency of Social Gatherings with Friends and Family: More than three times a week    Attends Religious Services: More than 4 times per year    Active Member of [employer] or Organizations: No    Attends Banker Meetings: Never    Marital Status: Separated  Intimate Partner Violence: Not At Risk (06/15/2023)   Humiliation, Afraid, Rape, and Kick questionnaire    Fear of Current or Ex-Partner: No    Emotionally Abused: No    Physically Abused: No    Sexually Abused: No   Family Status  Relation Name Status   Mother  Alive   Father  Deceased   Sister  Alive   Sister  Alive   Daughter  Alive   Son  Alive  No partnership data on file   Family History  Problem Relation Age of Onset   Hypothyroidism Mother    Coronary artery disease Mother    Hyperlipidemia Father    Hypertension Father    Diabetes Father    Allergies  Allergen Reactions   Adalimumab Other (See Comments)    kidney dysfunction   Etanercept Swelling and Other (See Comments)     Medications: Outpatient Medications Prior to Visit  Medication Sig   acetaminophen (TYLENOL) 500 MG tablet Take 500 mg by mouth every 6 (six) hours as needed for moderate pain, mild pain, headache or fever.   aspirin EC 81 MG EC tablet Take 1 tablet (81 mg total) by mouth daily. Swallow whole.   atorvastatin (LIPITOR) 40 MG tablet TAKE 1 TABLET BY MOUTH EVERY DAY   carvedilol (COREG) 3.125 MG tablet TAKE 1 TABLET BY MOUTH 2 TIMES DAILY. -- STOP METOPROLOL   DULoxetine (CYMBALTA) 60 MG capsule Take 1 capsule (60 mg total) by mouth daily.   estradiol (VIVELLE-DOT) 0.05 MG/24HR patch Place 1 patch (0.05 mg total) onto the skin 2 (two)  times a week.   fluticasone (FLONASE) 50 MCG/ACT nasal spray SPRAY 2 SPRAYS INTO EACH NOSTRIL EVERY DAY   hydroxychloroquine (PLAQUENIL) 200 MG tablet Take one tablet TWICE daily weekdays. Take one tablet ONCE daily weekends. Take with food.   leflunomide (ARAVA) 10 MG tablet TAKE 1 TABLET BY MOUTH ONCE A DAY   lisinopril (ZESTRIL) 40 MG tablet TAKE 1 TABLET BY MOUTH EVERY DAY   methocarbamol (ROBAXIN) 750 MG tablet TAKE 1 TABLET (750 MG TOTAL) BY MOUTH 2 (TWO) TIMES DAILY AS  NEEDED FOR MUSCLE SPASMS. (Patient not taking: Reported on 06/04/2023)   ondansetron (ZOFRAN ODT) 4 MG disintegrating tablet Take 1 tablet (4 mg total) by mouth every 8 (eight) hours as needed for nausea or vomiting. (Patient not taking: Reported on 06/04/2023)   ORENCIA CLICKJECT 125 MG/ML SOAJ Inject 1 Syringe into the skin once a week.   promethazine-dextromethorphan (PROMETHAZINE-DM) 6.25-15 MG/5ML syrup Take 5 mLs by mouth 4 (four) times daily as needed for cough. (Patient not taking: Reported on 06/04/2023)   sulfamethoxazole-trimethoprim (BACTRIM DS) 800-160 MG tablet Take 1 tablet by mouth 2 (two) times daily. (Patient not taking: Reported on 06/04/2023)   tiZANidine (ZANAFLEX) 4 MG tablet Take 1 tablet (4 mg total) by mouth at bedtime. (Patient not taking: Reported on 06/04/2023)   VITAMIN D PO Take by mouth. 4000IU (Patient not taking: Reported on 01/20/2023)   No facility-administered medications prior to visit.    Review of Systems  {Insert previous labs (optional):23779} {See past labs  Heme  Chem  Endocrine  Serology  Results Review (optional):1}  Objective    LMP 10/19/2020  {Insert last BP/Wt (optional):23777}{See vitals history (optional):1}    Physical Exam  ***  Last depression screening scores    06/15/2023    3:20 PM 07/29/2022    3:48 PM 09/10/2021    2:13 PM  PHQ 2/9 Scores  PHQ - 2 Score 4 2 1   PHQ- 9 Score 17 11 4     Last fall risk screening    07/29/2022    3:48 PM  Fall Risk    Falls in the past year? 0  Number falls in past yr: 0  Injury with Fall? 0  Risk for fall due to : No Fall Risks    Last Audit-C alcohol use screening    06/15/2023    3:15 PM  Alcohol Use Disorder Test (AUDIT)  1. How often do you have a drink containing alcohol? 0  2. How many drinks containing alcohol do you have on a typical day when you are drinking? 0   A score of 3 or more in women, and 4 or more in men indicates increased risk for alcohol abuse, EXCEPT if all of the points are from question 1   No results found for any visits on 07/30/23.  Assessment & Plan    Routine Health Maintenance and Physical Exam  Immunization History  Administered Date(s) Administered   Tdap 09/10/2021    Health Maintenance  Topic Date Due   COVID-19 Vaccine (1) Never done   Pneumococcal Vaccine 4-28 Years old (1 of 2 - PCV) Never done   Zoster Vaccines- Shingrix (1 of 2) Never done   Colonoscopy  Never done   MAMMOGRAM  Never done   INFLUENZA VACCINE  11/20/2023   Cervical Cancer Screening (HPV/Pap Cotest)  06/14/2028   DTaP/Tdap/Td (2 - Td or Tdap) 09/11/2031   Hepatitis C Screening  Completed   HIV Screening  Completed   HPV VACCINES  Aged Out   Meningococcal B Vaccine  Aged Out    Problem List Items Addressed This Visit   None   Assessment and Plan Assessment & Plan        No follow-ups on file.       Ronnald Ramp, MD  [facility] [phone] (phone) [phone] (fax)  [facility]

## 2023-08-28 ENCOUNTER — Other Ambulatory Visit: Payer: Self-pay | Admitting: Internal Medicine

## 2023-10-07 ENCOUNTER — Ambulatory Visit: Admitting: Physician Assistant

## 2023-10-07 ENCOUNTER — Encounter: Payer: Self-pay | Admitting: Physician Assistant

## 2023-10-07 VITALS — BP 139/76 | HR 71 | Resp 16 | Ht 66.0 in | Wt 152.0 lb

## 2023-10-07 DIAGNOSIS — F339 Major depressive disorder, recurrent, unspecified: Secondary | ICD-10-CM | POA: Diagnosis not present

## 2023-10-07 DIAGNOSIS — M069 Rheumatoid arthritis, unspecified: Secondary | ICD-10-CM | POA: Diagnosis not present

## 2023-10-07 DIAGNOSIS — J018 Other acute sinusitis: Secondary | ICD-10-CM

## 2023-10-07 DIAGNOSIS — I5022 Chronic systolic (congestive) heart failure: Secondary | ICD-10-CM

## 2023-10-07 MED ORDER — AZITHROMYCIN 250 MG PO TABS: ORAL_TABLET | ORAL | 0 refills | Status: AC

## 2023-10-07 MED ORDER — LEVOCETIRIZINE DIHYDROCHLORIDE 5 MG PO TABS: 5.0000 mg | ORAL_TABLET | Freq: Every evening | ORAL | 2 refills | Status: DC

## 2023-10-07 NOTE — Progress Notes (Signed)
 Established patient visit  Patient: Isabella Burch   DOB: 1970/09/05   52 y.o. Female  MRN: [id number] Visit Date: 10/07/2023  Today's healthcare provider: Blane Bunting, PA-C   Chief Complaint  Patient presents with   Sinusitis    Possible sinus infection since Saturday with no fever. Face with touch.   Subjective     HPI     Sinusitis    Additional comments: Possible sinus infection since Saturday with no fever. Face with touch.      Last edited by Estill Hemming, CMA on 10/07/2023  1:55 PM.       Discussed the use of AI scribe software for clinical note transcription with the patient, who gave verbal consent to proceed.  History of Present Illness Isabella Burch is a 53 year old female with congestive heart failure and rheumatoid arthritis who presents with sinus congestion and headache.  She has experienced sinus congestion and a severe headache for five days. Nasal discharge is thick green mucus. A COVID test is negative. There is no fever, but a slight cough is present.  Her medication choices are influenced by congestive heart failure and rheumatoid arthritis. She avoids Sudafed due to her heart condition. Flonase  causes epistaxis, and Zyrtec is ineffective for her headache.  She is concerned about a possible sinus infection, as she has not been diagnosed with one before. She quit smoking three years ago and takes Cymbalta  for depression and anxiety.       10/07/2023    2:41 PM 06/15/2023    3:20 PM 07/29/2022    3:48 PM  Depression screen PHQ 2/9  Decreased Interest 2 2 1   Down, Depressed, Hopeless 2 2 1   PHQ - 2 Score 4 4 2   Altered sleeping 3 3 2   Tired, decreased energy 3 3 2   Change in appetite 3 2 2   Feeling bad or failure about yourself  2 2 1   Trouble concentrating 2 2 0  Moving slowly or fidgety/restless 2 0 1  Suicidal thoughts 0 1 1  PHQ-9 Score 19 17 11   Difficult doing work/chores Somewhat difficult Somewhat difficult Somewhat difficult       10/07/2023    2:41 PM 06/15/2023    3:20 PM 05/01/2021   10:41 AM 02/07/2020   10:27 AM  GAD 7 : Generalized Anxiety Score  Nervous, Anxious, on Edge 2 2 3 1   Control/stop worrying 1 1 3 1   Worry too much - different things 1 1 3 1   Trouble relaxing 2 2 3 1   Restless 1 1 2 1   Easily annoyed or irritable 3 2 2 1   Afraid - awful might happen 1 1 2 1   Total GAD 7 Score 11 10 18 7   Anxiety Difficulty Somewhat difficult Somewhat difficult Somewhat difficult Somewhat difficult    Medications: Outpatient Medications Prior to Visit  Medication Sig   acetaminophen  (TYLENOL ) 500 MG tablet Take 500 mg by mouth every 6 (six) hours as needed for moderate pain, mild pain, headache or fever.   aspirin  EC 81 MG EC tablet Take 1 tablet (81 mg total) by mouth daily. Swallow whole.   atorvastatin  (LIPITOR ) 40 MG tablet TAKE 1 TABLET BY MOUTH EVERY DAY   carvedilol  (COREG ) 3.125 MG tablet TAKE 1 TABLET BY MOUTH 2 TIMES DAILY. -- STOP METOPROLOL    DULoxetine  (CYMBALTA ) 60 MG capsule Take 1 capsule (60 mg total) by mouth daily.   estradiol  (VIVELLE -DOT) 0.05 MG/24HR patch Place 1 patch (0.05 mg total)  onto the skin 2 (two) times a week.   fluticasone  (FLONASE ) 50 MCG/ACT nasal spray SPRAY 2 SPRAYS INTO EACH NOSTRIL EVERY DAY   hydroxychloroquine (PLAQUENIL) 200 MG tablet Take one tablet TWICE daily weekdays. Take one tablet ONCE daily weekends. Take with food.   leflunomide (ARAVA) 10 MG tablet TAKE 1 TABLET BY MOUTH ONCE A DAY   lisinopril  (ZESTRIL ) 40 MG tablet TAKE 1 TABLET BY MOUTH EVERY DAY   ORENCIA CLICKJECT 125 MG/ML SOAJ Inject 1 Syringe into the skin once a week.   methocarbamol  (ROBAXIN ) 750 MG tablet TAKE 1 TABLET (750 MG TOTAL) BY MOUTH 2 (TWO) TIMES DAILY AS NEEDED FOR MUSCLE SPASMS. (Patient not taking: Reported on 06/04/2023)   ondansetron  (ZOFRAN  ODT) 4 MG disintegrating tablet Take 1 tablet (4 mg total) by mouth every 8 (eight) hours as needed for nausea or vomiting. (Patient not taking:  Reported on 06/04/2023)   promethazine -dextromethorphan (PROMETHAZINE -DM) 6.25-15 MG/5ML syrup Take 5 mLs by mouth 4 (four) times daily as needed for cough. (Patient not taking: Reported on 06/04/2023)   sulfamethoxazole -trimethoprim  (BACTRIM  DS) 800-160 MG tablet Take 1 tablet by mouth 2 (two) times daily. (Patient not taking: Reported on 06/04/2023)   tiZANidine  (ZANAFLEX ) 4 MG tablet Take 1 tablet (4 mg total) by mouth at bedtime. (Patient not taking: Reported on 06/04/2023)   VITAMIN D  PO Take by mouth. 4000IU (Patient not taking: Reported on 01/20/2023)   No facility-administered medications prior to visit.    Review of Systems All negative Except see HPI       Objective    BP 139/76 (BP Location: Left Arm, Patient Position: Sitting, Cuff Size: Normal)   Pulse 71   Resp 16   Ht 5' 6 (1.676 m)   Wt 152 lb (68.9 kg)   LMP 10/19/2020   SpO2 100%   BMI 24.53 kg/m     Physical Exam Vitals reviewed.  Constitutional:      Appearance: She is normal weight.  HENT:     Head: Normocephalic and atraumatic.     Right Ear: Ear canal and external ear normal.     Left Ear: Ear canal and external ear normal.     Nose: Congestion and rhinorrhea present.     Mouth/Throat:     Pharynx: Posterior oropharyngeal erythema present.     Comments: Postnasal drainage noted  Eyes:     General: No scleral icterus.       Right eye: No discharge.        Left eye: No discharge.     Extraocular Movements: Extraocular movements intact.     Pupils: Pupils are equal, round, and reactive to light.    Cardiovascular:     Rate and Rhythm: Normal rate and regular rhythm.  Pulmonary:     Effort: Pulmonary effort is normal.     Breath sounds: Normal breath sounds.  Abdominal:     General: Abdomen is flat. Bowel sounds are normal.     Palpations: Abdomen is soft.  Lymphadenopathy:     Cervical: No cervical adenopathy.   Neurological:     Mental Status: She is alert.      No results found for  any visits on 10/07/23.      Assessment & Plan Sinusitis Symptoms suggest viral sinusitis. Negative COVID-19 test. Differential includes viral infection or allergies. Congestive heart failure and rheumatoid arthritis complicate treatment. - Symptomatic treatment: hydration, warm salt gargles, hot tea with honey, Vicks VapoRub or aroma oils in shower. - Nasal  saline rinse, spray, or gel for congestion. - Prescribe Xyzal for allergy symptoms. - Use Flonase  post-nasal saline if tolerated, caution due to past epistaxis. - Antibiotics on hold for potential use if symptoms worsen or persist by weekend  Congestive Heart Failure Chronic and stable Limits use of medications like Sudafed. Continue with her current medication regimen  Rheumatoid Arthritis Chronic and stable On immunosuppressive therapy, complicating infection management. Caution with URI due to immunosuppression. Avoid systemic steroids due to immunocompromised status.  Depression and Anxiety Experiencing depression and anxiety, possibly exacerbated by menopause and stressors. On Cymbalta , potential dosage adjustment discussed. - Schedule follow-up with Dr. Verdia Glad within a month. - Consider Cymbalta  dosage adjustment if symptoms persist after addressing stressors.  General Health Maintenance Quit smoking three years ago, positive lifestyle change.  Follow-up Monitor sinusitis symptoms and manage depression and anxiety. - Schedule follow-up with Dr. Verdia Glad within a month for depression and anxiety. - Monitor sinusitis symptoms, use antibiotics if symptoms worsen or persist by weekend.    No orders of the defined types were placed in this encounter.   Return in about 4 weeks (around 11/04/2023) for chronic disease f/u/depression/anxiety.   The patient was advised to call back or seek an in-person evaluation if the symptoms worsen or if the condition fails to improve as anticipated.  I discussed the assessment and  treatment plan with the patient. The patient was provided an opportunity to ask questions and all were answered. The patient agreed with the plan and demonstrated an understanding of the instructions.  I, Gale Klar, PA-C have reviewed all documentation for this visit. The documentation on 10/07/2023  for the exam, diagnosis, procedures, and orders are all accurate and complete.  Blane Bunting, [facility], MMS [facility] [phone] (phone) [phone] (fax)  [facility]

## 2023-10-20 ENCOUNTER — Other Ambulatory Visit: Payer: Self-pay | Admitting: Family Medicine

## 2023-10-20 DIAGNOSIS — F339 Major depressive disorder, recurrent, unspecified: Secondary | ICD-10-CM

## 2023-11-01 ENCOUNTER — Other Ambulatory Visit: Payer: Self-pay | Admitting: Family Medicine

## 2023-11-01 DIAGNOSIS — N951 Menopausal and female climacteric states: Secondary | ICD-10-CM

## 2023-11-02 ENCOUNTER — Ambulatory Visit: Admitting: Family Medicine

## 2023-11-02 ENCOUNTER — Encounter: Payer: Self-pay | Admitting: Family Medicine

## 2023-11-02 VITALS — BP 128/92 | HR 76 | Temp 98.4°F | Ht 66.0 in | Wt 153.0 lb

## 2023-11-02 DIAGNOSIS — F419 Anxiety disorder, unspecified: Secondary | ICD-10-CM

## 2023-11-02 DIAGNOSIS — F339 Major depressive disorder, recurrent, unspecified: Secondary | ICD-10-CM

## 2023-11-02 DIAGNOSIS — J320 Chronic maxillary sinusitis: Secondary | ICD-10-CM | POA: Diagnosis not present

## 2023-11-02 DIAGNOSIS — Z1211 Encounter for screening for malignant neoplasm of colon: Secondary | ICD-10-CM

## 2023-11-02 DIAGNOSIS — F32A Depression, unspecified: Secondary | ICD-10-CM

## 2023-11-02 MED ORDER — BUPROPION HCL ER (XL) 150 MG PO TB24: 150.0000 mg | ORAL_TABLET | Freq: Every day | ORAL | 2 refills | Status: AC

## 2023-11-02 MED ORDER — BUSPIRONE HCL 5 MG PO TABS: 5.0000 mg | ORAL_TABLET | Freq: Two times a day (BID) | ORAL | 2 refills | Status: DC
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
## 2023-11-02 NOTE — Assessment & Plan Note (Signed)
 Depression and Anxiety, Chronic  Significant depression and anxiety with PHQ-9 score of 19 and anxiety score of 12. Current treatment with duloxetine  60 mg. Symptoms include feeling down, sadness, and anxiety. Discussed potential benefit of adding Wellbutrin  for depression and Buspar  for anxiety. - Prescribe Wellbutrin  150 mg once daily - Prescribe Buspar  5 mg twice daily as needed for anxiety - Continue duloxetine  60 mg daily

## 2023-11-02 NOTE — Patient Instructions (Signed)
 It was a pleasure to see you today!  Thank you for choosing [facility] for your primary care.   Today you were seen for your annual physical  Please review the attached information regarding helpful preventive health topics.   To keep you healthy, please keep in mind the following health maintenance items that you are due for:   Health Maintenance Due  Topic Date Due   COVID-19 Vaccine (1) Never done   Pneumococcal Vaccine 42-19 Years old (1 of 2 - PCV) Never done   Hepatitis B Vaccines (1 of 3 - 19+ 3-dose series) Never done   Zoster Vaccines- Shingrix (1 of 2) Never done   Colonoscopy  Never done   MAMMOGRAM  Never done     Best Wishes,   Dr. Lang

## 2023-11-09 ENCOUNTER — Telehealth: Payer: Self-pay

## 2023-11-09 NOTE — Telephone Encounter (Signed)
 Pt is planning on having neck and back surgery soon.  Now is not a good time for her to schedule.  Reminder letter mailed.  Thanks,  [place], CMA

## 2023-11-18 ENCOUNTER — Telehealth: Payer: Self-pay | Admitting: Cardiovascular Disease

## 2023-11-18 ENCOUNTER — Telehealth: Payer: Self-pay

## 2023-11-18 ENCOUNTER — Telehealth: Admitting: Family Medicine

## 2023-11-18 ENCOUNTER — Encounter: Payer: Self-pay | Admitting: Family Medicine

## 2023-11-18 DIAGNOSIS — I5022 Chronic systolic (congestive) heart failure: Secondary | ICD-10-CM | POA: Diagnosis not present

## 2023-11-18 DIAGNOSIS — F32A Depression, unspecified: Secondary | ICD-10-CM

## 2023-11-18 DIAGNOSIS — F419 Anxiety disorder, unspecified: Secondary | ICD-10-CM | POA: Diagnosis not present

## 2023-11-18 DIAGNOSIS — Z01818 Encounter for other preprocedural examination: Secondary | ICD-10-CM | POA: Diagnosis not present

## 2023-11-18 MED ORDER — BUSPIRONE HCL 5 MG PO TABS: 10.0000 mg | ORAL_TABLET | Freq: Two times a day (BID) | ORAL | 4 refills | Status: DC
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
## 2023-11-18 NOTE — Telephone Encounter (Signed)
   Pre-operative Risk Assessment    Patient Name: [name]  DOB: Dec 20, 1970 MRN: [id number]   Date of last office visit: 06/04/23 Date of next office visit: n/a   Request for Surgical Clearance    Procedure:  ACDF/TDR  Date of Surgery:  Clearance TBD                                Surgeon:  not indicated  Surgeon's Group or Practice Name:  [facility] Phone number:  [phone] Fax number:  [phone]   Type of Clearance Requested:   - Medical    Type of Anesthesia:  Not Indicated   Additional requests/questions:    Bonney Rosina Stamps   11/18/2023, 3:48 PM

## 2023-11-18 NOTE — Telephone Encounter (Signed)
 Preop tele appt now scheduled, med rec and consent done

## 2023-11-18 NOTE — Telephone Encounter (Signed)
   Name: Isabella Burch  DOB: Jan 07, 1971  MRN: [id number]  Primary Cardiologist: Deatrice Cage, MD   Preoperative team, please contact this patient and set up a phone call appointment for further preoperative risk assessment. Please obtain consent and complete medication review. Thank you for your help. Last seen by Medford Meager. NP on 06/04/2023.   I confirm that guidance regarding antiplatelet and oral anticoagulation therapy has been completed and, if necessary, noted below.  I also confirmed the patient resides in the state of  . As per [facility] Medical Board telemedicine laws, the patient must reside in the state in which the provider is licensed.   Lamarr Satterfield, NP 11/18/2023, 3:54 PM [facility]

## 2023-11-18 NOTE — Telephone Encounter (Signed)
  Patient Consent for Virtual Visit        Isabella Burch has provided verbal consent on 11/18/2023 for a virtual visit (video or telephone).   CONSENT FOR VIRTUAL VISIT FOR:  Isabella Burch  By participating in this virtual visit I agree to the following:  I hereby voluntarily request, consent and authorize [facility] and its employed or contracted physicians, physician assistants, nurse practitioners or other licensed health care professionals (the Practitioner), to provide me with telemedicine health care services (the "Services) as deemed necessary by the treating Practitioner. I acknowledge and consent to receive the Services by the Practitioner via telemedicine. I understand that the telemedicine visit will involve communicating with the Practitioner through live audiovisual communication technology and the disclosure of certain medical information by electronic transmission. I acknowledge that I have been given the opportunity to request an in-person assessment or other available alternative prior to the telemedicine visit and am voluntarily participating in the telemedicine visit.  I understand that I have the right to withhold or withdraw my consent to the use of telemedicine in the course of my care at any time, without affecting my right to future care or treatment, and that the Practitioner or I may terminate the telemedicine visit at any time. I understand that I have the right to inspect all information obtained and/or recorded in the course of the telemedicine visit and may receive copies of available information for a reasonable fee.  I understand that some of the potential risks of receiving the Services via telemedicine include:  Delay or interruption in medical evaluation due to technological equipment failure or disruption; Information transmitted may not be sufficient (e.g. poor resolution of images) to allow for appropriate medical decision making by the Practitioner;  and/or  In rare instances, security protocols could fail, causing a breach of personal health information.  Furthermore, I acknowledge that it is my responsibility to provide information about my medical history, conditions and care that is complete and accurate to the best of my ability. I acknowledge that Practitioner's advice, recommendations, and/or decision may be based on factors not within their control, such as incomplete or inaccurate data provided by me or distortions of diagnostic images or specimens that may result from electronic transmissions. I understand that the practice of medicine is not an exact science and that Practitioner makes no warranties or guarantees regarding treatment outcomes. I acknowledge that a copy of this consent can be made available to me via my patient portal [facility] MyChart), or I can request a printed copy by calling the office of [facility].    I understand that my insurance will be billed for this visit.   I have read or had this consent read to me. I understand the contents of this consent, which adequately explains the benefits and risks of the Services being provided via telemedicine.  I have been provided ample opportunity to ask questions regarding this consent and the Services and have had my questions answered to my satisfaction. I give my informed consent for the services to be provided through the use of telemedicine in my medical care

## 2023-11-25 ENCOUNTER — Ambulatory Visit: Attending: Cardiology

## 2023-11-25 DIAGNOSIS — Z01810 Encounter for preprocedural cardiovascular examination: Secondary | ICD-10-CM

## 2023-11-25 NOTE — Progress Notes (Signed)
 Virtual Visit via Telephone Note   Because of Isabella Burch co-morbid illnesses, she is at least at moderate risk for complications without adequate follow up.  This format is felt to be most appropriate for this patient at this time.  Due to technical limitations with video connection (technology), today's appointment will be conducted as an audio only telehealth visit, and [name] verbally agreed to proceed in this manner.   All issues noted in this document were discussed and addressed.  No physical exam could be performed with this format.  Evaluation Performed:  Preoperative cardiovascular risk assessment _____________   Date:  11/25/2023   Patient ID:  Isabella Burch, DOB 09-13-1970, MRN [id number] Patient Location:  Home Provider location:   Office  Primary Care Provider:  Sharma Coyer, MD Primary Cardiologist:  Deatrice Cage, MD  Chief Complaint / Patient Profile   53 y.o. y/o female with a h/o hypertension, Takotsubo cardiomyopathy, nonobstructive CAD, hyperlipidemia, e-cigarette use, palpitations who is pending ACDF/TDR and presents today for telephonic preoperative cardiovascular risk assessment.  History of Present Illness    Isabella Burch is a 53 y.o. female who presents via audio/video conferencing for a telehealth visit today.  Pt was last seen in cardiology clinic on 06/04/23 by Medford Meager, NP.  At that time [name] was doing well.  The patient is now pending procedure as outlined above. Since her last visit, she has been having some chest pains/burning in her chest.  She has had to get on some new anxiety medication. She has had some burning sensation and radiated in between her shoulder blades. She was unsure if it was her heart. She saw her PCP. BP has been running high. She want to come into our office for an EKG and further evaluation.   No medications indicated as needing held.   Past Medical History    Past Medical History:  Diagnosis  Date   Anxiety    Ascending aorta dilatation (HCC)    a. 11/2020 CTA chest: 3.8cm asc Ao.   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 11/2020 Echo: EF 20-25%; b. 02/2021 Echo: EF 55-60%, no rwma; c. 04/2022 Echo: EF 55-60%, GrI DD, nl RV fxn, and no significant valvular dzs.   Extensor tendon rupture, non-traumatic, hand and wrist    right   Heart murmur    History of kidney stones    Hypertension    Nonobstructive CAD (coronary artery disease)    a. 11/2020 NSTEMI/Cath: LM nl, LAD 20m, D1/2 nl, LCX min irregs, RCA nl, RPDA/RPAV nl.   Rheumatoid arthritis (HCC) dx 2006   BILATERAL HANDS AND RIGHT ANKLE   Takotsubo cardiomyopathy    a. 11/2020 Echo: EF 20-25%, mid-apical ant, inf, and apical AK. Nl RV size/fxn. Mild MR; b. 11/2020 Cath: nonobs dzs; c. 02/2021 Echo: EF 55-60%; c. 04/2022 Echo: EF 55-60%.   Tobacco abuse    Past Surgical History:  Procedure Laterality Date   CYSTOSCOPY WITH URETEROSCOPY, STONE BASKETRY AND STENT PLACEMENT  2011   LEFT HEART CATH AND CORONARY ANGIOGRAPHY N/A 12/10/2020   Procedure: LEFT HEART CATH AND CORONARY ANGIOGRAPHY;  Surgeon: Cage Deatrice LABOR, MD;  Location: [facility];  Service: Cardiovascular;  Laterality: N/A;   LEFT WRIST EXTENSOR TENDON REPAIR     TENDON TRANSFER Right 11/09/2014   Procedure: RIGHT THUMB EIP TO EPL TENDON TRANSFER;  Surgeon: Prentice Pagan, MD;  Location: [facility] [place];  Service: Orthopedics;  Laterality: Right;   TUBAL LIGATION      Allergies  Allergies  Allergen Reactions   Adalimumab Other (See Comments)    kidney dysfunction   Etanercept Swelling and Other (See Comments)    Home Medications    Prior to Admission medications   Medication Sig Start Date End Date Taking? Authorizing Provider  acetaminophen  (TYLENOL ) 500 MG tablet Take 500 mg by mouth every 6 (six) hours as needed for moderate pain, mild pain, headache or fever.    [provider]  aspirin   EC 81 MG EC tablet Take 1 tablet (81 mg total) by mouth daily. Swallow whole. 12/12/20   Patsy Lenis, MD  atorvastatin  (LIPITOR ) 40 MG tablet TAKE 1 TABLET BY MOUTH EVERY DAY 07/27/23   Vivienne Lonni Ingle, NP  buPROPion  (WELLBUTRIN  XL) 150 MG 24 hr tablet Take 1 tablet (150 mg total) by mouth daily. 11/02/23   Simmons-Robinson, Rockie, MD  busPIRone  (BUSPAR ) 5 MG tablet Take 2 tablets (10 mg total) by mouth 2 (two) times daily. 11/18/23   Simmons-Robinson, Makiera, MD  carvedilol  (COREG ) 3.125 MG tablet TAKE 1 TABLET BY MOUTH 2 TIMES DAILY. -- STOP METOPROLOL  08/28/23   End, Lonni, MD  DULoxetine  (CYMBALTA ) 60 MG capsule TAKE 1 CAPSULE BY MOUTH EVERY DAY 10/20/23   Simmons-Robinson, Rockie, MD  hydroxychloroquine (PLAQUENIL) 200 MG tablet Take one tablet TWICE daily weekdays. Take one tablet ONCE daily weekends. Take with food. 12/02/22   [provider]  leflunomide (ARAVA) 10 MG tablet TAKE 1 TABLET BY MOUTH ONCE A DAY 07/17/14   [provider]  lisinopril  (ZESTRIL ) 40 MG tablet TAKE 1 TABLET BY MOUTH EVERY DAY 07/24/23   Vivienne Lonni Ingle, NP  ORENCIA CLICKJECT 125 MG/ML SOAJ Inject 1 Syringe into the skin once a week. 05/30/19   [provider]    Physical Exam    Vital Signs:  Bascom JONELLE Satterfield does not have vital signs available for review today.  135/94  Given telephonic nature of communication, physical exam is limited. AAOx3. NAD. Normal affect.  Speech and respirations are unlabored.  Accessory Clinical Findings    None  Assessment & Plan    1.  Preoperative Cardiovascular Risk Assessment:   Since she is having chest pain today and we are unsure if it is cardiac, she will need an in person visit for further evaluation.  I have no charge for phone call today and we will work on getting her scheduled in the clinic prior to her clearance.  Time:   Today, I have spent 8 minutes with the patient with telehealth technology discussing medical  history, symptoms, and management plan.     Orren LOISE Fabry, PA-C  11/25/2023, 8:06 AM

## 2023-11-25 NOTE — Telephone Encounter (Signed)
 Scheduled 08/07 with JAYSON Meager

## 2023-11-26 ENCOUNTER — Encounter: Payer: Self-pay | Admitting: Nurse Practitioner

## 2023-11-26 ENCOUNTER — Ambulatory Visit: Attending: Nurse Practitioner | Admitting: Nurse Practitioner

## 2023-11-26 VITALS — BP 100/80 | HR 84 | Ht 66.0 in | Wt 151.5 lb

## 2023-11-26 DIAGNOSIS — I2510 Atherosclerotic heart disease of native coronary artery without angina pectoris: Secondary | ICD-10-CM | POA: Diagnosis not present

## 2023-11-26 DIAGNOSIS — Z01810 Encounter for preprocedural cardiovascular examination: Secondary | ICD-10-CM | POA: Diagnosis not present

## 2023-11-26 DIAGNOSIS — I5032 Chronic diastolic (congestive) heart failure: Secondary | ICD-10-CM

## 2023-11-26 DIAGNOSIS — I10 Essential (primary) hypertension: Secondary | ICD-10-CM

## 2023-11-26 DIAGNOSIS — I5181 Takotsubo syndrome: Secondary | ICD-10-CM | POA: Diagnosis not present

## 2023-11-26 DIAGNOSIS — E785 Hyperlipidemia, unspecified: Secondary | ICD-10-CM

## 2023-11-26 NOTE — Progress Notes (Signed)
 Office Visit    Patient Name: Isabella Burch Date of Encounter: 11/26/2023  Primary Care Provider:  Sharma Coyer, Burch Primary Cardiologist:  Isabella Cage, Burch    Chief Complaint    53 y.o. female with a history of non-STEMI and nonobstructive CAD (August 2022), Takotsubo cardiomyopathy, heart failure with improved ejection fraction, hypertension, hyperlipidemia, remote tobacco abuse, rheumatoid arthritis, and anxiety, who presents for follow-up related to preoperative evaluation.  Past Medical History   Subjective   Past Medical History:  Diagnosis Date   Anxiety    Ascending aorta dilatation (HCC)    a. 11/2020 CTA chest: 3.8cm asc Ao.   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 11/2020 Echo: EF 20-25%; b. 02/2021 Echo: EF 55-60%, no rwma; c. 04/2022 Echo: EF 55-60%, GrI DD, nl RV fxn, and no significant valvular dzs.   Extensor tendon rupture, non-traumatic, hand and wrist    right   Heart murmur    History of kidney stones    Hypertension    Nonobstructive CAD (coronary artery disease)    a. 11/2020 NSTEMI/Cath: LM nl, LAD 51m, D1/2 nl, LCX min irregs, RCA nl, RPDA/RPAV nl.   Rheumatoid arthritis (HCC) dx 2006   BILATERAL HANDS AND RIGHT ANKLE   Takotsubo cardiomyopathy    a. 11/2020 Echo: EF 20-25%, mid-apical ant, inf, and apical AK. Nl RV size/fxn. Mild MR; b. 11/2020 Cath: nonobs dzs; c. 02/2021 Echo: EF 55-60%; c. 04/2022 Echo: EF 55-60%.   Tobacco abuse    Past Surgical History:  Procedure Laterality Date   CYSTOSCOPY WITH URETEROSCOPY, STONE BASKETRY AND STENT PLACEMENT  2011   LEFT HEART CATH AND CORONARY ANGIOGRAPHY N/A 12/10/2020   Procedure: LEFT HEART CATH AND CORONARY ANGIOGRAPHY;  Surgeon: Isabella Burch;  Location: [facility];  Service: Cardiovascular;  Laterality: N/A;   LEFT WRIST EXTENSOR TENDON REPAIR     TENDON TRANSFER Right 11/09/2014   Procedure: RIGHT THUMB EIP TO EPL TENDON TRANSFER;   Surgeon: Isabella Burch;  Location: [facility] [place];  Service: Orthopedics;  Laterality: Right;   TUBAL LIGATION      Allergies  Allergies  Allergen Reactions   Adalimumab Other (See Comments)    kidney dysfunction   Etanercept Other (See Comments) and Swelling       History of Present Illness      53 y.o. y/o female w/ the above PMH including non-obstructive CAD, takotsubo cardiomyopathy, HFimpEF, HTN, HL, anxiety, RA, and remote tob abuse.  In 11/2020, she was admitted to [facility] w/ chest pain.  ECG showed prior anteroseptal infarct and 1mm ST elevation in V1 & V2, w/ more subtle ST elevation in V3-V6.  CTA of chest was neg for PE. HsTrop rose to 3284.  Echo showed an EF of 20-25% with mid-apical anterior, inferior, and apical akinesis consistent with a stress-induced cardiomyopathy. Cath revealed mild non-obstructive dzs, and she was medically managed.  F/u echo in 02/2021 showed improvement in EF to 55-60% w/ mild MR.  In the setting of DOE, repeat echo in 04/2022 was performed and showed EF of 55-60%, GrI DD, nl RV fxn, and no significant valvular dzs.  In the follow-up 2024, she started noticing elevated blood pressures.  Lisinopril  was titrated to 40 mg daily and metoprolol  was transitioned to carvedilol  with improvement in blood pressures.       Isabella Burch is doing well at February 2025 visit noting only a rare skipped  beat/palpitation.  Since her last visit, she has done reasonably well from a cardiac standpoint without chest pain or significant dyspnea on exertion.  She does note that when walking up 3 flights of stairs, she may feel little short of breath at the top and occasionally has to take a rest before getting there but has no issues walking up 1-2 flights of stairs or walking long distances on flat surfaces.  She continues to use an e-cigarette, which will now last her up to a month, and she does hope to quit soon.  She denies palpitations, PND, orthopnea, dizziness,  syncope, edema, or early satiety.  She has been seen by Ortho in the setting of severe neck pain with numbness and tingling of her left arm and left leg, which has been going on since last year when she suffered a work-related injury and boxes fell on her head and neck.  She is now pending anterior cervical discectomy and fusion w/ total disc replacement. Objective   Home Medications    Current Outpatient Medications  Medication Sig Dispense Refill   acetaminophen  (TYLENOL ) 500 MG tablet Take 500 mg by mouth every 6 (six) hours as needed for moderate pain, mild pain, headache or fever.     aspirin  EC 81 MG EC tablet Take 1 tablet (81 mg total) by mouth daily. Swallow whole. 30 tablet 11   atorvastatin  (LIPITOR ) 40 MG tablet TAKE 1 TABLET BY MOUTH EVERY DAY 90 tablet 3   buPROPion  (WELLBUTRIN  XL) 150 MG 24 hr tablet Take 1 tablet (150 mg total) by mouth daily. 90 tablet 2   busPIRone  (BUSPAR ) 5 MG tablet Take 2 tablets (10 mg total) by mouth 2 (two) times daily. 120 tablet 4   carvedilol  (COREG ) 3.125 MG tablet TAKE 1 TABLET BY MOUTH 2 TIMES DAILY. -- STOP METOPROLOL  180 tablet 2   DULoxetine  (CYMBALTA ) 60 MG capsule TAKE 1 CAPSULE BY MOUTH EVERY DAY 90 capsule 2   gentamicin ointment (GARAMYCIN) 0.1 % Apply 1 Application topically 3 (three) times daily.     hydroxychloroquine (PLAQUENIL) 200 MG tablet Take one tablet TWICE daily weekdays. Take one tablet ONCE daily weekends. Take with food.     leflunomide (ARAVA) 10 MG tablet TAKE 1 TABLET BY MOUTH ONCE A DAY     lisinopril  (ZESTRIL ) 40 MG tablet TAKE 1 TABLET BY MOUTH EVERY DAY 90 tablet 3   ORENCIA CLICKJECT 125 MG/ML SOAJ Inject 1 Syringe into the skin once a week.     sulfamethoxazole -trimethoprim  (BACTRIM  DS) 800-160 MG tablet Take 1 tablet by mouth 2 (two) times daily.     No current facility-administered medications for this visit.     Physical Exam    VS:  BP 100/80 (BP Location: Right Arm, Patient Position: Sitting, Cuff Size:  Normal)   Pulse 84   Ht 5' 6 (1.676 m)   Wt 151 lb 8 oz (68.7 kg)   LMP 10/19/2020   SpO2 96%   BMI 24.45 kg/m  , BMI Body mass index is 24.45 kg/m.          GEN: Well nourished, well developed, in no acute distress. HEENT: normal. Neck: Supple, no JVD, carotid bruits, or masses. Cardiac: RRR, no murmurs, rubs, or gallops. No clubbing, cyanosis, edema.  Radials 2+/PT 2+ and equal bilaterally.  Respiratory:  Respirations regular and unlabored, clear to auscultation bilaterally. GI: Soft, nontender, nondistended, BS + x 4. MS: no deformity or atrophy. Skin: warm and dry, no rash. Neuro:  Strength  and sensation are intact. Psych: Normal affect.  Accessory Clinical Findings    ECG personally reviewed by me today - EKG Interpretation Date/Time:  Thursday November 26 2023 13:52:14 EDT Ventricular Rate:  84 PR Interval:  144 QRS Duration:  76 QT Interval:  380 QTC Calculation: 449 R Axis:   -48  Text Interpretation: Normal sinus rhythm Biatrial enlargement Left axis deviation Possible Anteroseptal infarct , age undetermined Confirmed by Vivienne Bruckner [phone]) on 11/26/2023 1:59:41 PM  -leftward axis is new but otherwise no acute changes.  Lab Results  Component Value Date   WBC 4.9 02/20/2023   HGB 13.2 02/20/2023   HCT 39.1 02/20/2023   MCV 96.1 02/20/2023   PLT 300 02/20/2023   Lab Results  Component Value Date   CREATININE 0.85 02/20/2023   BUN 10 02/20/2023   NA 141 02/20/2023   K 3.4 (L) 02/20/2023   CL 107 02/20/2023   CO2 27 02/20/2023   Lab Results  Component Value Date   ALT 46 (H) 07/29/2022   AST 35 07/29/2022   ALKPHOS 118 07/29/2022   BILITOT 0.3 07/29/2022   Lab Results  Component Value Date   CHOL 155 07/29/2022   HDL 56 07/29/2022   LDLCALC 80 07/29/2022   TRIG 108 07/29/2022   CHOLHDL 2.8 07/29/2022    Lab Results  Component Value Date   HGBA1C 5.8 (H) 07/29/2022   Lab Results  Component Value Date   TSH 1.240 07/29/2022        Assessment & Plan    1.  Preoperative cardiovascular examination: Patient with a history of Takotsubo cardiomyopathy and chronic heart failure with improved EF at 55 to 60% with grade 1 diastolic dysfunction by echo in January 2024.  Prior diagnostic catheterization showed mild, nonobstructive CAD.  She continues to do well from a cardiac standpoint without chest pain or dyspnea.  She is now pending anterior cervical discectomy and fusion with total disc replacement in the setting of prior whiplash injury after boxes fell on her head at work last year.  This has resulted in ongoing neck pain as well as numbness in her left arm and leg.  From a preoperative standpoint, she is capable of achieving 7.99 METs per DASI and her RCRI calculates to 0.9% risk of MACE related to noncardiac surgery.  In the setting, she may proceed with neck surgery as planned without any additional ischemic evaluation.  Given history of stress-induced cardiomyopathy, this would be best performed in a hospital setting.  She may hold aspirin  for 7 days preoperatively with plan to resume postoperatively, when felt to be feasible by the surgical team.  She should continue beta-blocker and statin throughout the perioperative period.  2.  Takotsubo cardiomyopathy/chronic heart failure with improved ejection fraction: As above, suffered non-STEMI in August 2022 with diagnostic catheterization showing mild, nonobstructive CAD.  EF was initially as low as 20 to 25% but recovered to 55-60% by echo November 2022 with stable EF of 55 to 60% by echo in 2024.  She has done well and is euvolemic on examination.  She remains on beta-blocker and ACE inhibitor therapy.  3.  Nonobstructive CAD: Catheterization August 2022 without significant disease.  She remains on aspirin , statin, and beta-blocker therapy.  4.  Primary hypertension: Blood pressure is soft but stable today at 100/80.  She is asymptomatic.  She remains on beta-blocker and ACE  inhibitor therapy.  5.  Hyperlipidemia: LDL of 80 last year.  She is on atorvastatin  therapy.  As previously noted, would consider titrating atorvastatin  versus adding ezetimibe if LDL remains greater than 70 on follow-up lipids when checked by primary care this year.  Activity currently limited by neck/arm/leg pain and numbness.  Encouraged diet rich in fiber, fruits and vegetables.  6.  E-cigarette use: Previous smoke cigarettes but quit at the time of her event 2022.  Has been using e-cigarette which now is lasting about a month.  Complete cessation advised.  7.  Palpitations: Quiescent.  Continue beta-blocker.  8.  Disposition: Follow-up in 6 months or sooner if necessary.  Lonni Meager, NP 11/26/2023, 2:00 PM

## 2023-11-26 NOTE — Patient Instructions (Signed)
 Medication Instructions:  Your physician recommends that you continue on your current medications as directed. Please refer to the Current Medication list given to you today.   *If you need a refill on your cardiac medications before your next appointment, please call your pharmacy*  Lab Work: None ordered at this time   Follow-Up: At [facility], you and your health needs are our priority.  As part of our continuing mission to provide you with exceptional heart care, our providers are all part of one team.  This team includes your primary Cardiologist (physician) and Advanced Practice Providers or APPs (Physician Assistants and Nurse Practitioners) who all work together to provide you with the care you need, when you need it.  Your next appointment:   6 month(s)  Provider:   You may see Deatrice Cage, MD or one of the following Advanced Practice Providers on your designated Care Team:   Lonni Meager, NP Lesley Maffucci, PA-C Bernardino Bring, PA-C Cadence Brussels, PA-C Tylene Lunch, NP Barnie Hila, NP

## 2023-12-09 ENCOUNTER — Telehealth: Payer: Self-pay | Admitting: Adult Health

## 2023-12-09 NOTE — Progress Notes (Signed)
   Patient Name: Isabella Burch  DOB: December 07, 1970 MRN: [id number]  Primary Cardiologist: Deatrice Cage, MD  Chart reviewed as part of pre-operative protocol coverage. Given past medical history and time since last visit, based on ACC/AHA guidelines, KEZIYAH KNEALE is at acceptable risk for the planned procedure without further cardiovascular testing.   Per Lonni Meager, NP on 11/26/2023  Preoperative cardiovascular examination: Patient with a history of Takotsubo cardiomyopathy and chronic heart failure with improved EF at 55 to 60% with grade 1 diastolic dysfunction by echo in January 2024.  Prior diagnostic catheterization showed mild, nonobstructive CAD.  She continues to do well from a cardiac standpoint without chest pain or dyspnea.  She is now pending anterior cervical discectomy and fusion with total disc replacement in the setting of prior whiplash injury after boxes fell on her head at work last year.  This has resulted in ongoing neck pain as well as numbness in her left arm and leg.  From a preoperative standpoint, she is capable of achieving 7.99 METs per DASI and her RCRI calculates to 0.9% risk of MACE related to noncardiac surgery.  In the setting, she may proceed with neck surgery as planned without any additional ischemic evaluation.  Given history of stress-induced cardiomyopathy, this would be best performed in a hospital setting.  She may hold aspirin  for 7 days preoperatively with plan to resume postoperatively, when felt to be feasible by the surgical team.  She should continue beta-blocker and statin throughout the perioperative period.   The patient was advised that if she develops new symptoms prior to surgery to contact our office to arrange for a follow-up visit, and she verbalized understanding.  I will route this recommendation to the requesting party via Epic fax function and remove from pre-op pool.  Please call with questions.  Lamarr Satterfield, NP 12/09/2023, 11:49  AM

## 2023-12-09 NOTE — Progress Notes (Signed)
   Patient Name: Isabella Burch  DOB: [birth year] MRN: [id number]  Primary Cardiologist: Deatrice Cage, MD  Chart reviewed as part of pre-operative protocol coverage. Given past medical history and time since last visit, based on ACC/AHA guidelines, Isabella Burch is at acceptable risk for the planned procedure without further cardiovascular testing.   Per Lonni Meager, NP on 11/26/2023  Preoperative cardiovascular examination: Patient with a history of Takotsubo cardiomyopathy and chronic heart failure with improved EF at 55 to 60% with grade 1 diastolic dysfunction by echo in January 2024.  Prior diagnostic catheterization showed mild, nonobstructive CAD.  She continues to do well from a cardiac standpoint without chest pain or dyspnea.  She is now pending anterior cervical discectomy and fusion with total disc replacement in the setting of prior whiplash injury after boxes fell on her head at work last year.  This has resulted in ongoing neck pain as well as numbness in her left arm and leg.  From a preoperative standpoint, she is capable of achieving 7.99 METs per DASI and her RCRI calculates to 0.9% risk of MACE related to noncardiac surgery.  In the setting, she may proceed with neck surgery as planned without any additional ischemic evaluation.  Given history of stress-induced cardiomyopathy, this would be best performed in a hospital setting.  She may hold aspirin  for 7 days preoperatively with plan to resume postoperatively, when felt to be feasible by the surgical team.  She should continue beta-blocker and statin throughout the perioperative period.   The patient was advised that if she develops new symptoms prior to surgery to contact our office to arrange for a follow-up visit, and she verbalized understanding.  I will route this recommendation to the requesting party via Epic fax function and remove from pre-op pool.  Please call with questions.  Lamarr Satterfield, NP 12/09/2023, 11:41  AM

## 2023-12-11 ENCOUNTER — Ambulatory Visit (HOSPITAL_COMMUNITY): Payer: Self-pay | Admitting: Physician Assistant

## 2023-12-16 ENCOUNTER — Ambulatory Visit: Admitting: Family Medicine

## 2023-12-16 NOTE — Progress Notes (Deleted)
 Established patient visit   Patient: Isabella Burch   DOB: 1970/08/05   53 y.o. Female  MRN: [id number] Visit Date: 12/16/2023  Today's healthcare provider: Rockie Agent, MD   No chief complaint on file.  Subjective       Discussed the use of AI scribe software for clinical note transcription with the patient, who gave verbal consent to proceed.  History of Present Illness      Past Medical History:  Diagnosis Date   Anxiety    Ascending aorta dilatation (HCC)    a. 11/2020 CTA chest: 3.8cm asc Ao.   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 11/2020 Echo: EF 20-25%; b. 02/2021 Echo: EF 55-60%, no rwma; c. 04/2022 Echo: EF 55-60%, GrI DD, nl RV fxn, and no significant valvular dzs.   Extensor tendon rupture, non-traumatic, hand and wrist    right   Heart murmur    History of kidney stones    Hypertension    Nonobstructive CAD (coronary artery disease)    a. 11/2020 NSTEMI/Cath: LM nl, LAD 15m, D1/2 nl, LCX min irregs, RCA nl, RPDA/RPAV nl.   Rheumatoid arthritis (HCC) dx 2006   BILATERAL HANDS AND RIGHT ANKLE   Takotsubo cardiomyopathy    a. 11/2020 Echo: EF 20-25%, mid-apical ant, inf, and apical AK. Nl RV size/fxn. Mild MR; b. 11/2020 Cath: nonobs dzs; c. 02/2021 Echo: EF 55-60%; c. 04/2022 Echo: EF 55-60%.   Tobacco abuse     Medications: Outpatient Medications Prior to Visit  Medication Sig   acetaminophen  (TYLENOL ) 500 MG tablet Take 500 mg by mouth every 6 (six) hours as needed for moderate pain, mild pain, headache or fever.   aspirin  EC 81 MG EC tablet Take 1 tablet (81 mg total) by mouth daily. Swallow whole.   atorvastatin  (LIPITOR ) 40 MG tablet TAKE 1 TABLET BY MOUTH EVERY DAY   buPROPion  (WELLBUTRIN  XL) 150 MG 24 hr tablet Take 1 tablet (150 mg total) by mouth daily.   busPIRone  (BUSPAR ) 5 MG tablet Take 2 tablets (10 mg total) by mouth 2 (two) times daily.   carvedilol  (COREG ) 3.125 MG tablet TAKE 1 TABLET BY  MOUTH 2 TIMES DAILY. -- STOP METOPROLOL    DULoxetine  (CYMBALTA ) 60 MG capsule TAKE 1 CAPSULE BY MOUTH EVERY DAY   gentamicin ointment (GARAMYCIN) 0.1 % Apply 1 Application topically 3 (three) times daily.   hydroxychloroquine (PLAQUENIL) 200 MG tablet Take one tablet TWICE daily weekdays. Take one tablet ONCE daily weekends. Take with food.   leflunomide (ARAVA) 10 MG tablet TAKE 1 TABLET BY MOUTH ONCE A DAY   lisinopril  (ZESTRIL ) 40 MG tablet TAKE 1 TABLET BY MOUTH EVERY DAY   ORENCIA CLICKJECT 125 MG/ML SOAJ Inject 1 Syringe into the skin once a week.   sulfamethoxazole -trimethoprim  (BACTRIM  DS) 800-160 MG tablet Take 1 tablet by mouth 2 (two) times daily.   No facility-administered medications prior to visit.    Review of Systems  Last CBC Lab Results  Component Value Date   WBC 4.9 02/20/2023   HGB 13.2 02/20/2023   HCT 39.1 02/20/2023   MCV 96.1 02/20/2023   MCH 32.4 02/20/2023   RDW 12.0 02/20/2023   PLT 300 02/20/2023   Last metabolic panel Lab Results  Component Value Date   GLUCOSE 119 (H) 02/20/2023   NA 141 02/20/2023   K 3.4 (L) 02/20/2023   CL 107 02/20/2023   CO2 27 02/20/2023   BUN 10  02/20/2023   CREATININE 0.85 02/20/2023   GFRNONAA >60 02/20/2023   CALCIUM  8.5 (L) 02/20/2023   PROT 6.3 07/29/2022   ALBUMIN 4.2 07/29/2022   LABGLOB 2.1 07/29/2022   AGRATIO 2.0 07/29/2022   BILITOT 0.3 07/29/2022   ALKPHOS 118 07/29/2022   AST 35 07/29/2022   ALT 46 (H) 07/29/2022   ANIONGAP 7 02/20/2023   Last lipids Lab Results  Component Value Date   CHOL 155 07/29/2022   HDL 56 07/29/2022   LDLCALC 80 07/29/2022   TRIG 108 07/29/2022   CHOLHDL 2.8 07/29/2022   Last hemoglobin A1c Lab Results  Component Value Date   HGBA1C 5.8 (H) 07/29/2022   Last thyroid  functions Lab Results  Component Value Date   TSH 1.240 07/29/2022     {See past labs  Heme  Chem  Endocrine  Serology  Results Review (optional):1}   Objective    LMP 10/19/2020  BP  Readings from Last 3 Encounters:  11/26/23 100/80  11/02/23 (!) 128/92  10/07/23 139/76   Wt Readings from Last 3 Encounters:  11/26/23 151 lb 8 oz (68.7 kg)  11/02/23 153 lb (69.4 kg)  10/07/23 152 lb (68.9 kg)    {See vitals history (optional):1}    Physical Exam  ***  No results found for any visits on 12/16/23.  Assessment & Plan     Problem List Items Addressed This Visit     Depression, recurrent (HCC) - Primary   Other Visit Diagnoses       Anxiety and depression           Assessment and Plan Assessment & Plan      No follow-ups on file.         Rockie Agent, MD  [facility] [phone] (phone) [phone] (fax)  [facility]

## 2023-12-25 NOTE — Pre-Procedure Instructions (Signed)
 Surgical Instructions   Your procedure is scheduled on December 31, 2023. Report to [facility] Main Entrance A at 7:30 A.M., then check in with the Admitting office. Any questions or running late day of surgery: call [phone]  Questions prior to your surgery date: call [phone], Monday-Friday, 8am-4pm. If you experience any cold or flu symptoms such as cough, fever, chills, shortness of breath, etc. between now and your scheduled surgery, please notify us  at the above number.     Remember:  Do not eat after midnight the night before your surgery  You may drink clear liquids until 6:30 AM the morning of your surgery.   Clear liquids allowed are: Water, Non-Citrus Juices (without pulp), Carbonated Beverages, Clear Tea (no milk, honey, etc.), Black Coffee Only (NO MILK, CREAM OR POWDERED CREAMER of any kind), and Gatorade.    Take these medicines the morning of surgery with A SIP OF WATER: atorvastatin  (LIPITOR )  buPROPion  (WELLBUTRIN  XL)  busPIRone  (BUSPAR )  carvedilol  (COREG )  DULoxetine  (CYMBALTA )    May take these medicines IF NEEDED: acetaminophen  (TYLENOL )    Please follow your prescribing physician's instructions on if/when to stop taking your hydroxychloroquine (PLAQUENIL), leflunomide (ARAVA) and ORENCIA.  Follow your surgeon's instructions on when to stop Aspirin .  If no instructions were given by your surgeon then you will need to call the office to get those instructions.     One week prior to surgery, STOP taking any Aleve, Naproxen, Ibuprofen, Motrin, Advil, Goody's, BC's, all herbal medications, fish oil, and non-prescription vitamins.                     Do NOT Smoke (Tobacco/Vaping) for 24 hours prior to your procedure.  If you use a CPAP at night, you may bring your mask/headgear for your overnight stay.   You will be asked to remove any contacts, glasses, piercing's, hearing aid's, dentures/partials prior to surgery. Please bring cases for these  items if needed.    Patients discharged the day of surgery will not be allowed to drive home, and someone needs to stay with them for 24 hours.  SURGICAL WAITING ROOM VISITATION Patients may have no more than 2 support people in the waiting area - these visitors may rotate.   Pre-op nurse will coordinate an appropriate time for 1 ADULT support person, who may not rotate, to accompany patient in pre-op.  Children under the age of 11 must have an adult with them who is not the patient and must remain in the main waiting area with an adult.  If the patient needs to stay at the hospital during part of their recovery, the visitor guidelines for inpatient rooms apply.  Please refer to the [facility] website for the visitor guidelines for any additional information.   If you received a COVID test during your pre-op visit  it is requested that you wear a mask when out in public, stay away from anyone that may not be feeling well and notify your surgeon if you develop symptoms. If you have been in contact with anyone that has tested positive in the last 10 days please notify you surgeon.      Pre-operative 5 CHG Bathing Instructions   You can play a key role in reducing the risk of infection after surgery. Your skin needs to be as free of germs as possible. You can reduce the number of germs on your skin by washing with CHG (chlorhexidine  gluconate) soap before surgery. CHG is an  antiseptic soap that kills germs and continues to kill germs even after washing.   DO NOT use if you have an allergy to chlorhexidine /CHG or antibacterial soaps. If your skin becomes reddened or irritated, stop using the CHG and notify one of our RNs at [phone].   Please shower with the CHG soap starting 4 days before surgery using the following schedule:     Please keep in mind the following:  DO NOT shave, including legs and underarms, starting the day of your first shower.   You may shave your face at any  point before/day of surgery.  Place clean sheets on your bed the day you start using CHG soap. Use a clean washcloth (not used since being washed) for each shower. DO NOT sleep with pets once you start using the CHG.   CHG Shower Instructions:  Wash your face and private area with normal soap. If you choose to wash your hair, wash first with your normal shampoo.  After you use shampoo/soap, rinse your hair and body thoroughly to remove shampoo/soap residue.  Turn the water OFF and apply about 3 tablespoons (45 ml) of CHG soap to a CLEAN washcloth.  Apply CHG soap ONLY FROM YOUR NECK DOWN TO YOUR TOES (washing for 3-5 minutes)  DO NOT use CHG soap on face, private areas, open wounds, or sores.  Pay special attention to the area where your surgery is being performed.  If you are having back surgery, having someone wash your back for you may be helpful. Wait 2 minutes after CHG soap is applied, then you may rinse off the CHG soap.  Pat dry with a clean towel  Put on clean clothes/pajamas   If you choose to wear lotion, please use ONLY the CHG-compatible lotions that are listed below.  Additional instructions for the day of surgery: DO NOT APPLY any lotions, deodorants, cologne, or perfumes.   Do not bring valuables to the hospital. [facility] is not responsible for any belongings/valuables. Do not wear nail polish, gel polish, artificial nails, or any other type of covering on natural nails (fingers and toes) Do not wear jewelry or makeup Put on clean/comfortable clothes.  Please brush your teeth.  Ask your nurse before applying any prescription medications to the skin.     CHG Compatible Lotions   Aveeno Moisturizing lotion  Cetaphil Moisturizing Cream  Cetaphil Moisturizing Lotion  Clairol Herbal Essence Moisturizing Lotion, Dry Skin  Clairol Herbal Essence Moisturizing Lotion, Extra Dry Skin  Clairol Herbal Essence Moisturizing Lotion, Normal Skin  Curel Age Defying Therapeutic  Moisturizing Lotion with Alpha Hydroxy  Curel Extreme Care Body Lotion  Curel Soothing Hands Moisturizing Hand Lotion  Curel Therapeutic Moisturizing Cream, Fragrance-Free  Curel Therapeutic Moisturizing Lotion, Fragrance-Free  Curel Therapeutic Moisturizing Lotion, Original Formula  Eucerin Daily Replenishing Lotion  Eucerin Dry Skin Therapy Plus Alpha Hydroxy Crme  Eucerin Dry Skin Therapy Plus Alpha Hydroxy Lotion  Eucerin Original Crme  Eucerin Original Lotion  Eucerin Plus Crme Eucerin Plus Lotion  Eucerin TriLipid Replenishing Lotion  Keri Anti-Bacterial Hand Lotion  Keri Deep Conditioning Original Lotion Dry Skin Formula Softly Scented  Keri Deep Conditioning Original Lotion, Fragrance Free Sensitive Skin Formula  Keri Lotion Fast Absorbing Fragrance Free Sensitive Skin Formula  Keri Lotion Fast Absorbing Softly Scented Dry Skin Formula  Keri Original Lotion  Keri Skin Renewal Lotion Keri Silky Smooth Lotion  Keri Silky Smooth Sensitive Skin Lotion  Nivea Body Creamy Conditioning Oil  Nivea Body Extra Enriched Lotion  Nivea Body Original Lotion  Nivea Body Sheer Moisturizing Lotion Nivea Crme  Nivea Skin Firming Lotion  NutraDerm 30 Skin Lotion  NutraDerm Skin Lotion  NutraDerm Therapeutic Skin Cream  NutraDerm Therapeutic Skin Lotion  ProShield Protective Hand Cream  Provon moisturizing lotion  Please read over the following fact sheets that you were given.

## 2023-12-28 ENCOUNTER — Other Ambulatory Visit: Payer: Self-pay

## 2023-12-28 ENCOUNTER — Encounter (HOSPITAL_COMMUNITY)
Admission: RE | Admit: 2023-12-28 | Discharge: 2023-12-28 | Disposition: A | Discharge status: Home / Self Care | Source: Ambulatory Visit | Attending: Orthopedic Surgery | Admitting: Orthopedic Surgery

## 2023-12-28 ENCOUNTER — Encounter (HOSPITAL_COMMUNITY): Payer: Self-pay

## 2023-12-28 VITALS — BP 142/96 | HR 72 | Temp 98.2°F | Resp 20 | Ht 66.0 in | Wt 156.0 lb

## 2023-12-28 DIAGNOSIS — Z87891 Personal history of nicotine dependence: Secondary | ICD-10-CM | POA: Diagnosis not present

## 2023-12-28 DIAGNOSIS — I5022 Chronic systolic (congestive) heart failure: Secondary | ICD-10-CM | POA: Insufficient documentation

## 2023-12-28 DIAGNOSIS — M069 Rheumatoid arthritis, unspecified: Secondary | ICD-10-CM | POA: Diagnosis not present

## 2023-12-28 DIAGNOSIS — E785 Hyperlipidemia, unspecified: Secondary | ICD-10-CM | POA: Insufficient documentation

## 2023-12-28 DIAGNOSIS — I2510 Atherosclerotic heart disease of native coronary artery without angina pectoris: Secondary | ICD-10-CM | POA: Diagnosis not present

## 2023-12-28 DIAGNOSIS — F419 Anxiety disorder, unspecified: Secondary | ICD-10-CM | POA: Diagnosis not present

## 2023-12-28 DIAGNOSIS — Z79899 Other long term (current) drug therapy: Secondary | ICD-10-CM | POA: Diagnosis not present

## 2023-12-28 DIAGNOSIS — Z01818 Encounter for other preprocedural examination: Secondary | ICD-10-CM

## 2023-12-28 DIAGNOSIS — Z01812 Encounter for preprocedural laboratory examination: Secondary | ICD-10-CM | POA: Insufficient documentation

## 2023-12-28 DIAGNOSIS — I252 Old myocardial infarction: Secondary | ICD-10-CM | POA: Insufficient documentation

## 2023-12-28 HISTORY — DX: Depression, unspecified: F32.A

## 2023-12-28 LAB — BASIC METABOLIC PANEL WITH GFR
Anion gap: 8 (ref 5–15)
BUN: 10 mg/dL (ref 6–20)
CO2: 26 mmol/L (ref 22–32)
Calcium: 8.8 mg/dL — ABNORMAL LOW (ref 8.9–10.3)
Chloride: 106 mmol/L (ref 98–111)
Creatinine, Ser: 0.91 mg/dL (ref 0.44–1.00)
GFR, Estimated: >60 mL/min (ref >60)
Glucose, Bld: 93 mg/dL (ref 70–99)
Potassium: 4.4 mmol/L (ref 3.5–5.1)
Sodium: 140 mmol/L (ref 135–145)

## 2023-12-28 LAB — CBC
HCT: 38.6 % (ref 36.0–46.0)
Hemoglobin: 12.4 g/dL (ref 12.0–15.0)
MCH: 31 pg (ref 26.0–34.0)
MCHC: 32.1 g/dL (ref 30.0–36.0)
MCV: 96.5 fL (ref 80.0–100.0)
Platelets: 350 K/uL (ref 150–400)
RBC: 4 MIL/uL (ref 3.87–5.11)
RDW: 12.6 % (ref 11.5–15.5)
WBC: 4.6 K/uL (ref 4.0–10.5)
nRBC: 0 % (ref 0.0–0.2)

## 2023-12-28 LAB — SURGICAL PCR SCREEN
MRSA, PCR: NEGATIVE
Staphylococcus aureus: NEGATIVE

## 2023-12-28 NOTE — Progress Notes (Signed)
 PCP - Dr. Linzy Simmons-Robinson Cardiologist - Dr. Darron Rheumatologist: Dr. Elsie Defoor  Chest x-ray -  EKG - 11/26/23 Stress Test - 02/04/2022 ECHO - 05/16/2022 Cardiac Cath - 12/10/2020  Sleep Study - denies CPAP -   Fasting Blood Sugar - n/a Checks Blood Sugar _____ times a day  Last dose of GLP1 agonist-  n/a GLP1 instructions:   Blood Thinner Instructions: Aspirin  Instructions: stopped 12/20/2023  ERAS Protcol - clears until 6:30am PRE-SURGERY Ensure or G2-   COVID TEST- n/a   Anesthesia review: Yes, Cardiac history. Pt states she also completed a 2 week course of PO antibiotics (Sulfamethoxazole -DS) starting on 11/23/23 for a sinus infection. Saw ENT Dr. Herminio that did a swab and found staph. Pt completed PO ABX and continues to use ABX ointment in nose.  Patient denies shortness of breath, fever, cough and chest pain at PAT appointment

## 2023-12-29 NOTE — Anesthesia Preprocedure Evaluation (Signed)
 Anesthesia Evaluation  Patient identified by MRN, date of birth, ID band Patient awake    Reviewed: Allergy & Precautions, H&P , NPO status , Patient's Chart, lab work & pertinent test results  Airway Mallampati: II  TM Distance: >3 FB Neck ROM: Full    Dental no notable dental hx. (+) Dental Advisory Given, Missing, Poor Dentition, Chipped   Pulmonary Current Smoker and Patient abstained from smoking.   Pulmonary exam normal breath sounds clear to auscultation       Cardiovascular Exercise Tolerance: Good hypertension, Pt. on medications + CAD and + Past MI  Normal cardiovascular exam+ Valvular Problems/Murmurs  Rhythm:Regular Rate:Normal     Neuro/Psych  PSYCHIATRIC DISORDERS Anxiety Depression    negative neurological ROS  negative psych ROS   GI/Hepatic negative GI ROS, Neg liver ROS,,,  Endo/Other  negative endocrine ROS    Renal/GU negative Renal ROS  negative genitourinary   Musculoskeletal negative musculoskeletal ROS (+) Arthritis ,    Abdominal   Peds negative pediatric ROS (+)  Hematology negative hematology ROS (+)   Anesthesia Other Findings   Reproductive/Obstetrics negative OB ROS                              Anesthesia Physical Anesthesia Plan  ASA: 3  Anesthesia Plan: General   Post-op Pain Management: Ofirmev  IV (intra-op)* and Precedex   Induction: Intravenous  PONV Risk Score and Plan: 3 and Ondansetron , Dexamethasone  and Treatment may vary due to age or medical condition  Airway Management Planned: Video Laryngoscope Planned  Additional Equipment: None  Intra-op Plan:   Post-operative Plan: Extubation in OR  Informed Consent: I have reviewed the patients History and Physical, chart, labs and discussed the procedure including the risks, benefits and alternatives for the proposed anesthesia with the patient or authorized representative who has indicated  his/her understanding and acceptance.       Plan Discussed with: Anesthesiologist and CRNA  Anesthesia Plan Comments: (PAT note by [name], PA-C: 53 year old female follows cardiology for history of NSTEMI and nonobstructive CAD (August 2022), Takotsubo cardiomyopathy, heart failure with improved ejection fraction, HTN, HLD.  Seen by Lonni Freund, NP on 11/26/2023 for preop evaluation.  Per note, Preoperative cardiovascular examination: Patient with a history of Takotsubo cardiomyopathy and chronic heart failure with improved EF at 55 to 60% with grade 1 diastolic dysfunction by echo in January 2024.  Prior diagnostic catheterization showed mild, nonobstructive CAD.  She continues to do well from a cardiac standpoint without chest pain or dyspnea.  She is now pending anterior cervical discectomy and fusion with total disc replacement in the setting of prior whiplash injury after boxes fell on her head at work last year.  This has resulted in ongoing neck pain as well as numbness in her left arm and leg.  From a preoperative standpoint, she is capable of achieving 7.99 METs per DASI and her RCRI calculates to 0.9% risk of MACE related to noncardiac surgery.  In the setting, she may proceed with neck surgery as planned without any additional ischemic evaluation.  Given history of stress-induced cardiomyopathy, this would be best performed in a hospital setting.  She may hold aspirin  for 7 days preoperatively with plan to resume postoperatively, when felt to be feasible by the surgical team.  She should continue beta-blocker and statin throughout the perioperative period.  Other pertinent history includes rheumatoid arthritis (on Orencia, leflunomide , hydroxychloroquine), anxiety, former smoker (quit 2022).  Preop labs reviewed, unremarkable.  EKG 11/26/23: Normal sinus rhythm.  Rate 84. Biatrial enlargement. Left axis deviation. Possible Anteroseptal infarct , age undetermined  TTE  05/16/2022: 1. Left ventricular ejection fraction, by estimation, is 55 to 60%. The  left ventricle has normal function. The left ventricle has no regional  wall motion abnormalities. Left ventricular diastolic parameters are  consistent with Grade I diastolic  dysfunction (impaired relaxation).  2. Right ventricular systolic function is normal. The right ventricular  size is normal. There is normal pulmonary artery systolic pressure. The  estimated right ventricular systolic pressure is 18.4 mmHg.  3. The mitral valve is normal in structure. No evidence of mitral valve  regurgitation. No evidence of mitral stenosis.  4. The aortic valve is tricuspid. Aortic valve regurgitation is not  visualized. No aortic stenosis is present.  5. There is borderline dilatation of the ascending aorta, measuring 38  mm.  6. The inferior vena cava is normal in size with greater than 50%  respiratory variability, suggesting right atrial pressure of 3 mmHg.   Comparison(s): Echo 12/10/20-EF 20-25%   Nuclear stress 02/05/2022:    Normal pharmacologic myocardial perfusion stress test without evidence of significant ischemia or scar.   Left ventricular ejection fraction is normal (55-65%).   There is no coronary artery calcification.   This is a low-risk study.  )         Anesthesia Quick Evaluation

## 2023-12-29 NOTE — Progress Notes (Signed)
 Anesthesia Chart Review:  53 year old female follows cardiology for history of NSTEMI and nonobstructive CAD (August 2022), Takotsubo cardiomyopathy, heart failure with improved ejection fraction, HTN, HLD.  Seen by Lonni Freund, NP on 11/26/2023 for preop evaluation.  Per note, Preoperative cardiovascular examination: Patient with a history of Takotsubo cardiomyopathy and chronic heart failure with improved EF at 55 to 60% with grade 1 diastolic dysfunction by echo in January 2024.  Prior diagnostic catheterization showed mild, nonobstructive CAD.  She continues to do well from a cardiac standpoint without chest pain or dyspnea.  She is now pending anterior cervical discectomy and fusion with total disc replacement in the setting of prior whiplash injury after boxes fell on her head at work last year.  This has resulted in ongoing neck pain as well as numbness in her left arm and leg.  From a preoperative standpoint, she is capable of achieving 7.99 METs per DASI and her RCRI calculates to 0.9% risk of MACE related to noncardiac surgery.  In the setting, she may proceed with neck surgery as planned without any additional ischemic evaluation.  Given history of stress-induced cardiomyopathy, this would be best performed in a hospital setting.  She may hold aspirin  for 7 days preoperatively with plan to resume postoperatively, when felt to be feasible by the surgical team.  She should continue beta-blocker and statin throughout the perioperative period.  Other pertinent history includes rheumatoid arthritis (on Orencia, leflunomide , hydroxychloroquine), anxiety, former smoker (quit 2022).  Preop labs reviewed, unremarkable.  EKG 11/26/23: Normal sinus rhythm.  Rate 84. Biatrial enlargement. Left axis deviation. Possible Anteroseptal infarct , age undetermined  TTE 05/16/2022: 1. Left ventricular ejection fraction, by estimation, is 55 to 60%. The  left ventricle has normal function. The left ventricle  has no regional  wall motion abnormalities. Left ventricular diastolic parameters are  consistent with Grade I diastolic  dysfunction (impaired relaxation).   2. Right ventricular systolic function is normal. The right ventricular  size is normal. There is normal pulmonary artery systolic pressure. The  estimated right ventricular systolic pressure is 18.4 mmHg.   3. The mitral valve is normal in structure. No evidence of mitral valve  regurgitation. No evidence of mitral stenosis.   4. The aortic valve is tricuspid. Aortic valve regurgitation is not  visualized. No aortic stenosis is present.   5. There is borderline dilatation of the ascending aorta, measuring 38  mm.   6. The inferior vena cava is normal in size with greater than 50%  respiratory variability, suggesting right atrial pressure of 3 mmHg.   Comparison(s): Echo 12/10/20-EF 20-25%   Nuclear stress 02/05/2022:    Normal pharmacologic myocardial perfusion stress test without evidence of significant ischemia or scar.   Left ventricular ejection fraction is normal (55-65%).   There is no coronary artery calcification.   This is a low-risk study.     Lynwood Geofm RIGGERS [facility] Short Stay Center/Anesthesiology Phone [phone] 12/29/2023 4:12 PM

## 2023-12-31 ENCOUNTER — Ambulatory Visit (HOSPITAL_COMMUNITY)
Admission: RE | Admit: 2023-12-31 | Discharge: 2024-01-01 | Disposition: A | Discharge status: Home / Self Care | Payer: Worker's Compensation | Source: Ambulatory Visit | Attending: Orthopedic Surgery | Admitting: Orthopedic Surgery

## 2023-12-31 ENCOUNTER — Ambulatory Visit (HOSPITAL_COMMUNITY): Payer: Worker's Compensation | Admitting: Physician Assistant

## 2023-12-31 ENCOUNTER — Ambulatory Visit (HOSPITAL_BASED_OUTPATIENT_CLINIC_OR_DEPARTMENT_OTHER): Payer: Worker's Compensation | Admitting: Anesthesiology

## 2023-12-31 ENCOUNTER — Other Ambulatory Visit: Payer: Self-pay

## 2023-12-31 ENCOUNTER — Ambulatory Visit (HOSPITAL_COMMUNITY)

## 2023-12-31 ENCOUNTER — Encounter (HOSPITAL_COMMUNITY)
Admission: RE | Disposition: A | Discharge status: Home / Self Care | Payer: Self-pay | Source: Ambulatory Visit | Attending: Orthopedic Surgery

## 2023-12-31 DIAGNOSIS — M2578 Osteophyte, vertebrae: Secondary | ICD-10-CM | POA: Insufficient documentation

## 2023-12-31 DIAGNOSIS — M4722 Other spondylosis with radiculopathy, cervical region: Secondary | ICD-10-CM | POA: Diagnosis present

## 2023-12-31 DIAGNOSIS — M4802 Spinal stenosis, cervical region: Secondary | ICD-10-CM | POA: Diagnosis not present

## 2023-12-31 DIAGNOSIS — I5022 Chronic systolic (congestive) heart failure: Secondary | ICD-10-CM

## 2023-12-31 DIAGNOSIS — I110 Hypertensive heart disease with heart failure: Secondary | ICD-10-CM | POA: Diagnosis not present

## 2023-12-31 DIAGNOSIS — E785 Hyperlipidemia, unspecified: Secondary | ICD-10-CM | POA: Diagnosis not present

## 2023-12-31 DIAGNOSIS — M5011 Cervical disc disorder with radiculopathy,  high cervical region: Secondary | ICD-10-CM | POA: Insufficient documentation

## 2023-12-31 DIAGNOSIS — I252 Old myocardial infarction: Secondary | ICD-10-CM | POA: Insufficient documentation

## 2023-12-31 DIAGNOSIS — Z79899 Other long term (current) drug therapy: Secondary | ICD-10-CM | POA: Insufficient documentation

## 2023-12-31 DIAGNOSIS — I2510 Atherosclerotic heart disease of native coronary artery without angina pectoris: Secondary | ICD-10-CM | POA: Diagnosis not present

## 2023-12-31 DIAGNOSIS — M5020 Other cervical disc displacement, unspecified cervical region: Secondary | ICD-10-CM | POA: Diagnosis present

## 2023-12-31 DIAGNOSIS — G9529 Other cord compression: Secondary | ICD-10-CM | POA: Insufficient documentation

## 2023-12-31 DIAGNOSIS — I5032 Chronic diastolic (congestive) heart failure: Secondary | ICD-10-CM | POA: Diagnosis not present

## 2023-12-31 DIAGNOSIS — F32A Depression, unspecified: Secondary | ICD-10-CM | POA: Insufficient documentation

## 2023-12-31 DIAGNOSIS — F17210 Nicotine dependence, cigarettes, uncomplicated: Secondary | ICD-10-CM | POA: Diagnosis not present

## 2023-12-31 DIAGNOSIS — M069 Rheumatoid arthritis, unspecified: Secondary | ICD-10-CM | POA: Insufficient documentation

## 2023-12-31 DIAGNOSIS — M50122 Cervical disc disorder at C5-C6 level with radiculopathy: Secondary | ICD-10-CM | POA: Diagnosis not present

## 2023-12-31 DIAGNOSIS — F419 Anxiety disorder, unspecified: Secondary | ICD-10-CM | POA: Diagnosis not present

## 2023-12-31 DIAGNOSIS — F17200 Nicotine dependence, unspecified, uncomplicated: Secondary | ICD-10-CM | POA: Insufficient documentation

## 2023-12-31 HISTORY — PX: CERVICAL DISC ARTHROPLASTY: SHX587

## 2023-12-31 SURGERY — CERVICAL ANTERIOR DISC ARTHROPLASTY
Anesthesia: General

## 2023-12-31 MED ORDER — FENTANYL CITRATE (PF) 250 MCG/5ML IJ SOLN
INTRAMUSCULAR | Status: DC | PRN
  Administered 2023-12-31: 100 ug via INTRAVENOUS
  Administered 2023-12-31 (×3): 50 ug via INTRAVENOUS

## 2023-12-31 MED ORDER — MEPERIDINE HCL 25 MG/ML IJ SOLN
6.2500 mg | INTRAMUSCULAR | Status: DC | PRN
  Filled 2023-12-31: qty 1

## 2023-12-31 MED ORDER — ORAL CARE MOUTH RINSE: 15.0000 mL | Freq: Once | OROMUCOSAL | Status: AC

## 2023-12-31 MED ORDER — PHENYLEPHRINE 80 MCG/ML (10ML) SYRINGE FOR IV PUSH (FOR BLOOD PRESSURE SUPPORT)
PREFILLED_SYRINGE | INTRAVENOUS | Status: DC | PRN
  Administered 2023-12-31: 80 ug via INTRAVENOUS
  Administered 2023-12-31: 160 ug via INTRAVENOUS

## 2023-12-31 MED ORDER — SODIUM CHLORIDE 0.9 % IV SOLN
0.1500 ug/kg/min | INTRAVENOUS | Status: DC
  Filled 2023-12-31: qty 2000

## 2023-12-31 MED ORDER — LIDOCAINE 2% (20 MG/ML) 5 ML SYRINGE
INTRAMUSCULAR | Status: DC | PRN
  Administered 2023-12-31: 100 mg via INTRAVENOUS

## 2023-12-31 MED ORDER — SODIUM CHLORIDE 0.9 % IV SOLN
0.1500 ug/kg/min | INTRAVENOUS | Status: AC
  Administered 2023-12-31: .2 ug/kg/min via INTRAVENOUS
  Filled 2023-12-31: qty 2000

## 2023-12-31 MED ORDER — CHLORHEXIDINE GLUCONATE 0.12 % MT SOLN
15.0000 mL | Freq: Once | OROMUCOSAL | Status: AC
  Administered 2023-12-31: 15 mL via OROMUCOSAL
  Filled 2023-12-31: qty 15

## 2023-12-31 MED ORDER — METHOCARBAMOL 1000 MG/10ML IJ SOLN
500.0000 mg | Freq: Four times a day (QID) | INTRAMUSCULAR | Status: DC | PRN
  Administered 2023-12-31: 500 mg via INTRAVENOUS

## 2023-12-31 MED ORDER — OXYCODONE HCL 5 MG/5ML PO SOLN: 5.0000 mg | Freq: Once | ORAL | Status: DC | PRN

## 2023-12-31 MED ORDER — ACETAMINOPHEN 325 MG PO TABS: 650.0000 mg | ORAL_TABLET | ORAL | Status: DC | PRN

## 2023-12-31 MED ORDER — CEFAZOLIN SODIUM-DEXTROSE 1-4 GM/50ML-% IV SOLN
1.0000 g | Freq: Three times a day (TID) | INTRAVENOUS | Status: AC
  Administered 2023-12-31 – 2024-01-01 (×2): 1 g via INTRAVENOUS
  Filled 2023-12-31 (×2): qty 50

## 2023-12-31 MED ORDER — BUSPIRONE HCL 10 MG PO TABS
10.0000 mg | ORAL_TABLET | Freq: Two times a day (BID) | ORAL | Status: DC
  Administered 2023-12-31 – 2024-01-01 (×2): 10 mg via ORAL
  Filled 2023-12-31 (×2): qty 1

## 2023-12-31 MED ORDER — DEXAMETHASONE 4 MG PO TABS
4.0000 mg | ORAL_TABLET | Freq: Four times a day (QID) | ORAL | Status: DC
  Administered 2023-12-31 – 2024-01-01 (×4): 4 mg via ORAL
  Filled 2023-12-31 (×4): qty 1

## 2023-12-31 MED ORDER — ONDANSETRON HCL 4 MG/2ML IJ SOLN
INTRAMUSCULAR | Status: DC | PRN
  Administered 2023-12-31: 4 mg via INTRAVENOUS

## 2023-12-31 MED ORDER — DULOXETINE HCL 60 MG PO CPEP
60.0000 mg | ORAL_CAPSULE | Freq: Every day | ORAL | Status: DC
  Administered 2024-01-01: 60 mg via ORAL
  Filled 2023-12-31: qty 1

## 2023-12-31 MED ORDER — SODIUM CHLORIDE 0.9% FLUSH: 3.0000 mL | INTRAVENOUS | Status: DC | PRN

## 2023-12-31 MED ORDER — OXYCODONE HCL 5 MG PO TABS: 5.0000 mg | ORAL_TABLET | ORAL | Status: DC | PRN

## 2023-12-31 MED ORDER — ONDANSETRON HCL 4 MG PO TABS: 4.0000 mg | ORAL_TABLET | Freq: Three times a day (TID) | ORAL | 0 refills | Status: DC | PRN

## 2023-12-31 MED ORDER — LACTATED RINGERS IV SOLN: INTRAVENOUS | Status: DC

## 2023-12-31 MED ORDER — SURGIFLO WITH THROMBIN (HEMOSTATIC MATRIX KIT) OPTIME
TOPICAL | Status: DC | PRN
  Administered 2023-12-31: 1 via TOPICAL

## 2023-12-31 MED ORDER — MIDAZOLAM HCL 2 MG/2ML IJ SOLN
INTRAMUSCULAR | Status: AC
  Filled 2023-12-31: qty 2

## 2023-12-31 MED ORDER — DEXAMETHASONE SODIUM PHOSPHATE 10 MG/ML IJ SOLN
INTRAMUSCULAR | Status: DC | PRN
  Administered 2023-12-31: 10 mg via INTRAVENOUS

## 2023-12-31 MED ORDER — HYDROMORPHONE HCL 1 MG/ML IJ SOLN
INTRAMUSCULAR | Status: AC
  Filled 2023-12-31: qty 0.5

## 2023-12-31 MED ORDER — OXYCODONE HCL 5 MG PO TABS
10.0000 mg | ORAL_TABLET | ORAL | Status: DC | PRN
  Administered 2023-12-31 – 2024-01-01 (×6): 10 mg via ORAL
  Filled 2023-12-31 (×6): qty 2

## 2023-12-31 MED ORDER — HYDROMORPHONE HCL 1 MG/ML IJ SOLN: 1.0000 mg | INTRAMUSCULAR | Status: DC | PRN

## 2023-12-31 MED ORDER — THROMBIN 20000 UNITS EX SOLR
CUTANEOUS | Status: DC | PRN
  Administered 2023-12-31: 20000 [IU] via TOPICAL

## 2023-12-31 MED ORDER — PROPOFOL 10 MG/ML IV BOLUS
INTRAVENOUS | Status: AC
  Filled 2023-12-31: qty 20

## 2023-12-31 MED ORDER — HYDROMORPHONE HCL 1 MG/ML IJ SOLN
INTRAMUSCULAR | Status: DC | PRN
  Administered 2023-12-31: .5 mg via INTRAVENOUS

## 2023-12-31 MED ORDER — ONDANSETRON HCL 4 MG PO TABS: 4.0000 mg | ORAL_TABLET | Freq: Four times a day (QID) | ORAL | Status: DC | PRN

## 2023-12-31 MED ORDER — LACTATED RINGERS IV SOLN
INTRAVENOUS | Status: DC | PRN
Start: 2023-12-31 — End: 2023-12-31

## 2023-12-31 MED ORDER — OXYCODONE-ACETAMINOPHEN 10-325 MG PO TABS: 1.0000 | ORAL_TABLET | Freq: Four times a day (QID) | ORAL | 0 refills | Status: DC | PRN

## 2023-12-31 MED ORDER — MAGNESIUM CITRATE PO SOLN: 1.0000 | Freq: Once | ORAL | Status: DC | PRN

## 2023-12-31 MED ORDER — PROPOFOL 10 MG/ML IV BOLUS
INTRAVENOUS | Status: DC | PRN
  Administered 2023-12-31: 200 mg via INTRAVENOUS

## 2023-12-31 MED ORDER — FENTANYL CITRATE (PF) 100 MCG/2ML IJ SOLN
25.0000 ug | INTRAMUSCULAR | Status: DC | PRN
  Administered 2023-12-31: 25 ug via INTRAVENOUS

## 2023-12-31 MED ORDER — METHOCARBAMOL 1000 MG/10ML IJ SOLN
INTRAMUSCULAR | Status: AC
Start: 2023-12-31 — End: 2023-12-31
  Filled 2023-12-31: qty 10

## 2023-12-31 MED ORDER — OXYCODONE HCL 5 MG PO TABS: 5.0000 mg | ORAL_TABLET | Freq: Once | ORAL | Status: DC | PRN

## 2023-12-31 MED ORDER — BUPIVACAINE-EPINEPHRINE 0.25% -1:200000 IJ SOLN
INTRAMUSCULAR | Status: DC | PRN
  Administered 2023-12-31: 7 mL

## 2023-12-31 MED ORDER — SODIUM CHLORIDE 0.9% FLUSH
3.0000 mL | Freq: Two times a day (BID) | INTRAVENOUS | Status: DC
  Administered 2023-12-31: 3 mL via INTRAVENOUS

## 2023-12-31 MED ORDER — CARVEDILOL 3.125 MG PO TABS
3.1250 mg | ORAL_TABLET | Freq: Two times a day (BID) | ORAL | Status: DC
  Administered 2023-12-31 – 2024-01-01 (×2): 3.125 mg via ORAL
  Filled 2023-12-31 (×2): qty 1

## 2023-12-31 MED ORDER — ACETAMINOPHEN 650 MG RE SUPP: 650.0000 mg | RECTAL | Status: DC | PRN

## 2023-12-31 MED ORDER — FENTANYL CITRATE (PF) 100 MCG/2ML IJ SOLN
INTRAMUSCULAR | Status: AC
  Filled 2023-12-31: qty 2

## 2023-12-31 MED ORDER — BUPROPION HCL ER (XL) 150 MG PO TB24
150.0000 mg | ORAL_TABLET | Freq: Every day | ORAL | Status: DC
  Administered 2024-01-01: 150 mg via ORAL
  Filled 2023-12-31: qty 1

## 2023-12-31 MED ORDER — DEXAMETHASONE SODIUM PHOSPHATE 4 MG/ML IJ SOLN: 4.0000 mg | Freq: Four times a day (QID) | INTRAMUSCULAR | Status: DC

## 2023-12-31 MED ORDER — MIDAZOLAM HCL 2 MG/2ML IJ SOLN
INTRAMUSCULAR | Status: DC | PRN
  Administered 2023-12-31: 2 mg via INTRAVENOUS

## 2023-12-31 MED ORDER — TRANEXAMIC ACID-NACL 1000-0.7 MG/100ML-% IV SOLN
1000.0000 mg | INTRAVENOUS | Status: DC
  Filled 2023-12-31: qty 100

## 2023-12-31 MED ORDER — CEFAZOLIN SODIUM-DEXTROSE 2-4 GM/100ML-% IV SOLN
2.0000 g | INTRAVENOUS | Status: AC
Start: 2023-12-31 — End: 2023-12-31
  Administered 2023-12-31 (×2): 2 g via INTRAVENOUS
  Filled 2023-12-31: qty 100

## 2023-12-31 MED ORDER — SODIUM CHLORIDE 0.9 % IV SOLN: 250.0000 mL | INTRAVENOUS | Status: DC

## 2023-12-31 MED ORDER — THROMBIN 20000 UNITS EX KIT
PACK | CUTANEOUS | Status: AC
  Filled 2023-12-31: qty 1

## 2023-12-31 MED ORDER — ONDANSETRON HCL 4 MG/2ML IJ SOLN: 4.0000 mg | Freq: Four times a day (QID) | INTRAMUSCULAR | Status: DC | PRN

## 2023-12-31 MED ORDER — ONDANSETRON HCL 4 MG/2ML IJ SOLN: 4.0000 mg | Freq: Once | INTRAMUSCULAR | Status: DC | PRN

## 2023-12-31 MED ORDER — PHENYLEPHRINE HCL-NACL 20-0.9 MG/250ML-% IV SOLN
INTRAVENOUS | Status: DC | PRN
  Administered 2023-12-31: 30 ug/min via INTRAVENOUS

## 2023-12-31 MED ORDER — SUGAMMADEX SODIUM 200 MG/2ML IV SOLN
INTRAVENOUS | Status: DC | PRN
Start: 2023-12-31 — End: 2023-12-31
  Administered 2023-12-31: 200 mg via INTRAVENOUS

## 2023-12-31 MED ORDER — FENTANYL CITRATE (PF) 250 MCG/5ML IJ SOLN
INTRAMUSCULAR | Status: AC
  Filled 2023-12-31: qty 5

## 2023-12-31 MED ORDER — BUPIVACAINE-EPINEPHRINE (PF) 0.25% -1:200000 IJ SOLN
INTRAMUSCULAR | Status: AC
  Filled 2023-12-31: qty 30

## 2023-12-31 MED ORDER — METHOCARBAMOL 500 MG PO TABS: 500.0000 mg | ORAL_TABLET | Freq: Three times a day (TID) | ORAL | 0 refills | Status: DC | PRN

## 2023-12-31 MED ORDER — ROCURONIUM BROMIDE 10 MG/ML (PF) SYRINGE
PREFILLED_SYRINGE | INTRAVENOUS | Status: DC | PRN
  Administered 2023-12-31: 50 mg via INTRAVENOUS

## 2023-12-31 MED ORDER — LEFLUNOMIDE 10 MG PO TABS
10.0000 mg | ORAL_TABLET | Freq: Every day | ORAL | Status: DC
  Administered 2023-12-31: 10 mg via ORAL
  Filled 2023-12-31 (×2): qty 1

## 2023-12-31 MED ORDER — ATORVASTATIN CALCIUM 40 MG PO TABS
40.0000 mg | ORAL_TABLET | Freq: Every day | ORAL | Status: DC
  Administered 2024-01-01: 40 mg via ORAL
  Filled 2023-12-31: qty 1

## 2023-12-31 MED ORDER — 0.9 % SODIUM CHLORIDE (POUR BTL) OPTIME
TOPICAL | Status: DC | PRN
  Administered 2023-12-31: 1000 mL

## 2023-12-31 MED ORDER — METHOCARBAMOL 500 MG PO TABS
500.0000 mg | ORAL_TABLET | Freq: Four times a day (QID) | ORAL | Status: DC | PRN
  Administered 2023-12-31 – 2024-01-01 (×3): 500 mg via ORAL
  Filled 2023-12-31 (×3): qty 1

## 2023-12-31 MED ORDER — LISINOPRIL 20 MG PO TABS
40.0000 mg | ORAL_TABLET | Freq: Every day | ORAL | Status: DC
Start: 2024-01-01 — End: 2024-01-01
  Administered 2024-01-01: 40 mg via ORAL
  Filled 2023-12-31: qty 2

## 2023-12-31 MED ORDER — SENNOSIDES-DOCUSATE SODIUM 8.6-50 MG PO TABS
1.0000 | ORAL_TABLET | Freq: Every evening | ORAL | Status: DC | PRN
  Administered 2023-12-31: 1 via ORAL
  Filled 2023-12-31: qty 1

## 2023-12-31 SURGICAL SUPPLY — 49 items
ALLOGRFT BNE OSSIFUSE FBR 1CC (Bone Implant) IMPLANT
BAG COUNTER SPONGE SURGICOUNT (BAG) ×1 IMPLANT
CAGE SPNL 6D 12XSM 14X7X (Cage) IMPLANT
CANISTER SUCTION 3000ML PPV (SUCTIONS) ×1 IMPLANT
CLSR STERI-STRIP ANTIMIC 1/2X4 (GAUZE/BANDAGES/DRESSINGS) ×1 IMPLANT
COVER MAYO STAND STRL (DRAPES) ×2 IMPLANT
COVER SURGICAL LIGHT HANDLE (MISCELLANEOUS) ×2 IMPLANT
DISC SIMPLIFY SIZE 1 HT 4 (Miscellaneous) IMPLANT
DRAPE C-ARM 42X72 X-RAY (DRAPES) ×1 IMPLANT
DRAPE POUCH INSTRU U-SHP 10X18 (DRAPES) ×1 IMPLANT
DRAPE SURG 17X23 STRL (DRAPES) ×1 IMPLANT
DRAPE U-SHAPE 47X51 STRL (DRAPES) ×1 IMPLANT
DRSG OPSITE POSTOP 3X4 (GAUZE/BANDAGES/DRESSINGS) ×1 IMPLANT
DURAPREP 26ML APPLICATOR (WOUND CARE) ×1 IMPLANT
ELECT COATED BLADE 2.86 ST (ELECTRODE) ×1 IMPLANT
ELECT PENCIL ROCKER SW 15FT (MISCELLANEOUS) ×1 IMPLANT
ELECTRODE REM PT RTRN 9FT ADLT (ELECTROSURGICAL) ×1 IMPLANT
GLOVE BIO SURGEON STRL SZ 6.5 (GLOVE) ×1 IMPLANT
GLOVE BIOGEL PI IND STRL 6.5 (GLOVE) ×1 IMPLANT
GLOVE BIOGEL PI IND STRL 8.5 (GLOVE) ×1 IMPLANT
GLOVE SS BIOGEL STRL SZ 8.5 (GLOVE) ×1 IMPLANT
GOWN STRL REUS W/ TWL LRG LVL3 (GOWN DISPOSABLE) ×2 IMPLANT
GOWN STRL REUS W/TWL 2XL LVL3 (GOWN DISPOSABLE) ×1 IMPLANT
KIT BASIN OR (CUSTOM PROCEDURE TRAY) ×1 IMPLANT
KIT TURNOVER KIT B (KITS) ×1 IMPLANT
NDL SPNL 18GX3.5 QUINCKE PK (NEEDLE) ×1 IMPLANT
NEEDLE SPNL 18GX3.5 QUINCKE PK (NEEDLE) ×1 IMPLANT
NS IRRIG 1000ML POUR BTL (IV SOLUTION) ×1 IMPLANT
PACK ORTHO CERVICAL (CUSTOM PROCEDURE TRAY) ×1 IMPLANT
PACK UNIVERSAL I (CUSTOM PROCEDURE TRAY) ×1 IMPLANT
PAD ARMBOARD POSITIONER FOAM (MISCELLANEOUS) ×2 IMPLANT
PIN DISTRATION 14MM (PIN) IMPLANT
PLATE LOCK ENDO TCS (Plate) ×1 IMPLANT
PLATE LOCK ENDO TCS F/COVER (Plate) IMPLANT
POSITIONER HEAD DONUT 9IN (MISCELLANEOUS) ×1 IMPLANT
RESTRAINT LIMB HOLDER UNIV (RESTRAINTS) ×1 IMPLANT
SCREW ENDO BONE 3.8X14MM (Screw) IMPLANT
SPONGE SURGIFOAM ABS GEL SZ50 (HEMOSTASIS) ×1 IMPLANT
SURGIFLO W/THROMBIN 8M KIT (HEMOSTASIS) ×1 IMPLANT
SUT BONE WAX W31G (SUTURE) ×1 IMPLANT
SUT MNCRL AB 3-0 PS2 27 (SUTURE) ×1 IMPLANT
SUT VIC AB 2-0 CT1 18 (SUTURE) ×1 IMPLANT
SYR BULB IRRIG 60ML STRL (SYRINGE) ×1 IMPLANT
SYR CONTROL 10ML LL (SYRINGE) IMPLANT
TAPE CLOTH 4X10 WHT NS (GAUZE/BANDAGES/DRESSINGS) ×1 IMPLANT
TAPE UMBILICAL 1/8X30 (MISCELLANEOUS) ×1 IMPLANT
TOWEL GREEN STERILE (TOWEL DISPOSABLE) ×1 IMPLANT
TOWEL GREEN STERILE FF (TOWEL DISPOSABLE) ×1 IMPLANT
WATER STERILE IRR 1000ML POUR (IV SOLUTION) ×1 IMPLANT

## 2023-12-31 NOTE — Brief Op Note (Signed)
 12/31/2023  12:13 PM  PATIENT:  Isabella Burch  53 y.o. female  PRE-OPERATIVE DIAGNOSIS:  cervical spondylosis with radiculopathy  POST-OPERATIVE DIAGNOSIS:  cervical spondylosis with radiculopathy  PROCEDURE:  Procedure(s) with comments: CERVICAL ANTERIOR DISC ARTHROPLASTY (N/A) - Total disc replacement C 5-6, anterior cervical discectomy and fusion C3-4  SURGEON:  Surgeons and Role:    DEWAINE Burnetta Aures, MD - Primary  PHYSICIAN ASSISTANT: Amber Gal  ANESTHESIA:   general  EBL:  50 mL   BLOOD ADMINISTERED:none  DRAINS: none   LOCAL MEDICATIONS USED:  MARCAINE      SPECIMEN:  No Specimen  DISPOSITION OF SPECIMEN:  N/A  COUNTS:  YES  TOURNIQUET:  * No tourniquets in log *  DICTATION: .Dragon Dictation  PLAN OF CARE: Admit for overnight observation  PATIENT DISPOSITION:  PACU - hemodynamically stable.

## 2023-12-31 NOTE — Discharge Instructions (Signed)
 Today you will be discharged from the hospital.  The purpose of the following handout is to help guide you over the next 2 weeks.  First and foremost, be sure you have a follow up appointment with Dr. Shon Baton 2 weeks from the time of your surgery to have your sutures removed.  Please call [facility] [phone] to schedule or confirm this appointment.      Brace You do not have to wear the collar while lying in bed or sitting in a high-backed chair, eating, sleeping or showering.  Other than these instances, you must wear the brace.  You may NOT wear the collar while driving a vehicle (see driving restrictions below).  It is advisable that you wear the collar in public places or while traveling in a car as a passenger.  Dr. Shon Baton will discuss further use of the collar at your 2 week postop visit.  Wound Care You may SHOWER 5 days from the date of surgery.  Shower directly over the steri-strips.  DO NOT scrub or submerge (bath tub, swimming pool, hot tub, etc.) the area.  Pat to dry following your shower.  There is no need for additional dressings other than the steri-strips.  Allow the steri-strips to fall off on their own.  Once the strips have fallen off, you may leave the area undressed.  DO NOT apply lotion/cream/ointment to the area.  The wound must remain dry at all times other than while showering.  Dr. Shon Baton or his staff will remove your stiches at your first postop visit and give you additional instructions regarding wound care at that time.   Activity NO DRIVING FOR 2 WEEKS.  No lifting over 5 pounds (approximately a gallon of milk).  No bending, stooping, squatting or twisting.  No overhead activities.  We encourage you to walk (short distances and often throughout the day) as you can tolerate.  A good rule of thumb is to get up and move once or twice every hour.  You may go up and down stairs carefully.  As you continue to recover, Dr. Shon Baton will address and  adjust restrictions to your activities until no further restrictions are needed.  However, until your first postop visit, when Dr. Shon Baton can assess your recovery, you are to follow these instructions.  At the end of this document is a tentative outline of activities for up to 1 year.       Medication You will be discharged from the hospital with medication for pain, spasm, nausea and constipation.  You will be given enough medication to last until your first postop visit in 2 weeks.  Medications WILL NOT BE REFILLED EARLY; therefore, you are to take the medications only as directed.  If you have been given multiple prescriptions, please leave them with your pharmacy.  They can keep them on file for when you need them.  Medications that are lost or stolen WILL NOT be replaced.  We will address the need for continuing certain medications on an individual basis during your postop visit.  We ask that you avoid over the counter anti-inflammatory medications (Advil, Aleve, Motrin) for 3 months.    What you can expect following neck surgery... It is not uncommon to experience a sore throat or difficulty swallowing following neck surgery.  Cold liquids and soft foods are helpful in soothing this discomfort.  There is no specific diet that you are to follow after surgery, however, there are a  few things you should keep in mind to avoid unneeded discomfort.  Take small bites and eat slowly.  Chew your food thoroughly before swallowing.   It is not uncommon to experience incisional soreness or pain in the back of the neck, shoulders or between the shoulder blades.  These symptoms will slowly begin to resolve as you continue to recover, however, they can last for a few weeks.    It is not uncommon to experience INTERMITTENT arm pain following surgery.  This pain can mimic the arm pain you had prior to surgery.  As long as the pain resolves on its own and is not constant, there is no need to become alarmed.    When To Call If you experience fever >101F, loss of bowel or bladder control, painful swelling in the lower extremities, constant (unresolving) arm pain.  If you experience any of these symptoms, please call [facility] [phone].  What's Next As mentioned earlier, you will follow up with Dr. Shon Baton in 2 weeks.  At that time, we will likely remove your stitches and discuss additional aspects of your recovery.                   ACTIVITY GUIDELINES ANTERIOR CERVICAL DISECTOMY AND FUSION  Activity Discharge 2 weeks 6 weeks 3 months 6 months 1 year  Shower 5 days        Submerge the wound  no no yes     Walking outdoors yes       Lifting 5 lbs yes       Climbing stairs yes       Cooking yes       Car rides (less than 30 minutes) yes       Car rides (greater than 30 minutes) no varies yes     Air travel no varies yes     Short outings [employer], visits, etc...) yes       School no no yes     Driving a car no no varies yes    Light upper extremity exercises no no varies yes    Stationary bike no no yes     Swimming (no diving) no no no varies yes   Vacuuming, laundry, mopping no no no varies yes   Biking outdoors no no no no varies yes  Light jogging no no no varies yes   Low impact aerobics no no no varies yes   Non-contact sports (tennis, golf) no no no varies yes   Hunting (no tree climbing) no no no varies yes   Dancing (non-gymnastics) no no no varies yes   Down-hill skiing (experienced skier) no no no no yes   Down-hill skiing (novice) no no no no yes   Cross-country skiing no no no no yes   Horseback riding (noncompetitive)  no no no no yes   Horseback riding (competitive) no no no no varies yes  Gardening/landscaping no no no varies yes   House repairs no no no varies varies yes  Lifting up to 50 lbs no no no no varies yes

## 2023-12-31 NOTE — Anesthesia Procedure Notes (Signed)
 Procedure Name: Intubation Date/Time: 12/31/2023 8:42 AM  Performed by: Scherrie Mast, CRNAPre-anesthesia Checklist: Patient identified, Emergency Drugs available, Suction available and Patient being monitored Patient Re-evaluated:Patient Re-evaluated prior to induction Oxygen Delivery Method: Circle System Utilized Preoxygenation: Pre-oxygenation with 100% oxygen Induction Type: IV induction Ventilation: Mask ventilation without difficulty Laryngoscope Size: Glidescope and 3 Grade View: Grade I Tube type: Oral Tube size: 7.0 mm Number of attempts: 1 Airway Equipment and Method: Stylet, Oral airway and Video-laryngoscopy Placement Confirmation: ETT inserted through vocal cords under direct vision, positive ETCO2 and breath sounds checked- equal and bilateral Secured at: 21 cm Tube secured with: Tape Dental Injury: Teeth and Oropharynx as per pre-operative assessment  Comments: Patient neck stabilized maintained in neutral position, intubated via VAL with mac 3 glidescope, no e/o aspiration or regurgitation

## 2023-12-31 NOTE — Op Note (Addendum)
 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  C3-4 severe foraminal stenosis with hard disc osteophyte causing C4 nerve compression  POST-OPERATIVE DIAGNOSIS: Same  PROCEDURE:   1.  Anterior cervical discectomy and fusion C3-4 2.  Cervical disc arthroplasty C5-6  SURGEON:  Donaciano Sprang, MD  PHYSICIAN ASSISTANT: Jeoffrey Sages, PA  ANESTHESIA:   General  EBL: 50 ml   Complications: None  Implants: Titan endoskeleton 0 profile intervertebral cage with integrated screw fixation.  NuVasive Simplify cervical disc arthroplasty -4 mm small  Graft: ossifuse  BRIEF HISTORY: JANNELLY BERGREN is a 53 y.o. female who resented to my office with complaints of severe neck pain radiating into the trapezius as well as radicular right arm pain.  Imaging studies demonstrate severe foraminal stenosis at C3-4 and degenerative cervical disc disease C5-6 with posterior lateral disc herniation causing right C6 nerve compression.  The patient had a C4 selective nerve root block and had significant improvement in her pain and quality of life.  Unfortunately the radicular C6 pain has persisted and worsened.  As a result we elected to move forward with the hybrid fixation.  A ACDF was performed at C3-4 in order to maintain motion and minimize the irritation at the C4-5 level we placed a cervical disc arthroplasty at C5-6.  All appropriate risks, benefits, alternatives to surgery were discussed with the patient and consent was obtained.  PROCEDURE DETAILS: Patient was brought into the operating room and was properly positioned on the operating room table.  After induction with general anesthesia the patient was endotracheally intubated.  A timeout was taken to confirm all important data: including patient, procedure, and the level. Teds, SCD's  were applied.   The anterior cervical spine was then prepped and draped in a standard fashion.  Using fluoroscopy I marked out the C4-5 disc space.  I infiltrated the transverse incision that was centered over this disc space with quarter percent Marcaine  with epinephrine .  Transverse left-sided incision was made and I sharply dissected down to the platysma.  I then incised and the platysma and performed a standard Smith-Robinson approach to the cervical spine.  I dissected sharply along the avascular plane medial to the sternocleidomastoid.  I swept the trachea and esophagus to the right and eventually palpated and protected the carotid sheath laterally with my finger.  Once I was able to palpate the anterior longitudinal ligament I placed a hand-held retractor and used Kitner dissectors to complete my exposure at C3-4 and C5-6.  A needle was then placed into the C3-4 disc space and an x-ray was taken confirming that I was at the appropriate level.  I marked the disc space with the Bovie and then used bipolar electrocautery to mobilize the longus coli muscle from the mid body of C3 to the mid body of C4.  Caspar retractor blades were placed underneath the longus coli muscle and the endotracheal cuff was deflated and I expanded the retractor to the appropriate width and reinflated the endotracheal cuff.  An annulotomy was performed with a 15 blade scalpel then I used a combination of pituitary rongeurs curettes and Kerrison rongeurs to remove all of the disc material.  Distraction pins were placed into the body of C3 and C4 and I gently distracted the vertebral bodies and maintained with the distraction pin set.  I continued to use the  small neural curettes to dissect posteriorly until I could see the posterior annulus.  I then used a 1 mm Kerrison rongeur to trim down the posterior osteophyte from the bodies of C3 and C4.  A fine nerve hook was then used to dissect through the posterior annulus and posterior  longitudinal ligament to create a plane between the thecal sac and the posterior ligament.  I then resected the PLL with a 1 mm Kerrison rongeur which allowed me to undercut the uncovertebral joint and decompress the foramen.  At this point I was quite pleased with the overall decompression and discectomy.  I irrigated the wound copiously normal saline and then used the trial devices.  I elected to use the 7 mm lordotic 0 profile cage.  The endplates were rasped and then I obtained the implant, which was then packed with the allograft and malleted to the appropriate depth.  I removed the distraction pins and sealed the edges with bone wax.  14 mm locking screws were then advanced through the cage and into the vertebral body.  One into C3 and the other into C4.  Both screws had excellent purchase.  I then placed the locking caps to prevent the screws from backing out.  I irrigated this area copiously with normal saline and make sure hemostasis.  I repositioned my retractors to expose the C5-6 disc space.  Using the same exact technique the annulotomy and discectomy was performed.  I did find in the right posterior lateral gutter disc herniation fragments consistent with what was seen on the preoperative MRI.  These were ultimately resected.  I continued to dissect until I created a plane under the PLL and I resected the PLL with a millimeter Kerrison rongeur.  I could now freely pass my nerve hook under the vertebral body of C5 and C6 and out under the uncovertebral joints.  There were no additional fragments of disc material that I could appreciate at this time.  With the discectomy complete I irrigated the wound copiously normal saline and then elected to use the size 4 mm small implant.  Once I confirm satisfactory positioning of the trial in both the AP and lateral planes I removed and then inserted the osteotome.  Once the endplates were prepped I irrigated copiously normal saline and then obtained the  implant.  Implant was inserted to the appropriate depth.  I confirmed satisfactory final positioning with AP and lateral fluoroscopy.  I then removed the distraction pins and sealed all the bony edges with wax.  I irrigated copiously the entire wound.  I confirmed hemostasis.  Final x-rays were taken which demonstrated satisfactory positioning of both implants.  The trachea and esophagus is now returned to midline and the platysma was closed with interrupted 2-0 Vicryl suture.  The skin was then reapproximated with 3-0 Monocryl.  Steri-Strips dry dressing and a soft collar were applied and the patient was ultimately extubated transferred the PACU without incident.  At the end of the case all needle and sponge counts were correct.  There were no adverse intraoperative events.  Donaciano Sprang, MD 12/31/2023 12:01 PM

## 2023-12-31 NOTE — H&P (Signed)
 History:  Isabella Burch is a very pleasant 53 year old woman with progressive neck and radicular right arm pain. Her recent MRI clearly demonstrates severe foraminal stenosis C3-4 and C5-6. She has mild foraminal stenosis C4-5 and C6-7 without neural compression. Clinically she has C4 radicular pain that has improved with the selective nerve root block. In addition she notes numbness dysesthesias and neuropathic pain in the C6 dermatome on the right side. She has positive nerve root tension signs as well. Collectively I believe the symptomatic level is C3-4 and C5-6.  As a result of the failure to improve with conservative modalities we have elected to move forward with surgery. The plan will be to do a disc replacement at C5-6 to address the right C6 radicular pain and then an ACDF at C3-4.  Past Medical History:  Diagnosis Date   Anxiety    Ascending aorta dilatation (HCC)    a. 11/2020 CTA chest: 3.8cm asc Ao.   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 11/2020 Echo: EF 20-25%; b. 02/2021 Echo: EF 55-60%, no rwma; c. 04/2022 Echo: EF 55-60%, GrI DD, nl RV fxn, and no significant valvular dzs.   Depression    Extensor tendon rupture, non-traumatic, hand and wrist    right   Heart murmur    History of kidney stones    Hypertension    Myocardial infarction (HCC) 12/10/2020   Nonobstructive CAD (coronary artery disease)    a. 11/2020 NSTEMI/Cath: LM nl, LAD 41m, D1/2 nl, LCX min irregs, RCA nl, RPDA/RPAV nl.   Rheumatoid arthritis (HCC) dx 2006   BILATERAL HANDS AND RIGHT ANKLE   Takotsubo cardiomyopathy    a. 11/2020 Echo: EF 20-25%, mid-apical ant, inf, and apical AK. Nl RV size/fxn. Mild MR; b. 11/2020 Cath: nonobs dzs; c. 02/2021 Echo: EF 55-60%; c. 04/2022 Echo: EF 55-60%.   Tobacco abuse     Allergies  Allergen Reactions   Enbrel [Etanercept] Swelling and Other (See Comments)   Humira (1 Pen) [Adalimumab] Other (See Comments)    kidney dysfunction    No  current facility-administered medications on file prior to encounter.   Current Outpatient Medications on File Prior to Encounter  Medication Sig Dispense Refill   acetaminophen  (TYLENOL ) 500 MG tablet Take 500 mg by mouth every 6 (six) hours as needed for moderate pain, mild pain, headache or fever.     aspirin  EC 81 MG EC tablet Take 1 tablet (81 mg total) by mouth daily. Swallow whole. 30 tablet 11   atorvastatin  (LIPITOR ) 40 MG tablet TAKE 1 TABLET BY MOUTH EVERY DAY 90 tablet 3   buPROPion  (WELLBUTRIN  XL) 150 MG 24 hr tablet Take 1 tablet (150 mg total) by mouth daily. 90 tablet 2   busPIRone  (BUSPAR ) 5 MG tablet Take 2 tablets (10 mg total) by mouth 2 (two) times daily. 120 tablet 4   carvedilol  (COREG ) 3.125 MG tablet TAKE 1 TABLET BY MOUTH 2 TIMES DAILY. -- STOP METOPROLOL  180 tablet 2   DULoxetine  (CYMBALTA ) 60 MG capsule TAKE 1 CAPSULE BY MOUTH EVERY DAY 90 capsule 2   gentamicin ointment (GARAMYCIN) 0.1 % Apply 1 Application topically daily. Into nose     hydroxychloroquine (PLAQUENIL) 200 MG tablet Take 200 mg by mouth See admin instructions. Take one tablet TWICE daily weekdays. Take one tablet ONCE daily weekends. Take with food.     leflunomide  (ARAVA ) 10 MG tablet TAKE 1 TABLET BY MOUTH ONCE A DAY  lisinopril  (ZESTRIL ) 40 MG tablet TAKE 1 TABLET BY MOUTH EVERY DAY 90 tablet 3   ORENCIA CLICKJECT 125 MG/ML SOAJ Inject 125 mg into the skin every Sunday.      Physical Exam: Vitals:   12/31/23 0721  BP: (!) (P) 182/95  Pulse: (P) 63  Resp: (P) 18  Temp: (P) 98.6 F (37 C)  SpO2: (P) 96%   Body mass index is 25.18 kg/m (pended). Clinical exam: Patient is alert and oriented 3.  No shortness of breath or chest pain.  Abdomen:Soft and nontender. No loss of bowel or bladder control. No rebound tenderness  Neck: Significant neck pain with palpation and range of motion. She has tenderness along the left paraspinal region of the cervical spine radiating into the trapezius  (C4 dermatome) positive crepitus with occasional occipital headaches.  Neuro: 5/5 motor strength in both the upper and lower extremities bilaterally. Positive right Spurling sign with reproduction of C6 radicular pain. Decreased sensation to light touch and neuropathic pain in the right C6 dermatome on exam. Negative Hoffman test, symmetrical 1+ deep tendon reflexes.  Gait: Normal  Musculoskeletal: No significant pain with passive range of motion of the shoulder, elbow, wrist, hip, knee, or ankle. Negative FABER test.  Cervical MRI completed on 03/25/2023: severe canal stenosis at C5-6 and moderate to severe left foraminal narrowing. Severe right C3-4 foraminal stenosis. Moderate central stenosis at C6-7.  Cervical MRI: completed on 11/17/2023. No cord signal changes. Moderate to severe right lateral recess and foraminal stenosis due to disc herniation impinging on the right C6 nerve root. Severe right foraminal stenosis C3-4 with impingement on the right C4 nerve root. No significant change when compared to prior MRI.  Interventions: Patient has had temporary mild improvement with physiotherapy. No significant improvement with the midline epidural steroid injection on 04/30/2023. C3-4 selective nerve root block on the right side with significant improvement temporarily in her pain. Second opinion provided by Dr. Lindalee: Recommended single level ACDF C3-4. Patient has since that visit started having increasing radicular C6 right arm pain due to the severe foraminal stenosis and disc herniation at C5-6.  A/P:  Isabella Burch is a very pleasant 53 year old woman with progressive neck and radicular right arm pain. Her recent MRI clearly demonstrates severe foraminal stenosis C3-4 and C5-6. She has mild foraminal stenosis C4-5 and C6-7 without neural compression. Clinically she has C4 radicular pain that has improved with the selective nerve root block. In addition she notes numbness dysesthesias and neuropathic  pain in the C6 dermatome on the right side. She has positive nerve root tension signs as well. Collectively I believe the symptomatic level is C3-4 and C5-6.    Despite appropriate conservative management her quality of life is continued to deteriorate and she would like to move forward with surgery. Since there is only minimal disease at the C4-5 level I do not think surgery at this level is needed. I do think the ACDF at C3-4 as recommended by Dr. Lindalee is reasonable. However, I do think since she is symptomatic in the C6 dermatome that addressing the C5-6 disease is appropriate. I believe the best option is to place a disc replacement at C5-6 to maintain motion to minimize the risk of C4-5 degeneration in the future. I have gone over the surgical procedure with the patient in great detail. I also explained that if for technical reasons we are unable to place the disc replacement at C5-6 then my alternative would be to do the fusion at C5-6  and a disc replacement at C3-4. Again in an attempt to minimize adjacent segment disease at the C4-5 level.    Risks and benefits of disc arthroplasty were discussed with the patient. These include: Infection, bleeding, death, stroke, paralysis, ongoing or worse pain, need for additional surgery, nonunion, leak of spinal fluid, adjacent segment degeneration requiring additional fusion surgery. Pseudoarthrosis (nonunion)requiring supplemental posterior fixation. Throat pain, swallowing difficulties, hoarseness or change in voice. Heterotopic ossification, inability to place the disc due to technical issues requiring bailout to a fusion procedure.    Risks and benefits of ACDF were discussed with the patient. These include: Infection, bleeding, death, stroke, paralysis, ongoing or worse pain, need for additional surgery, nonunion, leak of spinal fluid, adjacent segment degeneration requiring additional fusion surgery. Pseudoarthrosis (nonunion)requiring supplemental  posterior fixation. Throat pain, swallowing difficulties, hoarseness or change in voice.     Postoperatively, the patient will require a 3 and 1 chair and potentially a cane. I expect her to remain out of work for 3 months while she recovers from the surgery. At that point if she is doing well with physical therapy we can begin to slowly increase her activities and her ability to return to work. I would expect her to be at [facility] at 9 to 12 months postop. We are awaiting cardiology clearance which she should obtain later on today.

## 2023-12-31 NOTE — Transfer of Care (Signed)
 Immediate Anesthesia Transfer of Care Note  Patient: Isabella Burch  Procedure(s) Performed: CERVICAL ANTERIOR DISC ARTHROPLASTY  Patient Location: PACU  Anesthesia Type:General  Level of Consciousness: awake, alert , and oriented  Airway & Oxygen Therapy: Patient Spontanous Breathing and Patient connected to nasal cannula oxygen  Post-op Assessment: Report given to RN and Post -op Vital signs reviewed and stable  Post vital signs: Reviewed and stable  Last Vitals:  Vitals Value Taken Time  BP 164/101 12/31/23 12:20  Temp 99 1220  Pulse 100 12/31/23 12:22  Resp 14 12/31/23 12:22  SpO2 95 % 12/31/23 12:22  Vitals shown include unfiled device data.  Last Pain:  Vitals:   12/31/23 0747  TempSrc:   PainSc: 5       Patients Stated Pain Goal: 3 (12/31/23 0747)  Complications: No notable events documented.

## 2023-12-31 NOTE — Anesthesia Postprocedure Evaluation (Signed)
 Anesthesia Post Note  Patient: Isabella Burch  Procedure(s) Performed: CERVICAL ANTERIOR DISC ARTHROPLASTY     Patient location during evaluation: PACU Anesthesia Type: General Level of consciousness: awake and alert Pain management: pain level controlled Vital Signs Assessment: post-procedure vital signs reviewed and stable Respiratory status: spontaneous breathing, nonlabored ventilation, respiratory function stable and patient connected to nasal cannula oxygen Cardiovascular status: blood pressure returned to baseline and stable Postop Assessment: no apparent nausea or vomiting Anesthetic complications: no   There were no known notable events for this encounter.  Last Vitals:  Vitals:   12/31/23 1243 12/31/23 1245  BP:  (!) 173/95  Pulse: 88 87  Resp: 15 14  Temp:    SpO2: 93% 91%    Last Pain:  Vitals:   12/31/23 1243  TempSrc:   PainSc: 9     LLE Motor Response: Purposeful movement (12/31/23 1245) LLE Sensation: Full sensation (12/31/23 1245) RLE Motor Response: Purposeful movement (12/31/23 1245) RLE Sensation: Full sensation (12/31/23 1245)      Kc Summerson

## 2024-01-01 ENCOUNTER — Other Ambulatory Visit: Payer: Self-pay | Admitting: Physician Assistant

## 2024-01-01 ENCOUNTER — Encounter (HOSPITAL_COMMUNITY): Payer: Self-pay | Admitting: Orthopedic Surgery

## 2024-01-01 DIAGNOSIS — J018 Other acute sinusitis: Secondary | ICD-10-CM

## 2024-01-01 DIAGNOSIS — M50122 Cervical disc disorder at C5-C6 level with radiculopathy: Secondary | ICD-10-CM | POA: Diagnosis not present

## 2024-01-01 MED ORDER — DIPHENHYDRAMINE HCL 25 MG PO CAPS: 25.0000 mg | ORAL_CAPSULE | Freq: Four times a day (QID) | ORAL | Status: DC | PRN

## 2024-01-01 MED ORDER — OXYCODONE-ACETAMINOPHEN 10-325 MG PO TABS: 1.0000 | ORAL_TABLET | Freq: Four times a day (QID) | ORAL | 0 refills | Status: AC | PRN

## 2024-01-01 MED ORDER — METHOCARBAMOL 500 MG PO TABS
500.0000 mg | ORAL_TABLET | Freq: Three times a day (TID) | ORAL | 0 refills | Status: AC | PRN
Start: 2024-01-01 — End: 2024-01-06

## 2024-01-01 MED ORDER — ONDANSETRON HCL 4 MG PO TABS: 4.0000 mg | ORAL_TABLET | Freq: Three times a day (TID) | ORAL | 0 refills | Status: AC | PRN

## 2024-01-01 NOTE — Progress Notes (Signed)
 PT Cancellation Note  Patient Details Name: YUNUEN MORDAN MRN: [id number] DOB: [birth year]   Cancelled Treatment:    Reason Eval/Treat Not Completed: PT screened, no needs identified, will sign off  Aleck Daring, PT, DPT Acute Rehabilitation Services Office [phone]    Aleck ONEIDA Daring 01/01/2024, 9:02 AM

## 2024-01-01 NOTE — Evaluation (Signed)
 Occupational Therapy Evaluation and Discharge Patient Details Name: Isabella Burch MRN: [id number] DOB: 10-Jul-1970 Today's Date: 01/01/2024   History of Present Illness   Pt is a 53 year old woman admitted on 12/31/23 for total disc replacement C 5-6, ACDF C 3-4. PMH: CAD, MI, HTN, HLD, anxiety, depression, RA, smoker.     Clinical Impressions Educated pt and her partner in cervical precautions, body mechanics, compensatory strategies for ADLs and IADLs to avoid. Reinforced with written hand out. Verbalized understanding. No further OT or DME needs.      If plan is discharge home, recommend the following:   Assist for transportation;Assistance with cooking/housework     Functional Status Assessment   Patient has had a recent decline in their functional status and demonstrates the ability to make significant improvements in function in a reasonable and predictable amount of time.     Equipment Recommendations   None recommended by OT     Recommendations for Other Services         Precautions/Restrictions   Precautions Precautions: Cervical Precaution Booklet Issued: Yes (comment) Recall of Precautions/Restrictions: Intact Required Braces or Orthoses: Cervical Brace Cervical Brace: Soft collar     Mobility Bed Mobility Overal bed mobility: Modified Independent             General bed mobility comments: plans to sleep in electric recliner    Transfers Overall transfer level: Modified independent Equipment used: None                      Balance                                           ADL either performed or assessed with clinical judgement   ADL Overall ADL's : Modified independent                                             Vision Baseline Vision/History: 1 Wears glasses Ability to See in Adequate Light: 0 Adequate Patient Visual Report: No change from baseline       Perception          Praxis         Pertinent Vitals/Pain Pain Assessment Pain Assessment: Faces Faces Pain Scale: Hurts little more Pain Location: throat Pain Descriptors / Indicators: Sore Pain Intervention(s): Monitored during session, Premedicated before session, Repositioned     Extremity/Trunk Assessment Upper Extremity Assessment Upper Extremity Assessment: Overall WFL for tasks assessed   Lower Extremity Assessment Lower Extremity Assessment: Overall WFL for tasks assessed   Cervical / Trunk Assessment Cervical / Trunk Assessment: Neck Surgery   Communication Communication Communication: No apparent difficulties   Cognition Arousal: Alert Behavior During Therapy: WFL for tasks assessed/performed Cognition: No apparent impairments                               Following commands: Intact       Cueing  General Comments   Cueing Techniques: Verbal cues      Exercises     Shoulder Instructions      Home Living Family/patient expects to be discharged to:: Private residence Living Arrangements: Spouse/significant other;Non-relatives/Friends Available Help at Discharge: Available PRN/intermittently;Friend(s)  Bathroom Shower/Tub: Chief Strategy Officer: Standard     Home Equipment: Tub bench;BSC/3in1;Hand held shower head;Adaptive equipment Adaptive Equipment: Reacher        Prior Functioning/Environment Prior Level of Function : Independent/Modified Independent                    OT Problem List:     OT Treatment/Interventions:        OT Goals(Current goals can be found in the care plan section)       OT Frequency:       Co-evaluation              AM-PAC OT 6 Clicks Daily Activity     Outcome Measure Help from another person eating meals?: None Help from another person taking care of personal grooming?: None Help from another person toileting, which includes using toliet, bedpan, or urinal?:  None Help from another person bathing (including washing, rinsing, drying)?: None Help from another person to put on and taking off regular upper body clothing?: None Help from another person to put on and taking off regular lower body clothing?: None 6 Click Score: 24   End of Session Equipment Utilized During Treatment: Cervical collar  Activity Tolerance: Patient tolerated treatment well Patient left: in bed;with call bell/phone within reach;with family/visitor present  OT Visit Diagnosis: Pain                Time: 9174-9150 OT Time Calculation (min): 24 min Charges:  OT General Charges $OT Visit: 1 Visit OT Evaluation $OT Eval Low Complexity: 1 Low OT Treatments $Self Care/Home Management : 8-22 mins  Isabella Burch, OTR/L Acute Rehabilitation Services Office: [phone]   Kennth Isabella Helling 01/01/2024, 8:56 AM

## 2024-01-01 NOTE — Discharge Summary (Signed)
 Patient ID: Isabella Burch MRN: [id number] DOB/AGE: 52-18-1972 52 y.o.  Admit date: 12/31/2023 Discharge date: 01/01/2024  Admission Diagnoses:  Principal Problem:   Cervical disc herniation   Discharge Diagnoses:  Principal Problem:   Cervical disc herniation  status post Procedure(s): CERVICAL ANTERIOR DISC ARTHROPLASTY  Past Medical History:  Diagnosis Date   Anxiety    Ascending aorta dilatation (HCC)    a. 11/2020 CTA chest: 3.8cm asc Ao.   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 11/2020 Echo: EF 20-25%; b. 02/2021 Echo: EF 55-60%, no rwma; c. 04/2022 Echo: EF 55-60%, GrI DD, nl RV fxn, and no significant valvular dzs.   Depression    Extensor tendon rupture, non-traumatic, hand and wrist    right   Heart murmur    History of kidney stones    Hypertension    Myocardial infarction (HCC) 12/10/2020   Nonobstructive CAD (coronary artery disease)    a. 11/2020 NSTEMI/Cath: LM nl, LAD 31m, D1/2 nl, LCX min irregs, RCA nl, RPDA/RPAV nl.   Rheumatoid arthritis (HCC) dx 2006   BILATERAL HANDS AND RIGHT ANKLE   Takotsubo cardiomyopathy    a. 11/2020 Echo: EF 20-25%, mid-apical ant, inf, and apical AK. Nl RV size/fxn. Mild MR; b. 11/2020 Cath: nonobs dzs; c. 02/2021 Echo: EF 55-60%; c. 04/2022 Echo: EF 55-60%.   Tobacco abuse     Surgeries: Procedure(s): CERVICAL ANTERIOR DISC ARTHROPLASTY on 12/31/2023   Consultants:   Discharged Condition: Improved  Hospital Course: Isabella Burch is an 53 y.o. female who was admitted 12/31/2023 for operative treatment of Cervical disc herniation. Patient failed conservative treatments (please see the history and physical for the specifics) and had severe unremitting pain that affects sleep, daily activities and work/hobbies. After pre-op clearance, the patient was taken to the operating room on 12/31/2023 and underwent  Procedure(s): CERVICAL ANTERIOR DISC ARTHROPLASTY.    Patient was given perioperative  antibiotics:  Anti-infectives (From admission, onward)    Start     Dose/Rate Route Frequency Ordered Stop   12/31/23 1800  ceFAZolin  (ANCEF ) IVPB 1 g/50 mL premix        1 g 100 mL/hr over 30 Minutes Intravenous Every 8 hours 12/31/23 1341 01/01/24 0125   12/31/23 0722  ceFAZolin  (ANCEF ) IVPB 2g/100 mL premix        2 g 200 mL/hr over 30 Minutes Intravenous 30 min pre-op 12/31/23 9277 12/31/23 1219        Patient was given sequential compression devices and early ambulation to prevent DVT.   Patient benefited maximally from hospital stay and there were no complications. At the time of discharge, the patient was urinating/moving their bowels without difficulty, tolerating a regular diet, pain is controlled with oral pain medications and they have been cleared by PT/OT.   Recent vital signs: Patient Vitals for the past 24 hrs:  BP Temp Temp src Pulse Resp SpO2  01/01/24 0544 (!) 146/82 98 F (36.7 C) Oral 85 18 96 %  12/31/23 2339 135/83 98.2 F (36.8 C) Oral 81 18 93 %  12/31/23 2008 (!) 160/78 98 F (36.7 C) Oral 75 18 94 %  12/31/23 1634 (!) 163/86 -- -- 84 16 95 %  12/31/23 1348 (!) 159/67 98 F (36.7 C) -- 85 20 93 %  12/31/23 1315 (!) 158/93 98.8 F (37.1 C) -- 87 20 98 %  12/31/23 1300 (!) 157/94 -- -- 84 13 97 %  12/31/23 1245 (!) 173/95 -- --  87 14 91 %  12/31/23 1243 -- -- -- 88 15 93 %  12/31/23 1230 (!) 182/91 -- -- 92 18 93 %  12/31/23 1220 (!) 164/101 99 F (37.2 C) -- 99 16 93 %     Recent laboratory studies: No results for input(s): WBC, HGB, HCT, PLT, NA, K, CL, CO2, BUN, CREATININE, GLUCOSE, INR, CALCIUM  in the last 72 hours.  Invalid input(s): PT, 2   Discharge Medications:   Allergies as of 01/01/2024       Reactions   Enbrel [etanercept] Swelling, Other (See Comments)   Humira (1 Pen) [adalimumab] Other (See Comments)   kidney dysfunction        Medication List     STOP taking these medications     acetaminophen  500 MG tablet Commonly known as: TYLENOL    aspirin  EC 81 MG tablet   Orencia ClickJect 125 MG/ML Soaj Generic drug: Abatacept       TAKE these medications    atorvastatin  40 MG tablet Commonly known as: LIPITOR  TAKE 1 TABLET BY MOUTH EVERY DAY   buPROPion  150 MG 24 hr tablet Commonly known as: Wellbutrin  XL Take 1 tablet (150 mg total) by mouth daily.   busPIRone  5 MG tablet Commonly known as: BUSPAR  Take 2 tablets (10 mg total) by mouth 2 (two) times daily.   carvedilol  3.125 MG tablet Commonly known as: COREG  TAKE 1 TABLET BY MOUTH 2 TIMES DAILY. -- STOP METOPROLOL    DULoxetine  60 MG capsule Commonly known as: CYMBALTA  TAKE 1 CAPSULE BY MOUTH EVERY DAY   gentamicin ointment 0.1 % Commonly known as: GARAMYCIN Apply 1 Application topically daily. Into nose   hydroxychloroquine 200 MG tablet Commonly known as: PLAQUENIL Take 200 mg by mouth See admin instructions. Take one tablet TWICE daily weekdays. Take one tablet ONCE daily weekends. Take with food.   leflunomide  10 MG tablet Commonly known as: ARAVA  TAKE 1 TABLET BY MOUTH ONCE A DAY   lisinopril  40 MG tablet Commonly known as: ZESTRIL  TAKE 1 TABLET BY MOUTH EVERY DAY   methocarbamol  500 MG tablet Commonly known as: ROBAXIN  Take 1 tablet (500 mg total) by mouth every 8 (eight) hours as needed for up to 5 days for muscle spasms.   ondansetron  4 MG tablet Commonly known as: Zofran  Take 1 tablet (4 mg total) by mouth every 8 (eight) hours as needed for nausea or vomiting.   oxyCODONE -acetaminophen  10-325 MG tablet Commonly known as: Percocet Take 1 tablet by mouth every 6 (six) hours as needed for up to 5 days for pain.        Diagnostic Studies: DG Cervical Spine 2 or 3 views Result Date: 12/31/2023 CLINICAL DATA:  Cervical spine MRI dated 03/25/2023 EXAM: CERVICAL SPINE - 2-3 VIEW COMPARISON:  None Available. FINDINGS: Three C-arm images of the cervical spine demonstrate interbody  and screw fixation at the C3-4 level and an interbody spacer at the C5-6 level with normal alignment. Mild anterior and posterior spur formation at the C5-6 level. An endotracheal tube is in place. IMPRESSION: C3-4 and C5-6 fusion with normal alignment. Electronically Signed   By: Elspeth Bathe M.D.   On: 12/31/2023 13:06   DG C-Arm 1-60 Min-No Report Result Date: 12/31/2023 Fluoroscopy was utilized by the requesting physician.  No radiographic interpretation.   DG C-Arm 1-60 Min-No Report Result Date: 12/31/2023 Fluoroscopy was utilized by the requesting physician.  No radiographic interpretation.   DG C-Arm 1-60 Min-No Report Result Date: 12/31/2023 Fluoroscopy was utilized by the  requesting physician.  No radiographic interpretation.   DG C-Arm 1-60 Min-No Report Result Date: 12/31/2023 Fluoroscopy was utilized by the requesting physician.  No radiographic interpretation.       Follow-up Information     Burnetta Aures, MD. Schedule an appointment as soon as possible for a visit in 2 week(s).   Specialty: Orthopedic Surgery Why: If symptoms worsen, For suture removal, For wound re-check Contact information: [address] [place] [address] [phone]                 Discharge Plan:  discharge to home today   Disposition:  Darthula is a very pleasant 53 year old female  who is POD1 from ACDF C3-C4 and TDR C5-C6. Surgical intervention was successful and without complications. Her hospital course has been uncomplicated. She states that her pre-operative arm pain is much improved since surgery. She is ambulating on her own. She is tolerating oral intake well. She is compliant with the Cervical Soft Collar. She is complaint with the incentive spirometer. She reports that his pain is well controled with oral pain medications. Dressing is c/d/I.  Positive void, positive flatus, negative BM. Patient will follow up with us  in clinic in 2 weeks. Patient understands  that she cannot shower for the first 5 days post-op. All questions were welcomed and addressed.    Signed: Jeoffrey LOISE Sages for Dr. Aures [facility] [phone] 01/01/2024, 7:35 AM

## 2024-01-01 NOTE — Progress Notes (Signed)
 Patient alert and oriented, voided, ambulate. Surgical site clean and dry no sign of infection. D/c instructions explain and given to the patient and husband all questions answered to the patient's satisfaction.

## 2024-01-21 ENCOUNTER — Other Ambulatory Visit: Payer: Self-pay | Admitting: Family Medicine

## 2024-01-21 DIAGNOSIS — F32A Depression, unspecified: Secondary | ICD-10-CM

## 2024-02-25 ENCOUNTER — Encounter: Payer: Self-pay | Admitting: Family Medicine

## 2024-02-25 ENCOUNTER — Ambulatory Visit: Admitting: Family Medicine

## 2024-02-25 VITALS — BP 124/82 | HR 78 | Ht 66.0 in | Wt 155.5 lb

## 2024-02-25 DIAGNOSIS — E785 Hyperlipidemia, unspecified: Secondary | ICD-10-CM

## 2024-02-25 DIAGNOSIS — N951 Menopausal and female climacteric states: Secondary | ICD-10-CM | POA: Diagnosis not present

## 2024-02-25 DIAGNOSIS — R29898 Other symptoms and signs involving the musculoskeletal system: Secondary | ICD-10-CM

## 2024-02-25 DIAGNOSIS — I5022 Chronic systolic (congestive) heart failure: Secondary | ICD-10-CM

## 2024-02-25 DIAGNOSIS — I10 Essential (primary) hypertension: Secondary | ICD-10-CM | POA: Insufficient documentation

## 2024-02-25 DIAGNOSIS — F339 Major depressive disorder, recurrent, unspecified: Secondary | ICD-10-CM

## 2024-02-25 DIAGNOSIS — M5416 Radiculopathy, lumbar region: Secondary | ICD-10-CM | POA: Diagnosis not present

## 2024-02-25 DIAGNOSIS — Z0000 Encounter for general adult medical examination without abnormal findings: Secondary | ICD-10-CM

## 2024-02-25 MED ORDER — GABAPENTIN 300 MG PO CAPS: 300.0000 mg | ORAL_CAPSULE | Freq: Every day | ORAL | 3 refills | Status: AC

## 2024-02-25 MED ORDER — PROGESTERONE MICRONIZED 100 MG PO CAPS: 100.0000 mg | ORAL_CAPSULE | Freq: Every day | ORAL | 0 refills | Status: AC

## 2024-02-25 MED ORDER — ESTRADIOL 1 MG PO TABS: 1.0000 mg | ORAL_TABLET | Freq: Every day | ORAL | 0 refills | Status: AC

## 2024-02-25 NOTE — Progress Notes (Unsigned)
 Established patient visit   Patient: Isabella Burch   DOB: 07/22/1970   53 y.o. Female  MRN: [id number] Visit Date: 02/25/2024  Today's healthcare provider: Rockie Agent, MD   Chief Complaint  Patient presents with  . Menopause    Patient presents to discuss menopause symptoms.  Hot flashes, mood changes, sleep disturbances (concerned this could be due to med changes).   . Numbness    Patient reports tingling/ pins and needles feeling on left leg x one year. Requests referral to neurology for evaluation. Happened after accident at work and surgery on cervical spine    Subjective     HPI     Menopause    Additional comments: Patient presents to discuss menopause symptoms.  Hot flashes, mood changes, sleep disturbances (concerned this could be due to med changes).         Numbness    Additional comments: Patient reports tingling/ pins and needles feeling on left leg x one year. Requests referral to neurology for evaluation. Happened after accident at work and surgery on cervical spine       Last edited by Cherry Chiquita HERO, CMA on 02/25/2024  2:28 PM.       Discussed the use of AI scribe software for clinical note transcription with the patient, who gave verbal consent to proceed.  History of Present Illness      Past Medical History:  Diagnosis Date  . Anxiety   . Ascending aorta dilatation    a. 11/2020 CTA chest: 3.8cm asc Ao.  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 11/2020 Echo: EF 20-25%; b. 02/2021 Echo: EF 55-60%, no rwma; c. 04/2022 Echo: EF 55-60%, GrI DD, nl RV fxn, and no significant valvular dzs.  . Depression   . Extensor tendon rupture, non-traumatic, hand and wrist    right  . Heart murmur   . History of kidney stones   . Hypertension   . Myocardial infarction (HCC) 12/10/2020  . Nonobstructive CAD (coronary artery disease)    a. 11/2020 NSTEMI/Cath: LM nl, LAD 65m, D1/2 nl, LCX min irregs, RCA  nl, RPDA/RPAV nl.  . Rheumatoid arthritis (HCC) dx 2006   BILATERAL HANDS AND RIGHT ANKLE  . Takotsubo cardiomyopathy    a. 11/2020 Echo: EF 20-25%, mid-apical ant, inf, and apical AK. Nl RV size/fxn. Mild MR; b. 11/2020 Cath: nonobs dzs; c. 02/2021 Echo: EF 55-60%; c. 04/2022 Echo: EF 55-60%.  . Tobacco abuse     Medications: Outpatient Medications Prior to Visit  Medication Sig  . atorvastatin  (LIPITOR ) 40 MG tablet TAKE 1 TABLET BY MOUTH EVERY DAY  . buPROPion  (WELLBUTRIN  XL) 150 MG 24 hr tablet Take 1 tablet (150 mg total) by mouth daily.  . busPIRone  (BUSPAR ) 5 MG tablet TAKE 2 TABLETS BY MOUTH 2 TIMES DAILY.  . carvedilol  (COREG ) 3.125 MG tablet TAKE 1 TABLET BY MOUTH 2 TIMES DAILY. -- STOP METOPROLOL   . DULoxetine  (CYMBALTA ) 60 MG capsule TAKE 1 CAPSULE BY MOUTH EVERY DAY  . gentamicin ointment (GARAMYCIN) 0.1 % Apply 1 Application topically daily. Into nose  . hydroxychloroquine (PLAQUENIL) 200 MG tablet Take 200 mg by mouth See admin instructions. Take one tablet TWICE daily weekdays. Take one tablet ONCE daily weekends. Take with food.  . leflunomide  (ARAVA ) 10 MG tablet TAKE 1 TABLET BY MOUTH ONCE A DAY  . lisinopril  (ZESTRIL ) 40 MG tablet TAKE 1 TABLET BY MOUTH EVERY DAY  . ondansetron  (  ZOFRAN ) 4 MG tablet Take 1 tablet (4 mg total) by mouth every 8 (eight) hours as needed for nausea or vomiting.  . [DISCONTINUED] levocetirizine (XYZAL ) 5 MG tablet TAKE 1 TABLET BY MOUTH EVERY DAY IN THE EVENING   No facility-administered medications prior to visit.    Review of Systems  Last metabolic panel Lab Results  Component Value Date   GLUCOSE 93 12/28/2023   NA 140 12/28/2023   K 4.4 12/28/2023   CL 106 12/28/2023   CO2 26 12/28/2023   BUN 10 12/28/2023   CREATININE 0.91 12/28/2023   GFRNONAA >60 12/28/2023   CALCIUM  8.8 (L) 12/28/2023   PROT 6.3 07/29/2022   ALBUMIN 4.2 07/29/2022   LABGLOB 2.1 07/29/2022   AGRATIO 2.0 07/29/2022   BILITOT 0.3 07/29/2022   ALKPHOS 118  07/29/2022   AST 35 07/29/2022   ALT 46 (H) 07/29/2022   ANIONGAP 8 12/28/2023   Last lipids Lab Results  Component Value Date   CHOL 155 07/29/2022   HDL 56 07/29/2022   LDLCALC 80 07/29/2022   TRIG 108 07/29/2022   CHOLHDL 2.8 07/29/2022   Last hemoglobin A1c Lab Results  Component Value Date   HGBA1C 5.8 (H) 07/29/2022   Last thyroid  functions Lab Results  Component Value Date   TSH 1.240 07/29/2022   FREET4 0.89 07/29/2022     {See past labs  Heme  Chem  Endocrine  Serology  Results Review (optional):1}   Objective    BP (!) 124/96 (BP Location: Left Arm, Patient Position: Sitting, Cuff Size: Normal)   Pulse 78   Ht 5' 6 (1.676 m)   Wt 155 lb 8 oz (70.5 kg)   LMP 10/19/2020   SpO2 98%   BMI 25.10 kg/m  {Insert last BP/Wt (optional):23777}{See vitals history (optional):1}    Physical Exam  ***  No results found for any visits on 02/25/24.  Assessment & Plan     Problem List Items Addressed This Visit   None   Assessment and Plan Assessment & Plan      No follow-ups on file.         Rockie Agent, MD  [facility] [phone] (phone) [phone] (fax)  [facility]

## 2024-02-25 NOTE — Patient Instructions (Addendum)
 [facility] at [facility] [address] [place],  [place]  [address] Main: [phone]     To keep you healthy, please keep in mind the following health maintenance items that you are due for:   Health Maintenance Due  Topic Date Due   Mammogram  Never done   Colonoscopy  Never done     Best Wishes,   Dr. Lang

## 2024-02-26 ENCOUNTER — Ambulatory Visit: Payer: Self-pay | Admitting: Family Medicine

## 2024-02-26 LAB — CMP14+EGFR
ALT: 19 IU/L (ref 0–32)
AST: 27 IU/L (ref 0–40)
Albumin: 4 g/dL (ref 3.8–4.9)
Alkaline Phosphatase: 100 IU/L (ref 49–135)
BUN/Creatinine Ratio: 17 (ref 9–23)
BUN: 14 mg/dL (ref 6–24)
Bilirubin Total: 0.2 mg/dL (ref 0.0–1.2)
CO2: 25 mmol/L (ref 20–29)
Calcium: 9.4 mg/dL (ref 8.7–10.2)
Chloride: 104 mmol/L (ref 96–106)
Creatinine, Ser: 0.81 mg/dL (ref 0.57–1.00)
Globulin, Total: 2.5 g/dL (ref 1.5–4.5)
Glucose: 103 mg/dL — ABNORMAL HIGH (ref 70–99)
Potassium: 5 mmol/L (ref 3.5–5.2)
Sodium: 142 mmol/L (ref 134–144)
Total Protein: 6.5 g/dL (ref 6.0–8.5)
eGFR: 87 mL/min/1.73 (ref >59)

## 2024-02-26 LAB — TSH+FREE T4
Free T4: 0.86 ng/dL (ref 0.82–1.77)
TSH: 0.87 u[IU]/mL (ref 0.450–4.500)

## 2024-03-02 ENCOUNTER — Ambulatory Visit
Admission: RE | Admit: 2024-03-02 | Discharge: 2024-03-02 | Disposition: A | Discharge status: Home / Self Care | Payer: Self-pay | Source: Ambulatory Visit | Attending: Physician Assistant | Admitting: Physician Assistant

## 2024-03-02 ENCOUNTER — Ambulatory Visit: Admitting: Physician Assistant

## 2024-03-02 ENCOUNTER — Encounter: Payer: Self-pay | Admitting: Physician Assistant

## 2024-03-02 ENCOUNTER — Ambulatory Visit (INDEPENDENT_AMBULATORY_CARE_PROVIDER_SITE_OTHER)

## 2024-03-02 VITALS — BP 120/82 | Wt 156.2 lb

## 2024-03-02 DIAGNOSIS — Z049 Encounter for examination and observation for unspecified reason: Secondary | ICD-10-CM

## 2024-03-02 DIAGNOSIS — R29898 Other symptoms and signs involving the musculoskeletal system: Secondary | ICD-10-CM | POA: Diagnosis not present

## 2024-03-02 DIAGNOSIS — R202 Paresthesia of skin: Secondary | ICD-10-CM | POA: Diagnosis not present

## 2024-03-02 DIAGNOSIS — R200 Anesthesia of skin: Secondary | ICD-10-CM

## 2024-03-02 DIAGNOSIS — M5416 Radiculopathy, lumbar region: Secondary | ICD-10-CM

## 2024-03-02 DIAGNOSIS — M545 Low back pain, unspecified: Secondary | ICD-10-CM

## 2024-03-02 NOTE — Progress Notes (Signed)
 Referring Physician:  Sharma Coyer, MD [address] [place],  [place] [address]  Primary Physician:  Sharma Coyer, MD  History of Present Illness: 03/02/2024 Ms. Bascom Satterfield recently underwent cervical disc arthroplasty 2 months ago.  However comes today for back pain that she believes is suffered at the time of the same incident of her neck.  Patient states around a year ago she got hit in the head by a large amount of weight while at work.  She feels waves of numbness, weakness, tingling that goes down her left leg.  She experiences pain in her lower back while walking, standing or sitting.  She states that it encompasses her entire leg when it occurs.  It is worse when changing positions.  The waves of pain are intermittent, but she has a constant pain in her buttock and left low back.  She feels weakness between her hip and knee and at times feels as though it gives out on her.  She uses Tylenol , ibuprofen, gabapentin for pain.  No saddle esthesia or incontinence.     Weakness: none Bowel/Bladder Dysfunction: none  Conservative measures:  Physical therapy: Has not participated in. Multimodal medical therapy including regular antiinflammatories: none Injections: No epidural steroid injections? The symptoms are causing a significant impact on the patient's life.   Review of Systems:  A 10 point review of systems is negative, except for the pertinent positives and negatives detailed in the HPI.  Past Medical History: Past Medical History:  Diagnosis Date   Anxiety    Ascending aorta dilatation    a. 11/2020 CTA chest: 3.8cm asc Ao.   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 11/2020 Echo: EF 20-25%; b. 02/2021 Echo: EF 55-60%, no rwma; c. 04/2022 Echo: EF 55-60%, GrI DD, nl RV fxn, and no significant valvular dzs.   Depression    Extensor tendon rupture, non-traumatic, hand and wrist    right   Heart  murmur    History of kidney stones    Hypertension    Myocardial infarction (HCC) 12/10/2020   Nonobstructive CAD (coronary artery disease)    a. 11/2020 NSTEMI/Cath: LM nl, LAD 42m, D1/2 nl, LCX min irregs, RCA nl, RPDA/RPAV nl.   Rheumatoid arthritis (HCC) dx 2006   BILATERAL HANDS AND RIGHT ANKLE   Takotsubo cardiomyopathy    a. 11/2020 Echo: EF 20-25%, mid-apical ant, inf, and apical AK. Nl RV size/fxn. Mild MR; b. 11/2020 Cath: nonobs dzs; c. 02/2021 Echo: EF 55-60%; c. 04/2022 Echo: EF 55-60%.   Tobacco abuse     Past Surgical History: Past Surgical History:  Procedure Laterality Date   CARDIAC CATHETERIZATION     CERVICAL DISC ARTHROPLASTY N/A 12/31/2023   Procedure: CERVICAL ANTERIOR DISC ARTHROPLASTY;  Surgeon: Burnetta Aures, MD;  Location: [facility];  Service: Orthopedics;  Laterality: N/A;  Total disc replacement C 5-6, anterior cervical discectomy and fusion C3-4   CYSTOSCOPY WITH URETEROSCOPY, STONE BASKETRY AND STENT PLACEMENT  2011   LEFT HEART CATH AND CORONARY ANGIOGRAPHY N/A 12/10/2020   Procedure: LEFT HEART CATH AND CORONARY ANGIOGRAPHY;  Surgeon: Darron Deatrice LABOR, MD;  Location: [facility] INVASIVE CV [facility];  Service: Cardiovascular;  Laterality: N/A;   LEFT WRIST EXTENSOR TENDON REPAIR     TENDON TRANSFER Right 11/09/2014   Procedure: RIGHT THUMB EIP TO EPL TENDON TRANSFER;  Surgeon: Prentice Pagan, MD;  Location: [facility];  Service: Orthopedics;  Laterality: Right;   TUBAL LIGATION  Allergies: Allergies as of 03/02/2024 - Review Complete 03/02/2024  Allergen Reaction Noted   Enbrel [etanercept] Swelling and Other (See Comments) 02/25/2013   Humira (1 pen) [adalimumab] Other (See Comments) 02/25/2013    Medications: Outpatient Encounter Medications as of 03/02/2024  Medication Sig   atorvastatin  (LIPITOR ) 40 MG tablet TAKE 1 TABLET BY MOUTH EVERY DAY   buPROPion  (WELLBUTRIN  XL) 150 MG 24 hr tablet Take 1 tablet (150 mg total) by mouth daily.    busPIRone  (BUSPAR ) 5 MG tablet TAKE 2 TABLETS BY MOUTH 2 TIMES DAILY.   carvedilol  (COREG ) 3.125 MG tablet TAKE 1 TABLET BY MOUTH 2 TIMES DAILY. -- STOP METOPROLOL    DULoxetine  (CYMBALTA ) 60 MG capsule TAKE 1 CAPSULE BY MOUTH EVERY DAY   estradiol  (ESTRACE ) 1 MG tablet Take 1 tablet (1 mg total) by mouth daily.   gabapentin (NEURONTIN) 300 MG capsule Take 1 capsule (300 mg total) by mouth at bedtime.   gentamicin ointment (GARAMYCIN) 0.1 % Apply 1 Application topically daily. Into nose   hydroxychloroquine (PLAQUENIL) 200 MG tablet Take 200 mg by mouth See admin instructions. Take one tablet TWICE daily weekdays. Take one tablet ONCE daily weekends. Take with food.   leflunomide  (ARAVA ) 10 MG tablet TAKE 1 TABLET BY MOUTH ONCE A DAY   lisinopril  (ZESTRIL ) 40 MG tablet TAKE 1 TABLET BY MOUTH EVERY DAY   ondansetron  (ZOFRAN ) 4 MG tablet Take 1 tablet (4 mg total) by mouth every 8 (eight) hours as needed for nausea or vomiting.   progesterone (PROMETRIUM) 100 MG capsule Take 1 capsule (100 mg total) by mouth daily.   No facility-administered encounter medications on file as of 03/02/2024.    Social History: Social History   Tobacco Use   Smoking status: Every Day    Types: E-cigarettes   Smokeless tobacco: Never   Tobacco comments:    0.5 ppd for much of her life but currently smoking 2-4 cigarettes/day (11/2020)  Vaping Use   Vaping status: Every Day   Substances: Nicotine  Substance Use Topics   Alcohol use: No    Alcohol/week: 0.0 standard drinks of alcohol   Drug use: No    Family Medical History: Family History  Problem Relation Age of Onset   Hypothyroidism Mother    Coronary artery disease Mother    Hyperlipidemia Father    Hypertension Father    Diabetes Father     Physical Examination: @VITALWITHPAIN @  General: Patient is well developed, well nourished, calm, collected, and in no apparent distress. Attention to examination is appropriate.  Psychiatric: Patient is  non-anxious.  Head:  Pupils equal, round, and reactive to light.  ENT:  Oral mucosa appears well hydrated.  Neck:   Supple.    Respiratory: Patient is breathing without any difficulty.  Extremities: No edema.  Vascular: Palpable dorsal pedal pulses.  Skin:   On exposed skin, there are no abnormal skin lesions.  NEUROLOGICAL:     Awake, alert, oriented to person, place, and time.  Speech is clear and fluent. Fund of knowledge is appropriate.   Cranial Nerves: Pupils equal round and reactive to light.  Facial tone is symmetric.  ROM of spine: Patient has tenderness palpation of left lumbar paraspinals. Positive straight leg raise.  Strength:  Strength exam was difficult.  Patient has giveaway weakness throughout.  At least 4/5 strength in bilateral lower extremities.  No ankle clonus.  2+ in bilateral patella. Medical Decision Making  Imaging: Lumbar x-rays to be completed in clinic today.  Assessment  and Plan: Ms. Lennox is a pleasant 53 y.o. female with low back pain over the past year after an incident at work.  She has constant pain in her left low back and left buttock.  With flares that occur the result in numbness, tingling, weakness the entirety of her left lower extremity.  Difficult to determine if this is in a dermatomal distribution given distribution of pain.  Working to obtain outside imaging and report.  Of note, patient did just have cervical arthroplasty completed at another neurosurgeons office.  Plan includes the following moving forward:  -X-ray of lumbar spine - MRI of lumbar spine to evaluate for stenosis given long standing pain - Referral made for physical therapy - Referral made to neurology for EMG of left lower extremity. - See back in 8 weeks.   Thank you for involving me in the care of this patient.    Lyle Decamp, PA-C Dept. of Neurosurgery

## 2024-03-05 ENCOUNTER — Ambulatory Visit
Admission: RE | Admit: 2024-03-05 | Discharge: 2024-03-05 | Disposition: A | Source: Ambulatory Visit | Attending: Physician Assistant | Admitting: Physician Assistant

## 2024-03-05 DIAGNOSIS — M5416 Radiculopathy, lumbar region: Secondary | ICD-10-CM

## 2024-03-31 ENCOUNTER — Other Ambulatory Visit: Payer: Self-pay | Admitting: Family Medicine

## 2024-03-31 DIAGNOSIS — J018 Other acute sinusitis: Secondary | ICD-10-CM

## 2024-04-28 ENCOUNTER — Ambulatory Visit: Attending: Nurse Practitioner | Admitting: Nurse Practitioner

## 2024-04-28 ENCOUNTER — Encounter: Payer: Self-pay | Admitting: Nurse Practitioner

## 2024-04-28 VITALS — BP 108/60 | HR 70 | Ht 66.0 in | Wt 157.1 lb

## 2024-04-28 DIAGNOSIS — I5181 Takotsubo syndrome: Secondary | ICD-10-CM | POA: Diagnosis not present

## 2024-04-28 DIAGNOSIS — I2510 Atherosclerotic heart disease of native coronary artery without angina pectoris: Secondary | ICD-10-CM | POA: Diagnosis not present

## 2024-04-28 DIAGNOSIS — E785 Hyperlipidemia, unspecified: Secondary | ICD-10-CM | POA: Diagnosis not present

## 2024-04-28 DIAGNOSIS — I10 Essential (primary) hypertension: Secondary | ICD-10-CM

## 2024-04-28 DIAGNOSIS — I5020 Unspecified systolic (congestive) heart failure: Secondary | ICD-10-CM | POA: Diagnosis not present

## 2024-04-28 MED ORDER — ASPIRIN 81 MG PO TBEC: 81.0000 mg | DELAYED_RELEASE_TABLET | Freq: Every day | ORAL | Status: AC

## 2024-04-28 NOTE — Patient Instructions (Signed)
 Medication Instructions:  Your physician recommends that you continue on your current medications as directed. Please refer to the Current Medication list given to you today.   *If you need a refill on your cardiac medications before your next appointment, please call your pharmacy*  Lab Work: Your provider would like for you to return in 1 day to have the following labs drawn: FASTING Lipid panel.   Please go to [facility] [address] ([facility] [address], [place] [address] You do not need an appointment.  They are open from 8 am- 4:30 pm.  Lunch from 1:00 pm- 2:00 pm You DO  need to be fasting.   You may also go to one of the following [facility]:  [address] [place], [place] [address] Phone: [phone] Lab hours: Mon-Fri 8 am- 5 pm    Lunch 12 pm- 1 pm  [address] [place],  [place]  [address]  [place]  Phone: [phone] Lab hours: 7 am- 4 pm Lunch 12 pm-1 pm   [address] [place],  [place]  [address]  [place]  Phone: [phone] Lab hours: Mon-Fri 8 am- 5 pm    Lunch 12 pm- 1 pm  If you have labs (blood work) drawn today and your tests are completely normal, you will receive your results only by: MyChart Message (if you have MyChart) OR A paper copy in the mail If you have any lab test that is abnormal or we need to change your treatment, we will call you to review the results.  Follow-Up: At [facility], you and your health needs are our priority.  As part of our continuing mission to provide you with exceptional heart care, our providers are all part of one team.  This team includes your primary Cardiologist (physician) and Advanced Practice Providers or APPs (Physician Assistants and Nurse Practitioners) who all work together to provide you with the care you need, when you need it.  Your next appointment:   1 year(s)  Provider:   You may see Deatrice Cage, MD

## 2024-04-28 NOTE — Progress Notes (Signed)
 "    Office Visit    Patient Name: Isabella Burch Date of Encounter: 04/28/2024  Primary Care Provider:  Sharma Coyer, MD Primary Cardiologist:  Isabella Cage, MD  Cardiology APP:  Isabella Lonni Ingle, NP   Chief Complaint    53 y.o. female with a history of non-STEMI and nonobstructive CAD (August 2022), Takotsubo cardiomyopathy, heart failure with improved ejection fraction, hypertension, hyperlipidemia, remote tobacco abuse, rheumatoid arthritis, and anxiety, who presents for follow-up related to HFimpEF.  Past Medical History   Subjective   Past Medical History:  Diagnosis Date   Anxiety    Ascending aorta dilatation    a. 11/2020 CTA chest: 3.8cm asc Ao.   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 11/2020 Echo: EF 20-25%; b. 02/2021 Echo: EF 55-60%, no rwma; c. 04/2022 Echo: EF 55-60%, GrI DD, nl RV fxn, and no significant valvular dzs.   Depression    Extensor tendon rupture, non-traumatic, hand and wrist    right   Heart murmur    History of kidney stones    Hypertension    Myocardial infarction (HCC) 12/10/2020   Nonobstructive CAD (coronary artery disease)    a. 11/2020 NSTEMI/Cath: LM nl, LAD 62m, D1/2 nl, LCX min irregs, RCA nl, RPDA/RPAV nl.   Rheumatoid arthritis (HCC) dx 2006   BILATERAL HANDS AND RIGHT ANKLE   Takotsubo cardiomyopathy    a. 11/2020 Echo: EF 20-25%, mid-apical ant, inf, and apical AK. Nl RV size/fxn. Mild MR; b. 11/2020 Cath: nonobs dzs; c. 02/2021 Echo: EF 55-60%; c. 04/2022 Echo: EF 55-60%.   Tobacco abuse    Past Surgical History:  Procedure Laterality Date   CARDIAC CATHETERIZATION     CERVICAL DISC ARTHROPLASTY N/A 12/31/2023   Procedure: CERVICAL ANTERIOR DISC ARTHROPLASTY;  Surgeon: Isabella Aures, MD;  Location: [facility] OR;  Service: Orthopedics;  Laterality: N/A;  Total disc replacement C 5-6, anterior cervical discectomy and fusion C3-4   CYSTOSCOPY WITH URETEROSCOPY, STONE BASKETRY AND STENT  PLACEMENT  2011   LEFT HEART CATH AND CORONARY ANGIOGRAPHY N/A 12/10/2020   Procedure: LEFT HEART CATH AND CORONARY ANGIOGRAPHY;  Surgeon: Isabella Burch LABOR, MD;  Location: [facility] INVASIVE CV LAB;  Service: Cardiovascular;  Laterality: N/A;   LEFT WRIST EXTENSOR TENDON REPAIR     TENDON TRANSFER Right 11/09/2014   Procedure: RIGHT THUMB EIP TO EPL TENDON TRANSFER;  Surgeon: Isabella Pagan, MD;  Location: [facility];  Service: Orthopedics;  Laterality: Right;   TUBAL LIGATION      Allergies  Allergies[1]     History of Present Illness      54 y.o. y/o female w/ the above PMH including non-obstructive CAD, takotsubo cardiomyopathy, HFimpEF, HTN, HL, anxiety, RA, and remote tob abuse.  In 11/2020, she was admitted to [facility] w/ chest pain.  ECG showed prior anteroseptal infarct and 1mm ST elevation in V1 & V2, w/ more subtle ST elevation in V3-V6.  CTA of chest was neg for PE. HsTrop rose to 3284.  Echo showed an EF of 20-25% with mid-apical anterior, inferior, and apical akinesis consistent with a stress-induced cardiomyopathy. Cath revealed mild non-obstructive dzs, and she was medically managed.  F/u echo in 02/2021 showed improvement in EF to 55-60% w/ mild MR.  In the setting of DOE, repeat echo in 04/2022 was performed and showed EF of 55-60%, GrI DD, nl RV fxn, and no significant valvular dzs.  In the Fall of 2024, she started noticing elevated  blood pressures.  Lisinopril  was titrated to 40 mg daily and metoprolol  was transitioned to carvedilol  with improvement in blood pressures.      Isabella Burch was last seen in cardiology clinic in August 2025 for preoperative clearance pending neck surgery in the setting of a whiplash injury occurred at work.  She is doing well from a cardiac standpoint and subsequently underwent cervical anterior disc arthroplasty in early September 2025.  Since then, she has had significant improvement in neck pain.  She has been having some left-sided lower back  pain which impacts her left leg and limits her walking some.  Her cardiac standpoint she has generally done well and denies chest pain, dyspnea, palpitations, PND, orthopnea, dizziness, syncope, edema, or early satiety.  She continues to use a vape which lasts her the better part of a month.  She is not really considering quitting at this time.  She is fasting this morning and overdue for lipids. Objective   Home Medications    Current Outpatient Medications  Medication Sig Dispense Refill   atorvastatin  (LIPITOR ) 40 MG tablet TAKE 1 TABLET BY MOUTH EVERY DAY 90 tablet 3   buPROPion  (WELLBUTRIN  XL) 150 MG 24 hr tablet Take 1 tablet (150 mg total) by mouth daily. 90 tablet 2   busPIRone  (BUSPAR ) 5 MG tablet TAKE 2 TABLETS BY MOUTH 2 TIMES DAILY. 360 tablet 2   carvedilol  (COREG ) 3.125 MG tablet TAKE 1 TABLET BY MOUTH 2 TIMES DAILY. -- STOP METOPROLOL  180 tablet 2   DULoxetine  (CYMBALTA ) 60 MG capsule TAKE 1 CAPSULE BY MOUTH EVERY DAY 90 capsule 2   estradiol  (ESTRACE ) 1 MG tablet Take 1 tablet (1 mg total) by mouth daily. 90 tablet 0   gabapentin  (NEURONTIN ) 300 MG capsule Take 1 capsule (300 mg total) by mouth at bedtime. 30 capsule 3   gentamicin ointment (GARAMYCIN) 0.1 % Apply 1 Application topically daily. Into nose     leflunomide  (ARAVA ) 10 MG tablet TAKE 1 TABLET BY MOUTH ONCE A DAY     lisinopril  (ZESTRIL ) 40 MG tablet TAKE 1 TABLET BY MOUTH EVERY DAY 90 tablet 3   ondansetron  (ZOFRAN ) 4 MG tablet Take 1 tablet (4 mg total) by mouth every 8 (eight) hours as needed for nausea or vomiting. 20 tablet 0   progesterone  (PROMETRIUM ) 100 MG capsule Take 1 capsule (100 mg total) by mouth daily. 90 capsule 0   XDEMVY 0.25 % SOLN in the morning and at bedtime.     hydroxychloroquine (PLAQUENIL) 200 MG tablet Take 200 mg by mouth See admin instructions. Take one tablet TWICE daily weekdays. Take one tablet ONCE daily weekends. Take with food. (Patient not taking: Reported on 04/28/2024)     No  current facility-administered medications for this visit.     Physical Exam    VS:  BP 108/60 (BP Location: Left Arm, Patient Position: Sitting, Cuff Size: Normal)   Pulse 70   Ht 5' 6 (1.676 m)   Wt 157 lb 2 oz (71.3 kg)   LMP 10/19/2020   SpO2 98%   BMI 25.36 kg/m  , BMI Body mass index is 25.36 kg/m.          GEN: Well nourished, well developed, in no acute distress. HEENT: normal. Neck: Supple, no JVD, carotid bruits, or masses. Cardiac: RRR, no murmurs, rubs, or gallops. No clubbing, cyanosis, edema.  Radials 2+/PT 2+ and equal bilaterally.  Respiratory:  Respirations regular and unlabored, clear to auscultation bilaterally. GI: Soft, nontender, nondistended, BS +  x 4. MS: no deformity or atrophy. Skin: warm and dry, no rash. Neuro:  Strength and sensation are intact. Psych: Normal affect.  Accessory Clinical Findings    ECG personally reviewed by me today - EKG Interpretation Date/Time:  Thursday April 28 2024 08:44:11 EST Ventricular Rate:  70 PR Interval:  162 QRS Duration:  70 QT Interval:  378 QTC Calculation: 408 R Axis:   40  Text Interpretation: Normal sinus rhythm Low voltage QRS Confirmed by Isabella Bruckner [id number]) on 04/28/2024 8:55:42 AM  - no acute changes.  Lab Results  Component Value Date   WBC 4.6 12/28/2023   HGB 12.4 12/28/2023   HCT 38.6 12/28/2023   MCV 96.5 12/28/2023   PLT 350 12/28/2023   Lab Results  Component Value Date   CREATININE 0.81 02/25/2024   BUN 14 02/25/2024   NA 142 02/25/2024   K 5.0 02/25/2024   CL 104 02/25/2024   CO2 25 02/25/2024   Lab Results  Component Value Date   ALT 19 02/25/2024   AST 27 02/25/2024   ALKPHOS 100 02/25/2024   BILITOT 0.2 02/25/2024   Lab Results  Component Value Date   CHOL 155 07/29/2022   HDL 56 07/29/2022   LDLCALC 80 07/29/2022   TRIG 108 07/29/2022   CHOLHDL 2.8 07/29/2022    Lab Results  Component Value Date   HGBA1C 5.8 (H) 07/29/2022   Lab Results  Component  Value Date   TSH 0.870 02/25/2024       Assessment & Plan    1.  Takotsubo cardiomyopathy/chronic heart failure with improved ejection fraction: Suffered non-STEMI in August 2022 with diagnostic catheterization showing mild, nonobstructive CAD.  EF was initially as low as 20 to 25% but recovered to 55 to 60% by echo November 2022 with stable EF of 55 to 60% by echo in 2024.  She has done well without symptoms or limitations and is euvolemic on examination.  She remains on beta-blocker and ACE inhibitor therapy.  2.  Nonobstructive CAD: Catheterization August 2022 without significant disease.  She is not having any chest pain or dyspnea.  She remains on aspirin , statin, and beta-blocker therapy.    3.  Primary hypertension: Well-controlled today at 108/60.  She does not routinely check blood pressures at home.  She remains on beta-blocker and ACE inhibitor therapy.  4.  Hyperlipidemia: LDL of 80 in early 2024.  She is overdue for lipids and is fasting this morning.  I will obtain.  LFTs were normal in late 2025.  She remains on atorvastatin  therapy.  5.  E-cigarette use: Previously smoked cigarettes but quit at the time of her event 2022.  Continues to use an e-cigarette which is lasting about a month at a time.  Complete cessation advised though she is not currently contemplating quitting.  6.  Palpitations: Quiescent.  Continue beta-blocker.  7.  Disposition: Fasting lipids today.  Follow-up in 1 year or sooner if necessary.  Bruckner Vivienne, NP 04/28/2024, 8:55 AM     [1]  Allergies Allergen Reactions   Enbrel [Etanercept] Swelling and Other (See Comments)   Humira (1 Pen) [Adalimumab] Other (See Comments)    kidney dysfunction   "

## 2024-04-30 LAB — LIPID PANEL
Chol/HDL Ratio: 2.8 ratio (ref 0.0–4.4)
Cholesterol, Total: 178 mg/dL (ref 100–199)
HDL: 63 mg/dL
LDL Chol Calc (NIH): 99 mg/dL (ref 0–99)
Triglycerides: 90 mg/dL (ref 0–149)
VLDL Cholesterol Cal: 16 mg/dL (ref 5–40)

## 2024-05-02 ENCOUNTER — Ambulatory Visit: Payer: Self-pay | Admitting: Nurse Practitioner

## 2024-05-30 ENCOUNTER — Ambulatory Visit: Admitting: Family Medicine
